# Patient Record
Sex: Female | Born: 1940
Health system: Southern US, Community
[De-identification: ages and names within clinical notes are randomized; demographics above are authoritative.]

## PROBLEM LIST (undated history)

## (undated) DIAGNOSIS — I10 Essential (primary) hypertension: Secondary | ICD-10-CM

## (undated) DIAGNOSIS — R112 Nausea with vomiting, unspecified: Secondary | ICD-10-CM

## (undated) DIAGNOSIS — E119 Type 2 diabetes mellitus without complications: Secondary | ICD-10-CM

## (undated) DIAGNOSIS — N189 Chronic kidney disease, unspecified: Secondary | ICD-10-CM

## (undated) DIAGNOSIS — R42 Dizziness and giddiness: Secondary | ICD-10-CM

## (undated) DIAGNOSIS — Z9889 Other specified postprocedural states: Secondary | ICD-10-CM

## (undated) DIAGNOSIS — T4145XA Adverse effect of unspecified anesthetic, initial encounter: Secondary | ICD-10-CM

## (undated) DIAGNOSIS — T8859XA Other complications of anesthesia, initial encounter: Secondary | ICD-10-CM

## (undated) DIAGNOSIS — M25369 Other instability, unspecified knee: Secondary | ICD-10-CM

## (undated) DIAGNOSIS — E785 Hyperlipidemia, unspecified: Secondary | ICD-10-CM

## (undated) HISTORY — DX: Essential (primary) hypertension: I10

## (undated) HISTORY — DX: Type 2 diabetes mellitus without complications: E11.9

## (undated) HISTORY — DX: Hyperlipidemia, unspecified: E78.5

## (undated) HISTORY — PX: HERNIA REPAIR: SHX51

## (undated) HISTORY — PX: CHOLECYSTECTOMY: SHX55

## (undated) HISTORY — PX: ABDOMINAL HYSTERECTOMY: SHX81

---

## 2004-06-11 ENCOUNTER — Ambulatory Visit: Payer: Self-pay

## 2005-06-19 ENCOUNTER — Ambulatory Visit: Payer: Self-pay

## 2006-08-06 ENCOUNTER — Ambulatory Visit: Payer: Self-pay | Admitting: Internal Medicine

## 2006-08-20 ENCOUNTER — Ambulatory Visit: Payer: Self-pay | Admitting: Family Medicine

## 2007-09-20 ENCOUNTER — Ambulatory Visit: Payer: Self-pay | Admitting: Family Medicine

## 2007-09-22 ENCOUNTER — Ambulatory Visit: Payer: Self-pay | Admitting: Family Medicine

## 2007-09-29 ENCOUNTER — Ambulatory Visit: Payer: Self-pay | Admitting: Nephrology

## 2007-10-27 ENCOUNTER — Ambulatory Visit: Payer: Self-pay | Admitting: Family Medicine

## 2007-11-19 ENCOUNTER — Other Ambulatory Visit: Payer: Self-pay

## 2007-11-19 ENCOUNTER — Ambulatory Visit: Payer: Self-pay | Admitting: General Surgery

## 2007-11-23 ENCOUNTER — Ambulatory Visit: Payer: Self-pay | Admitting: General Surgery

## 2008-04-20 DIAGNOSIS — N183 Chronic kidney disease, stage 3 (moderate): Secondary | ICD-10-CM

## 2008-04-20 DIAGNOSIS — N1831 Chronic kidney disease, stage 3a: Secondary | ICD-10-CM | POA: Insufficient documentation

## 2009-01-05 ENCOUNTER — Ambulatory Visit: Payer: Self-pay | Admitting: Family Medicine

## 2009-05-09 DIAGNOSIS — M8580 Other specified disorders of bone density and structure, unspecified site: Secondary | ICD-10-CM

## 2009-06-25 ENCOUNTER — Ambulatory Visit: Payer: Self-pay | Admitting: Gastroenterology

## 2009-06-25 LAB — HM COLONOSCOPY: HM COLON: NORMAL

## 2009-09-04 ENCOUNTER — Ambulatory Visit: Payer: Self-pay | Admitting: Family Medicine

## 2009-12-02 ENCOUNTER — Ambulatory Visit: Payer: Self-pay | Admitting: General Practice

## 2009-12-13 ENCOUNTER — Ambulatory Visit: Payer: Self-pay | Admitting: General Practice

## 2009-12-21 ENCOUNTER — Ambulatory Visit: Payer: Self-pay | Admitting: General Practice

## 2010-04-19 ENCOUNTER — Ambulatory Visit: Payer: Self-pay | Admitting: Family Medicine

## 2010-12-10 ENCOUNTER — Ambulatory Visit: Payer: Self-pay | Admitting: Internal Medicine

## 2010-12-13 ENCOUNTER — Ambulatory Visit: Payer: Self-pay | Admitting: Internal Medicine

## 2011-01-12 ENCOUNTER — Ambulatory Visit: Payer: Self-pay | Admitting: Internal Medicine

## 2011-02-21 ENCOUNTER — Ambulatory Visit: Payer: Self-pay

## 2011-06-12 ENCOUNTER — Ambulatory Visit: Payer: Self-pay | Admitting: Family Medicine

## 2011-08-07 DIAGNOSIS — I10 Essential (primary) hypertension: Secondary | ICD-10-CM | POA: Diagnosis not present

## 2011-08-07 DIAGNOSIS — E785 Hyperlipidemia, unspecified: Secondary | ICD-10-CM | POA: Diagnosis not present

## 2011-08-07 DIAGNOSIS — R7309 Other abnormal glucose: Secondary | ICD-10-CM | POA: Diagnosis not present

## 2011-08-07 DIAGNOSIS — N189 Chronic kidney disease, unspecified: Secondary | ICD-10-CM | POA: Diagnosis not present

## 2011-12-12 DIAGNOSIS — N189 Chronic kidney disease, unspecified: Secondary | ICD-10-CM | POA: Diagnosis not present

## 2011-12-12 DIAGNOSIS — E785 Hyperlipidemia, unspecified: Secondary | ICD-10-CM | POA: Diagnosis not present

## 2011-12-12 DIAGNOSIS — I10 Essential (primary) hypertension: Secondary | ICD-10-CM | POA: Diagnosis not present

## 2011-12-12 DIAGNOSIS — E8881 Metabolic syndrome: Secondary | ICD-10-CM | POA: Diagnosis not present

## 2012-06-16 DIAGNOSIS — I10 Essential (primary) hypertension: Secondary | ICD-10-CM | POA: Diagnosis not present

## 2012-06-16 DIAGNOSIS — D638 Anemia in other chronic diseases classified elsewhere: Secondary | ICD-10-CM | POA: Diagnosis not present

## 2012-06-16 DIAGNOSIS — R7309 Other abnormal glucose: Secondary | ICD-10-CM | POA: Diagnosis not present

## 2012-06-16 DIAGNOSIS — Z23 Encounter for immunization: Secondary | ICD-10-CM | POA: Diagnosis not present

## 2012-06-16 DIAGNOSIS — D649 Anemia, unspecified: Secondary | ICD-10-CM | POA: Diagnosis not present

## 2012-06-16 DIAGNOSIS — M1710 Unilateral primary osteoarthritis, unspecified knee: Secondary | ICD-10-CM | POA: Diagnosis not present

## 2012-06-16 DIAGNOSIS — E785 Hyperlipidemia, unspecified: Secondary | ICD-10-CM | POA: Diagnosis not present

## 2012-10-15 DIAGNOSIS — E785 Hyperlipidemia, unspecified: Secondary | ICD-10-CM | POA: Diagnosis not present

## 2012-10-15 DIAGNOSIS — I10 Essential (primary) hypertension: Secondary | ICD-10-CM | POA: Diagnosis not present

## 2012-10-15 DIAGNOSIS — E8881 Metabolic syndrome: Secondary | ICD-10-CM | POA: Diagnosis not present

## 2012-10-15 DIAGNOSIS — N189 Chronic kidney disease, unspecified: Secondary | ICD-10-CM | POA: Diagnosis not present

## 2012-10-15 DIAGNOSIS — D638 Anemia in other chronic diseases classified elsewhere: Secondary | ICD-10-CM | POA: Diagnosis not present

## 2013-02-21 DIAGNOSIS — E8881 Metabolic syndrome: Secondary | ICD-10-CM | POA: Diagnosis not present

## 2013-02-21 DIAGNOSIS — E785 Hyperlipidemia, unspecified: Secondary | ICD-10-CM | POA: Diagnosis not present

## 2013-02-21 DIAGNOSIS — I10 Essential (primary) hypertension: Secondary | ICD-10-CM | POA: Diagnosis not present

## 2013-02-21 DIAGNOSIS — N189 Chronic kidney disease, unspecified: Secondary | ICD-10-CM | POA: Diagnosis not present

## 2013-04-29 DIAGNOSIS — Z23 Encounter for immunization: Secondary | ICD-10-CM | POA: Diagnosis not present

## 2013-06-24 DIAGNOSIS — E785 Hyperlipidemia, unspecified: Secondary | ICD-10-CM | POA: Diagnosis not present

## 2013-06-24 DIAGNOSIS — R7309 Other abnormal glucose: Secondary | ICD-10-CM | POA: Diagnosis not present

## 2013-06-24 DIAGNOSIS — N189 Chronic kidney disease, unspecified: Secondary | ICD-10-CM | POA: Diagnosis not present

## 2013-06-24 DIAGNOSIS — I10 Essential (primary) hypertension: Secondary | ICD-10-CM | POA: Diagnosis not present

## 2013-06-24 DIAGNOSIS — Z23 Encounter for immunization: Secondary | ICD-10-CM | POA: Diagnosis not present

## 2013-10-06 ENCOUNTER — Ambulatory Visit: Payer: Self-pay | Admitting: Family Medicine

## 2013-10-06 DIAGNOSIS — Z1231 Encounter for screening mammogram for malignant neoplasm of breast: Secondary | ICD-10-CM | POA: Diagnosis not present

## 2013-10-06 LAB — HM MAMMOGRAPHY: HM Mammogram: NORMAL

## 2013-10-25 DIAGNOSIS — I10 Essential (primary) hypertension: Secondary | ICD-10-CM | POA: Diagnosis not present

## 2013-10-25 DIAGNOSIS — E785 Hyperlipidemia, unspecified: Secondary | ICD-10-CM | POA: Diagnosis not present

## 2013-10-25 DIAGNOSIS — E8881 Metabolic syndrome: Secondary | ICD-10-CM | POA: Diagnosis not present

## 2013-10-25 DIAGNOSIS — N189 Chronic kidney disease, unspecified: Secondary | ICD-10-CM | POA: Diagnosis not present

## 2013-10-25 LAB — LIPID PANEL
Cholesterol: 201 mg/dL — AB (ref 0–200)
HDL: 119 mg/dL — AB (ref 35–70)
LDL Cholesterol: 74 mg/dL
TRIGLYCERIDES: 38 mg/dL — AB (ref 40–160)

## 2013-12-14 DIAGNOSIS — E119 Type 2 diabetes mellitus without complications: Secondary | ICD-10-CM | POA: Diagnosis not present

## 2013-12-14 DIAGNOSIS — N183 Chronic kidney disease, stage 3 unspecified: Secondary | ICD-10-CM | POA: Diagnosis not present

## 2013-12-14 DIAGNOSIS — I129 Hypertensive chronic kidney disease with stage 1 through stage 4 chronic kidney disease, or unspecified chronic kidney disease: Secondary | ICD-10-CM | POA: Diagnosis not present

## 2013-12-16 DIAGNOSIS — H35379 Puckering of macula, unspecified eye: Secondary | ICD-10-CM | POA: Diagnosis not present

## 2014-02-24 DIAGNOSIS — Z23 Encounter for immunization: Secondary | ICD-10-CM | POA: Diagnosis not present

## 2014-02-24 DIAGNOSIS — I10 Essential (primary) hypertension: Secondary | ICD-10-CM | POA: Diagnosis not present

## 2014-02-24 DIAGNOSIS — E1129 Type 2 diabetes mellitus with other diabetic kidney complication: Secondary | ICD-10-CM | POA: Diagnosis not present

## 2014-02-24 DIAGNOSIS — E785 Hyperlipidemia, unspecified: Secondary | ICD-10-CM | POA: Diagnosis not present

## 2014-03-17 DIAGNOSIS — H43819 Vitreous degeneration, unspecified eye: Secondary | ICD-10-CM | POA: Diagnosis not present

## 2014-03-17 DIAGNOSIS — H35379 Puckering of macula, unspecified eye: Secondary | ICD-10-CM | POA: Diagnosis not present

## 2014-04-20 ENCOUNTER — Ambulatory Visit: Payer: Self-pay | Admitting: Ophthalmology

## 2014-04-20 DIAGNOSIS — Z01812 Encounter for preprocedural laboratory examination: Secondary | ICD-10-CM | POA: Diagnosis not present

## 2014-04-20 DIAGNOSIS — Z01810 Encounter for preprocedural cardiovascular examination: Secondary | ICD-10-CM | POA: Diagnosis not present

## 2014-04-20 DIAGNOSIS — I10 Essential (primary) hypertension: Secondary | ICD-10-CM | POA: Diagnosis not present

## 2014-04-20 LAB — POTASSIUM: Potassium: 4.1 mmol/L (ref 3.5–5.1)

## 2014-04-26 ENCOUNTER — Ambulatory Visit: Payer: Self-pay | Admitting: Ophthalmology

## 2014-04-26 DIAGNOSIS — Z7982 Long term (current) use of aspirin: Secondary | ICD-10-CM | POA: Diagnosis not present

## 2014-04-26 DIAGNOSIS — I10 Essential (primary) hypertension: Secondary | ICD-10-CM | POA: Diagnosis not present

## 2014-04-26 DIAGNOSIS — Z79899 Other long term (current) drug therapy: Secondary | ICD-10-CM | POA: Diagnosis not present

## 2014-04-26 DIAGNOSIS — M1990 Unspecified osteoarthritis, unspecified site: Secondary | ICD-10-CM | POA: Diagnosis not present

## 2014-04-26 DIAGNOSIS — E119 Type 2 diabetes mellitus without complications: Secondary | ICD-10-CM | POA: Diagnosis not present

## 2014-04-26 DIAGNOSIS — N289 Disorder of kidney and ureter, unspecified: Secondary | ICD-10-CM | POA: Diagnosis not present

## 2014-04-26 DIAGNOSIS — H35372 Puckering of macula, left eye: Secondary | ICD-10-CM | POA: Diagnosis not present

## 2014-04-26 DIAGNOSIS — H35342 Macular cyst, hole, or pseudohole, left eye: Secondary | ICD-10-CM | POA: Diagnosis not present

## 2014-04-26 DIAGNOSIS — M810 Age-related osteoporosis without current pathological fracture: Secondary | ICD-10-CM | POA: Diagnosis not present

## 2014-04-26 DIAGNOSIS — E780 Pure hypercholesterolemia: Secondary | ICD-10-CM | POA: Diagnosis not present

## 2014-05-03 DIAGNOSIS — H35372 Puckering of macula, left eye: Secondary | ICD-10-CM | POA: Diagnosis not present

## 2014-08-11 DIAGNOSIS — F410 Panic disorder [episodic paroxysmal anxiety] without agoraphobia: Secondary | ICD-10-CM | POA: Diagnosis not present

## 2014-08-11 DIAGNOSIS — N183 Chronic kidney disease, stage 3 (moderate): Secondary | ICD-10-CM | POA: Diagnosis not present

## 2014-08-11 DIAGNOSIS — E1121 Type 2 diabetes mellitus with diabetic nephropathy: Secondary | ICD-10-CM | POA: Diagnosis not present

## 2014-08-11 DIAGNOSIS — I10 Essential (primary) hypertension: Secondary | ICD-10-CM | POA: Diagnosis not present

## 2014-08-11 DIAGNOSIS — D638 Anemia in other chronic diseases classified elsewhere: Secondary | ICD-10-CM | POA: Diagnosis not present

## 2014-08-11 DIAGNOSIS — E785 Hyperlipidemia, unspecified: Secondary | ICD-10-CM | POA: Diagnosis not present

## 2014-08-14 DIAGNOSIS — N183 Chronic kidney disease, stage 3 (moderate): Secondary | ICD-10-CM | POA: Diagnosis not present

## 2014-08-14 DIAGNOSIS — H35372 Puckering of macula, left eye: Secondary | ICD-10-CM | POA: Diagnosis not present

## 2014-08-14 DIAGNOSIS — I10 Essential (primary) hypertension: Secondary | ICD-10-CM | POA: Diagnosis not present

## 2014-08-14 DIAGNOSIS — E1121 Type 2 diabetes mellitus with diabetic nephropathy: Secondary | ICD-10-CM | POA: Diagnosis not present

## 2014-08-14 DIAGNOSIS — E785 Hyperlipidemia, unspecified: Secondary | ICD-10-CM | POA: Diagnosis not present

## 2014-08-14 LAB — HEMOGLOBIN A1C: Hgb A1c MFr Bld: 6.1 % — AB (ref 4.0–6.0)

## 2014-08-21 DIAGNOSIS — R51 Headache: Secondary | ICD-10-CM | POA: Diagnosis not present

## 2014-08-21 DIAGNOSIS — R42 Dizziness and giddiness: Secondary | ICD-10-CM | POA: Diagnosis not present

## 2014-09-01 DIAGNOSIS — H905 Unspecified sensorineural hearing loss: Secondary | ICD-10-CM | POA: Diagnosis not present

## 2014-09-01 DIAGNOSIS — J301 Allergic rhinitis due to pollen: Secondary | ICD-10-CM | POA: Diagnosis not present

## 2014-09-01 DIAGNOSIS — R42 Dizziness and giddiness: Secondary | ICD-10-CM | POA: Diagnosis not present

## 2014-09-01 DIAGNOSIS — H903 Sensorineural hearing loss, bilateral: Secondary | ICD-10-CM | POA: Diagnosis not present

## 2014-09-01 DIAGNOSIS — H9319 Tinnitus, unspecified ear: Secondary | ICD-10-CM | POA: Diagnosis not present

## 2014-11-04 NOTE — Op Note (Signed)
PATIENT NAME:  Melanie Gray, Melanie Gray DATE OF BIRTH:  [birth year]  DATE OF PROCEDURE:  04/26/2014  PREOPERATIVE DIAGNOSIS: Epiretinal membrane, left eye.   POSTOPERATIVE DIAGNOSIS: Epiretinal membrane, left eye.   PROCEDURE: A 25-gauge pars plana vitrectomy, ICG, ERM, ILM peel, and partial air-fluid exchange, left eye.   ANESTHESIA: General.   ESTIMATED BLOOD LOSS: Minimal.   COMPLICATIONS: None.  DESCRIPTION OF PROCEDURE: The patient was evaluated in clinic for an epiretinal membrane in the left eye. The risks, benefits, alternatives, and complications were discussed with the patient and decision was made to proceed with epiretinal membrane removal surgery. The patient was greeted in the preoperative holding area. Any questions were answered. The site was marked. The patient was brought to the Operating Room in supine position and placed under general anesthesia. The left eye was then prepped and draped in the usual sterile fashion. A 25-gauge trocar system was placed in the usual position and the infusion cannula was checked prior to starting the infusion. That was in the vitreous cavity. A core vitrectomy was performed with elevation of the posterior hyaloid and the vitreous was shortened 360 degrees. ICG was then applied to the retinal surface followed by aspiration of the ICG. Attention was then drawn to the epiretinal membrane, which was peeled along with the internal limiting membrane for an area of about 1 disk diameter 360 degrees around the fovea. The periphery was then inspected using a wide angle viewing system. There were no retinal tears 360 degrees. A partial air-fluid exchange was then performed and the eye was left with a partial air bubble. The trocars were then removed and ensured to be watertight. A peribulbar block was given along with subconjunctival cefuroxime and dexamethasone. The eye was then patched and shielded with Neo-Poly-Dex ointment and the patient was brought out  of general anesthesia and taken to recovery area in stable condition.     ____________________________ Princella PellegriniJessica D. Duke Salviaandolph, MD jdr:at D: 04/26/2014 10:20:31 ET T: 04/26/2014 14:14:40 ET JOB#: [id number]  cc: Shanda BumpsJessica D. Duke Salviaandolph, MD, <Dictator> Dartha LodgeJESSICA D [place] MD ELECTRONICALLY SIGNED 05/10/2014 7:41

## 2014-11-06 ENCOUNTER — Encounter: Payer: Self-pay | Admitting: Family Medicine

## 2014-11-06 DIAGNOSIS — L219 Seborrheic dermatitis, unspecified: Secondary | ICD-10-CM | POA: Insufficient documentation

## 2014-11-06 DIAGNOSIS — K7689 Other specified diseases of liver: Secondary | ICD-10-CM | POA: Insufficient documentation

## 2014-11-06 DIAGNOSIS — R51 Headache: Secondary | ICD-10-CM

## 2014-11-06 DIAGNOSIS — I10 Essential (primary) hypertension: Secondary | ICD-10-CM | POA: Insufficient documentation

## 2014-11-06 DIAGNOSIS — E785 Hyperlipidemia, unspecified: Secondary | ICD-10-CM | POA: Insufficient documentation

## 2014-11-06 DIAGNOSIS — E1129 Type 2 diabetes mellitus with other diabetic kidney complication: Secondary | ICD-10-CM | POA: Insufficient documentation

## 2014-11-06 DIAGNOSIS — IMO0001 Reserved for inherently not codable concepts without codable children: Secondary | ICD-10-CM | POA: Insufficient documentation

## 2014-11-06 DIAGNOSIS — D649 Anemia, unspecified: Secondary | ICD-10-CM | POA: Insufficient documentation

## 2014-11-06 DIAGNOSIS — H905 Unspecified sensorineural hearing loss: Secondary | ICD-10-CM | POA: Insufficient documentation

## 2014-11-06 DIAGNOSIS — R519 Headache, unspecified: Secondary | ICD-10-CM | POA: Insufficient documentation

## 2014-11-06 DIAGNOSIS — R42 Dizziness and giddiness: Secondary | ICD-10-CM | POA: Insufficient documentation

## 2014-11-06 DIAGNOSIS — F410 Panic disorder [episodic paroxysmal anxiety] without agoraphobia: Secondary | ICD-10-CM | POA: Insufficient documentation

## 2014-11-06 DIAGNOSIS — J309 Allergic rhinitis, unspecified: Secondary | ICD-10-CM | POA: Insufficient documentation

## 2014-11-06 DIAGNOSIS — M4800 Spinal stenosis, site unspecified: Secondary | ICD-10-CM | POA: Insufficient documentation

## 2014-11-06 DIAGNOSIS — M1710 Unilateral primary osteoarthritis, unspecified knee: Secondary | ICD-10-CM | POA: Insufficient documentation

## 2014-11-06 DIAGNOSIS — M179 Osteoarthritis of knee, unspecified: Secondary | ICD-10-CM | POA: Insufficient documentation

## 2014-11-06 DIAGNOSIS — H9313 Tinnitus, bilateral: Secondary | ICD-10-CM | POA: Insufficient documentation

## 2014-11-06 DIAGNOSIS — G2581 Restless legs syndrome: Secondary | ICD-10-CM | POA: Insufficient documentation

## 2014-11-22 DIAGNOSIS — J302 Other seasonal allergic rhinitis: Secondary | ICD-10-CM | POA: Diagnosis not present

## 2014-11-22 DIAGNOSIS — E1121 Type 2 diabetes mellitus with diabetic nephropathy: Secondary | ICD-10-CM | POA: Diagnosis not present

## 2014-11-22 DIAGNOSIS — N183 Chronic kidney disease, stage 3 (moderate): Secondary | ICD-10-CM | POA: Diagnosis not present

## 2014-11-22 DIAGNOSIS — E785 Hyperlipidemia, unspecified: Secondary | ICD-10-CM | POA: Diagnosis not present

## 2014-11-22 DIAGNOSIS — I10 Essential (primary) hypertension: Secondary | ICD-10-CM | POA: Diagnosis not present

## 2014-11-22 DIAGNOSIS — Z23 Encounter for immunization: Secondary | ICD-10-CM | POA: Diagnosis not present

## 2015-03-20 ENCOUNTER — Other Ambulatory Visit: Payer: Self-pay | Admitting: Family Medicine

## 2015-03-26 ENCOUNTER — Encounter: Payer: Self-pay | Admitting: Family Medicine

## 2015-03-26 ENCOUNTER — Ambulatory Visit (INDEPENDENT_AMBULATORY_CARE_PROVIDER_SITE_OTHER): Payer: Medicare Other | Admitting: Family Medicine

## 2015-03-26 VITALS — BP 128/68 | HR 83 | Temp 97.6°F | Resp 16 | Ht 65.0 in | Wt 151.7 lb

## 2015-03-26 DIAGNOSIS — Z23 Encounter for immunization: Secondary | ICD-10-CM

## 2015-03-26 DIAGNOSIS — N189 Chronic kidney disease, unspecified: Secondary | ICD-10-CM | POA: Diagnosis not present

## 2015-03-26 DIAGNOSIS — E785 Hyperlipidemia, unspecified: Secondary | ICD-10-CM

## 2015-03-26 DIAGNOSIS — N183 Chronic kidney disease, stage 3 unspecified: Secondary | ICD-10-CM

## 2015-03-26 DIAGNOSIS — E1122 Type 2 diabetes mellitus with diabetic chronic kidney disease: Secondary | ICD-10-CM

## 2015-03-26 DIAGNOSIS — M170 Bilateral primary osteoarthritis of knee: Secondary | ICD-10-CM | POA: Diagnosis not present

## 2015-03-26 DIAGNOSIS — I10 Essential (primary) hypertension: Secondary | ICD-10-CM

## 2015-03-26 LAB — POCT UA - MICROALBUMIN

## 2015-03-26 LAB — POCT GLYCOSYLATED HEMOGLOBIN (HGB A1C): Hemoglobin A1C: 6.1

## 2015-03-26 MED ORDER — PRAVASTATIN SODIUM 40 MG PO TABS: 40.0000 mg | ORAL_TABLET | Freq: Every day | ORAL | Status: DC

## 2015-03-26 MED ORDER — VALSARTAN-HYDROCHLOROTHIAZIDE 160-12.5 MG PO TABS
1.0000 | ORAL_TABLET | Freq: Every day | ORAL | Status: DC
Start: 2015-03-26 — End: 2015-10-12

## 2015-03-26 NOTE — Progress Notes (Signed)
Name: Melanie Gray   MRN: [id number]    DOB: Jan 11, 1941   Date:03/26/2015       Progress Note  Subjective  Chief Complaint  Chief Complaint  Patient presents with  . Medication Refill    follow-up  . Hypertension  . Hyperlipidemia    HPI  DMII with CKI III: she has been doing well, no polyphagia, polydipsia or polyuria, glucose at home has been at goal, around 90-105  at home. On diet only. Also takes aspirin daily.   CKIIII she sees nephrologist yearly now. We will recheck urine micro and CBC, SMA-7 today, also vitamin D    Hyperlipidemia: taking Pravastatin and denies side effects  HTN: taking medication, denies side effects of medication. No dizziness, except when she looks up for a prolonged period of time.   OA: worse on right knee, uses a cane to assist with ambulation.   Patient Active Problem List   Diagnosis Date Noted  . Benign hypertension 11/06/2014  . Chronic anemia 11/06/2014  . Seborrheic dermatitis 11/06/2014  . Diabetes mellitus with renal manifestation 11/06/2014  . Dyslipidemia 11/06/2014  . Cephalalgia 11/06/2014  . Deafness, sensorineural 11/06/2014  . Liver regeneration 11/06/2014  . Arthritis of knee, degenerative 11/06/2014  . Panic attack 11/06/2014  . Restless legs syndrome 11/06/2014  . Allergic rhinitis 11/06/2014  . Spinal stenosis 11/06/2014  . Osteopenia 05/09/2009  . Chronic kidney disease (CKD), stage III (moderate) 04/20/2008    No past surgical history on file.  No family history on file.  Social History   Social History  . Marital Status: Widowed    Spouse Name: N/A  . Number of Children: N/A  . Years of Education: N/A   Occupational History  . unemployed    Social History Main Topics  . Smoking status: Never Smoker   . Smokeless tobacco: Never Used  . Alcohol Use: No  . Drug Use: No  . Sexual Activity: No   Other Topics Concern  . Not on file   Social History Narrative     Current outpatient prescriptions:  .   aspirin EC 81 MG tablet, Take 81 mg by mouth daily., Disp: , Rfl:  .  Cholecalciferol (VITAMIN D) 2000 UNITS tablet, Take 1 tablet by mouth daily., Disp: , Rfl:  .  Glucosamine-Chondroitin 250-200 MG TABS, Take 1 tablet by mouth daily., Disp: , Rfl:  .  Multiple Vitamin (MULTI-VITAMINS) TABS, Take 1 tablet by mouth daily., Disp: , Rfl:  .  pravastatin (PRAVACHOL) 40 MG tablet, Take 1 tablet (40 mg total) by mouth daily., Disp: 90 tablet, Rfl: 1 .  valsartan-hydrochlorothiazide (DIOVAN-HCT) 160-12.5 MG per tablet, Take 1 tablet by mouth daily., Disp: 90 tablet, Rfl: 1  Allergies  Allergen Reactions  . Sulfa Antibiotics Rash     ROS  Constitutional: Negative for fever or significant weight change.  Respiratory: Negative for cough and shortness of breath.   Cardiovascular: Negative for chest pain or palpitations.  Gastrointestinal: Negative for abdominal pain, no bowel changes.  Musculoskeletal: Positive  for gait problem or joint swelling.  Skin: Negative for rash.  Neurological: Negative for dizziness or headache.  No other specific complaints in a complete review of systems (except as listed in HPI above).  Objective  Filed Vitals:   03/26/15 1007  BP: 128/68  Pulse: 83  Temp: 97.6 F (36.4 C)  TempSrc: Oral  Resp: 16  Height: 5\' 5"  (1.651 m)  Weight: 151 lb 11.2 oz (68.811 kg)  SpO2: 98%  Body mass index is 25.24 kg/(m^2).  Physical Exam  Constitutional: Patient appears well-developed and well-nourished. Obese  No distress.  HEENT: head atraumatic, normocephalic, pupils equal and reactive to light, neck supple, throat within normal limits Cardiovascular: Normal rate, regular rhythm and normal heart sounds.  No murmur heard. No BLE edema. Pulmonary/Chest: Effort normal and breath sounds normal. No respiratory distress. Abdominal: Soft.  There is no tenderness. Psychiatric: Patient has a normal mood and affect. behavior is normal. Judgment and thought content  normal. Muscular Skeletal: crepitus with extension of both knees, no effusion, uses cane to assist with ambulation  Diabetic Foot Exam: Diabetic Foot Exam - Simple   Simple Foot Form  Visual Inspection  See comments:  Yes  Sensation Testing  Intact to touch and monofilament testing bilaterally:  Yes  Pulse Check  Posterior Tibialis and Dorsalis pulse intact bilaterally:  Yes  Comments  Corn formation, thick toenails.        PHQ2/9: Depression screen PHQ 2/9 03/26/2015  Decreased Interest 0  Down, Depressed, Hopeless 0  PHQ - 2 Score 0    Fall Risk: Fall Risk  03/26/2015  Falls in the past year? Yes  Number falls in past yr: 1  Injury with Fall? No     Functional Status Survey: Is the patient deaf or have difficulty hearing?: No Does the patient have difficulty seeing, even when wearing glasses/contacts?: Yes (glassses) Does the patient have difficulty concentrating, remembering, or making decisions?: No Does the patient have difficulty walking or climbing stairs?: No Does the patient have difficulty dressing or bathing?: No Does the patient have difficulty doing errands alone such as visiting a doctor's office or shopping?: No   Assessment & Plan  1. Type 2 diabetes mellitus with diabetic chronic kidney disease Continue diet, hgbA1C is at goal at 6.1 - POCT HgB A1C  2. Needs flu shot  - Flu vaccine HIGH DOSE PF (Fluzone High dose)  3. Primary osteoarthritis of both knees Not significant pain, just has instability and uses cane  4. Dyslipidemia Recheck yearly  - pravastatin (PRAVACHOL) 40 MG tablet; Take 1 tablet (40 mg total) by mouth daily.  Dispense: 90 tablet; Refill: 1  5. Chronic kidney disease (CKD), stage III (moderate) -comp panel - CBC with Differential/Platelet - Vit D  25 hydroxy (rtn osteoporosis monitoring)  6. Need for pneumococcal vaccination  - Pneumococcal conjugate vaccine 13-valent IM  7. Hypertension, benign At goal, denies  orthostatic changes - valsartan-hydrochlorothiazide (DIOVAN-HCT) 160-12.5 MG per tablet; Take 1 tablet by mouth daily.  Dispense: 90 tablet; Refill: 1 - Comprehensive metabolic panel

## 2015-03-26 NOTE — Addendum Note (Signed)
Addended by: Marcos Eke C on: 03/26/2015 11:11 AM   Modules accepted: Orders

## 2015-03-27 LAB — COMPREHENSIVE METABOLIC PANEL
A/G RATIO: 1.6 (ref 1.1–2.5)
ALBUMIN: 4.5 g/dL (ref 3.5–4.8)
ALT: 15 IU/L (ref 0–32)
AST: 22 IU/L (ref 0–40)
Alkaline Phosphatase: 85 IU/L (ref 39–117)
BUN / CREAT RATIO: 21 (ref 11–26)
BUN: 29 mg/dL — ABNORMAL HIGH (ref 8–27)
Bilirubin Total: 0.4 mg/dL (ref 0.0–1.2)
CALCIUM: 10 mg/dL (ref 8.7–10.3)
CO2: 23 mmol/L (ref 18–29)
Chloride: 105 mmol/L (ref 97–108)
Creatinine, Ser: 1.35 mg/dL — ABNORMAL HIGH (ref 0.57–1.00)
GFR, EST AFRICAN AMERICAN: 44 mL/min/{1.73_m2} — AB (ref >59)
GFR, EST NON AFRICAN AMERICAN: 38 mL/min/{1.73_m2} — AB (ref >59)
GLOBULIN, TOTAL: 2.9 g/dL (ref 1.5–4.5)
Glucose: 109 mg/dL — ABNORMAL HIGH (ref 65–99)
POTASSIUM: 4.9 mmol/L (ref 3.5–5.2)
SODIUM: 145 mmol/L — AB (ref 134–144)
TOTAL PROTEIN: 7.4 g/dL (ref 6.0–8.5)

## 2015-03-27 LAB — CBC WITH DIFFERENTIAL/PLATELET
Basophils Absolute: 0 x10E3/uL (ref 0.0–0.2)
Basos: 0 %
EOS (ABSOLUTE): 0 x10E3/uL (ref 0.0–0.4)
Eos: 1 %
Hematocrit: 35.2 % (ref 34.0–46.6)
Hemoglobin: 11.6 g/dL (ref 11.1–15.9)
Immature Grans (Abs): 0 x10E3/uL (ref 0.0–0.1)
Immature Granulocytes: 0 %
Lymphocytes Absolute: 1.6 x10E3/uL (ref 0.7–3.1)
Lymphs: 28 %
MCH: 28.2 pg (ref 26.6–33.0)
MCHC: 33 g/dL (ref 31.5–35.7)
MCV: 86 fL (ref 79–97)
Monocytes Absolute: 0.4 x10E3/uL (ref 0.1–0.9)
Monocytes: 8 %
Neutrophils Absolute: 3.5 x10E3/uL (ref 1.4–7.0)
Neutrophils: 63 %
Platelets: 241 x10E3/uL (ref 150–379)
RBC: 4.11 x10E6/uL (ref 3.77–5.28)
RDW: 14.9 % (ref 12.3–15.4)
WBC: 5.5 x10E3/uL (ref 3.4–10.8)

## 2015-03-27 LAB — VITAMIN D 25 HYDROXY (VIT D DEFICIENCY, FRACTURES): Vit D, 25-Hydroxy: 38.9 ng/mL (ref 30.0–100.0)

## 2015-03-30 NOTE — Progress Notes (Signed)
Patient notified

## 2015-07-27 ENCOUNTER — Encounter: Payer: Self-pay | Admitting: Family Medicine

## 2015-07-27 ENCOUNTER — Ambulatory Visit (INDEPENDENT_AMBULATORY_CARE_PROVIDER_SITE_OTHER): Payer: Medicare Other | Admitting: Family Medicine

## 2015-07-27 VITALS — BP 118/72 | HR 95 | Temp 97.6°F | Resp 16 | Wt 149.6 lb

## 2015-07-27 DIAGNOSIS — I10 Essential (primary) hypertension: Secondary | ICD-10-CM

## 2015-07-27 DIAGNOSIS — Z0000 Encounter for general adult medical examination without abnormal findings: Secondary | ICD-10-CM

## 2015-07-27 DIAGNOSIS — N183 Chronic kidney disease, stage 3 (moderate): Secondary | ICD-10-CM | POA: Diagnosis not present

## 2015-07-27 DIAGNOSIS — Z1239 Encounter for other screening for malignant neoplasm of breast: Secondary | ICD-10-CM | POA: Diagnosis not present

## 2015-07-27 DIAGNOSIS — E1122 Type 2 diabetes mellitus with diabetic chronic kidney disease: Secondary | ICD-10-CM

## 2015-07-27 DIAGNOSIS — E2839 Other primary ovarian failure: Secondary | ICD-10-CM | POA: Diagnosis not present

## 2015-07-27 LAB — POCT GLYCOSYLATED HEMOGLOBIN (HGB A1C): Hemoglobin A1C: 5.5

## 2015-07-27 NOTE — Progress Notes (Signed)
Name: Melanie Gray   MRN: [id number]    DOB: [birth year]   Date:07/27/2015       Progress Note  Subjective  Chief Complaint  Chief Complaint  Patient presents with  . Medicare Wellness    HPI  Functional ability/safety issues: No Issues Hearing issues: Addressed  Activities of daily living: Discussed Home safety issues: No Issues  End Of Life Planning: Offered verbal information regarding advanced directives, healthcare power of attorney.  Preventative care, Health maintenance, Preventative health measures discussed.  Preventative screenings discussed today: lab work, colonoscopy, PAP, mammogram, DEXA.  Low Dose CT Chest recommended if Age 75-80 years, 30 pack-year currently smoking OR have quit w/in 15years.   Lifestyle risk factor issued reviewed: Diet, exercise, weight management, advised patient smoking is not healthy, nutrition/diet.  Preventative health measures discussed (5-10 year plan).  Reviewed and recommended vaccinations: - Pneumovax  - Prevnar  - Annual Influenza - Zostavax - Tdap   Depression screening: Done Fall risk screening: Done Discuss ADLs/IADLs: Done  Current medical providers: See HPI  Other health risk factors identified this visit: No other issues Cognitive impairment issues: None identified  All above discussed with patient. Appropriate education, counseling and referral will be made based upon the above.   HTN: she asked to change from ACE to ARB and Valsartan/HCTZ but is too expensive, she will check with pharmacist what would be cheaper. She denies side effects of medication, no chest pain or palpitation.   Diabetes with renal manifestation: GFR stable, on ARB, she denies polyphagia, polydipsia or polyuria. She is on diet only. We will get copy of eye exam from [facility].   Hyperlipidemia: taking Pravastatin, denies myalgia , compliant with medication. Reviewed last labs with patient today    Patient Active Problem List   Diagnosis Date Noted  . Benign hypertension 11/06/2014  . Chronic anemia 11/06/2014  . Seborrheic dermatitis 11/06/2014  . Diabetes mellitus with renal manifestation (HCC) 11/06/2014  . Dyslipidemia 11/06/2014  . Cephalalgia 11/06/2014  . Deafness, sensorineural 11/06/2014  . Liver regeneration 11/06/2014  . Arthritis of knee, degenerative 11/06/2014  . Panic attack 11/06/2014  . Restless legs syndrome 11/06/2014  . Allergic rhinitis 11/06/2014  . Spinal stenosis 11/06/2014  . Osteopenia 05/09/2009  . Chronic kidney disease (CKD), stage III (moderate) 04/20/2008    No past surgical history on file.  No family history on file.  Social History   Social History  . Marital Status: Widowed    Spouse Name: N/A  . Number of Children: N/A  . Years of Education: N/A   Occupational History  . unemployed    Social History Main Topics  . Smoking status: Never Smoker   . Smokeless tobacco: Never Used  . Alcohol Use: No  . Drug Use: No  . Sexual Activity: No   Other Topics Concern  . Not on file   Social History Narrative     Current outpatient prescriptions:  .  aspirin EC 81 MG tablet, Take 81 mg by mouth daily., Disp: , Rfl:  .  Cholecalciferol (VITAMIN D) 2000 UNITS tablet, Take 1 tablet by mouth daily., Disp: , Rfl:  .  Glucosamine-Chondroitin 250-200 MG TABS, Take 1 tablet by mouth daily., Disp: , Rfl:  .  Multiple Vitamin (MULTI-VITAMINS) TABS, Take 1 tablet by mouth daily., Disp: , Rfl:  .  pravastatin (PRAVACHOL) 40 MG tablet, Take 1 tablet (40 mg total) by mouth daily., Disp: 90 tablet, Rfl: 1 .  valsartan-hydrochlorothiazide (DIOVAN-HCT) 160-12.5 MG per  tablet, Take 1 tablet by mouth daily., Disp: 90 tablet, Rfl: 1  Allergies  Allergen Reactions  . Sulfa Antibiotics Rash     ROS  Constitutional: Negative for fever or significant weight change.  Respiratory: Negative for cough and shortness of breath.   Cardiovascular: Negative for chest pain or  palpitations.  Gastrointestinal: Negative for abdominal pain, no bowel changes.  Musculoskeletal: Positive  for gait problem no  joint swelling.  Skin: Negative for rash.  Neurological: Negative for dizziness or headache.  No other specific complaints in a complete review of systems (except as listed in HPI above).  Objective  Filed Vitals:   07/27/15 1059  BP: 118/72  Pulse: 95  Temp: 97.6 F (36.4 C)  TempSrc: Oral  Resp: 16  Weight: 149 lb 9.6 oz (67.858 kg)  SpO2: 97%    Body mass index is 24.89 kg/(m^2).  Physical Exam  Constitutional: Patient appears well-developed and well-nourished. No distress.  HENT: Head: Normocephalic and atraumatic. Ears: B TMs ok, no erythema or effusion; Nose: Nose normal. Mouth/Throat: Oropharynx is clear and moist. No oropharyngeal exudate.  Eyes: Conjunctivae and EOM are normal. Pupils are equal, round, and reactive to light. No scleral icterus.  Neck: Normal range of motion. Neck supple. No JVD present. No thyromegaly present.  Cardiovascular: Normal rate, regular rhythm and normal heart sounds.  No murmur heard. No BLE edema. Pulmonary/Chest: Effort normal and breath sounds normal. No respiratory distress. Abdominal: Soft. Bowel sounds are normal, no distension. There is no tenderness. no masses Breast: no lumps or masses, no nipple discharge or rashes FEMALE GENITALIA:  Not done RECTAL: not done Musculoskeletal: Normal range of motion, no joint effusions. No gross deformities Neurological: he is alert and oriented to person, place, and time. No cranial nerve deficit. Coordination, balance compromised because of her decrease in strenth, she uses a cane to assist with her gait Skin: Skin is warm and dry. No rash noted. No erythema.  Psychiatric: Patient has a normal mood and affect. behavior is normal. Judgment and thought content normal.  PHQ2/9: Depression screen [facility] 2/9 07/27/2015 03/26/2015  Decreased Interest 0 0  Down, Depressed,  Hopeless 0 0  PHQ - 2 Score 0 0     Fall Risk: Fall Risk  07/27/2015 03/26/2015  Falls in the past year? Yes Yes  Number falls in past yr: 1 1  Injury with Fall? No No  Risk for fall due to : Impaired balance/gait -     Functional Status Survey: Is the patient deaf or have difficulty hearing?: No Does the patient have difficulty seeing, even when wearing glasses/contacts?: Yes (patient has to have cataract surgery (seen at [facility])) Does the patient have difficulty concentrating, remembering, or making decisions?: No Does the patient have difficulty walking or climbing stairs?: Yes (patient has issues with right knee) Does the patient have difficulty dressing or bathing?: No Does the patient have difficulty doing errands alone such as visiting a doctor's office or shopping?: No    Assessment & Plan  1. Medicare annual wellness visit, subsequent  Discussed importance of 150 minutes of physical activity weekly, eat two servings of fish weekly, eat one serving of tree nuts ( cashews, pistachios, pecans, almonds.Marland Kitchen) every other day, eat 6 servings of fruit/vegetables daily and drink plenty of water and avoid sweet beverages.   2. Type 2 diabetes mellitus with stage 3 chronic kidney disease, without long-term current use of insulin (HCC)  - POCT HgB A1C  3. Hypertension, benign  At goal, she will contact me about the medication she was to switch to  4. Breast cancer screening  - MM Digital Screening; Future  5. Ovarian failure  - DG Bone Density; Future

## 2015-09-21 ENCOUNTER — Encounter: Payer: Self-pay | Admitting: *Deleted

## 2015-09-26 ENCOUNTER — Ambulatory Visit: Payer: Medicare Other | Admitting: Family Medicine

## 2015-10-12 ENCOUNTER — Other Ambulatory Visit: Payer: Self-pay | Admitting: Family Medicine

## 2015-11-14 ENCOUNTER — Ambulatory Visit
Admission: RE | Admit: 2015-11-14 | Discharge: 2015-11-14 | Disposition: A | Discharge status: Home / Self Care | Payer: Medicare Other | Source: Ambulatory Visit | Attending: Family Medicine | Admitting: Family Medicine

## 2015-11-14 ENCOUNTER — Other Ambulatory Visit: Payer: Self-pay | Admitting: Family Medicine

## 2015-11-14 DIAGNOSIS — Z1231 Encounter for screening mammogram for malignant neoplasm of breast: Secondary | ICD-10-CM | POA: Insufficient documentation

## 2015-11-14 DIAGNOSIS — M8580 Other specified disorders of bone density and structure, unspecified site: Secondary | ICD-10-CM | POA: Insufficient documentation

## 2015-11-14 DIAGNOSIS — M85852 Other specified disorders of bone density and structure, left thigh: Secondary | ICD-10-CM | POA: Diagnosis not present

## 2015-11-14 DIAGNOSIS — Z13820 Encounter for screening for osteoporosis: Secondary | ICD-10-CM | POA: Insufficient documentation

## 2015-11-14 DIAGNOSIS — Z1239 Encounter for other screening for malignant neoplasm of breast: Secondary | ICD-10-CM

## 2015-11-14 DIAGNOSIS — E2839 Other primary ovarian failure: Secondary | ICD-10-CM

## 2015-11-15 ENCOUNTER — Telehealth: Payer: Self-pay | Admitting: Family Medicine

## 2015-11-15 NOTE — Telephone Encounter (Signed)
PT SAID THAT SHE THINKS THAT YOU ALL CALLED HER ABOUT HER BONE DENSITY TEST. PLEASE CALL HER BACK

## 2015-11-26 ENCOUNTER — Encounter: Payer: Self-pay | Admitting: Family Medicine

## 2015-11-26 ENCOUNTER — Ambulatory Visit (INDEPENDENT_AMBULATORY_CARE_PROVIDER_SITE_OTHER): Payer: Medicare Other | Admitting: Family Medicine

## 2015-11-26 VITALS — BP 116/68 | HR 85 | Temp 97.5°F | Resp 16 | Ht 65.0 in | Wt 144.4 lb

## 2015-11-26 DIAGNOSIS — E785 Hyperlipidemia, unspecified: Secondary | ICD-10-CM | POA: Diagnosis not present

## 2015-11-26 DIAGNOSIS — N183 Chronic kidney disease, stage 3 unspecified: Secondary | ICD-10-CM

## 2015-11-26 DIAGNOSIS — R634 Abnormal weight loss: Secondary | ICD-10-CM | POA: Diagnosis not present

## 2015-11-26 DIAGNOSIS — I10 Essential (primary) hypertension: Secondary | ICD-10-CM | POA: Diagnosis not present

## 2015-11-26 DIAGNOSIS — E1122 Type 2 diabetes mellitus with diabetic chronic kidney disease: Secondary | ICD-10-CM

## 2015-11-26 LAB — POCT UA - MICROALBUMIN: Microalbumin Ur, POC: 20 mg/L

## 2015-11-26 LAB — POCT GLYCOSYLATED HEMOGLOBIN (HGB A1C): HEMOGLOBIN A1C: 6

## 2015-11-26 MED ORDER — VALSARTAN 80 MG PO TABS: 80.0000 mg | ORAL_TABLET | Freq: Every day | ORAL | Status: DC

## 2015-11-26 NOTE — Patient Instructions (Signed)
Glucose fasting can go up to 140  BP should be between 120's-130's Change Diovan/HCTZ to half pill until you get new prescription and can take one pill daily after that.

## 2015-11-26 NOTE — Progress Notes (Signed)
Name: Melanie Gray   MRN: [id number]    DOB: [birth year]   Date:11/26/2015       Progress Note  Subjective  Chief Complaint  Chief Complaint  Patient presents with  . Medication Refill    4 month F/U  . Diabetes    Checks BS 1x weekly, Highest-104 Lowest-95, Patient would like to discuss food groups to eat and what to watch out for.  . Hypertension  . Hyperlipidemia    HPI  HTN: she is taking Diovan/HCTZ as prescribed, but bp has been towards low end of normal. Discussed that because of her age I prefer her BP to be in the 130's.   Diabetes with renal manifestation: GFR stable, on ARB, she denies polyphagia, polydipsia or polyuria. She is on diet only. She has been very concerned about her DM, avoiding sweets, carbohydrates and reads labels on everything. She has lost weight. She states not for lack of appetite, just worried about DM getting out of control, like her husband's Explained glucose can go up to 140 fasting. She needs to eat  Hyperlipidemia: taking Pravastatin, denies myalgia , compliant with medication.    Patient Active Problem List   Diagnosis Date Noted  . Benign hypertension 11/06/2014  . Chronic anemia 11/06/2014  . Seborrheic dermatitis 11/06/2014  . Diabetes mellitus with renal manifestation (HCC) 11/06/2014  . Dyslipidemia 11/06/2014  . Cephalalgia 11/06/2014  . Deafness, sensorineural 11/06/2014  . Liver regeneration 11/06/2014  . Arthritis of knee, degenerative 11/06/2014  . Panic attack 11/06/2014  . Restless legs syndrome 11/06/2014  . Allergic rhinitis 11/06/2014  . Spinal stenosis 11/06/2014  . Osteopenia 05/09/2009  . Chronic kidney disease (CKD), stage III (moderate) 04/20/2008    Past Surgical History  Procedure Laterality Date  . Cholecystectomy    . Abdominal hysterectomy    . Hernia repair      Family History  Problem Relation Age of Onset  . Breast cancer Neg Hx   . Cancer Mother   . Cancer Sister     Social History   Social  History  . Marital Status: Widowed    Spouse Name: N/A  . Number of Children: N/A  . Years of Education: N/A   Occupational History  . unemployed    Social History Main Topics  . Smoking status: Never Smoker   . Smokeless tobacco: Never Used  . Alcohol Use: No  . Drug Use: No  . Sexual Activity: No   Other Topics Concern  . Not on file   Social History Narrative     Current outpatient prescriptions:  .  aspirin EC 81 MG tablet, Take 81 mg by mouth daily., Disp: , Rfl:  .  Cholecalciferol (VITAMIN D) 2000 UNITS tablet, Take 1 tablet by mouth daily., Disp: , Rfl:  .  Glucosamine-Chondroitin 250-200 MG TABS, Take 1 tablet by mouth daily., Disp: , Rfl:  .  Multiple Vitamin (MULTI-VITAMINS) TABS, Take 1 tablet by mouth daily., Disp: , Rfl:  .  pravastatin (PRAVACHOL) 40 MG tablet, TAKE 1 TABLET (40 MG TOTAL) BY MOUTH DAILY., Disp: 90 tablet, Rfl: 1 .  valsartan (DIOVAN) 80 MG tablet, Take 1 tablet (80 mg total) by mouth daily., Disp: 90 tablet, Rfl: 0  Allergies  Allergen Reactions  . Sulfa Antibiotics Rash     ROS  Constitutional: Negative for fever , positive for weight change.  Respiratory: Negative for cough and shortness of breath.   Cardiovascular: Negative for chest pain or palpitations.  Gastrointestinal: Negative for  abdominal pain, no bowel changes.  Musculoskeletal: Positive  for gait problem ( secondary to knee problems ) or joint swelling.  Skin: Negative for rash.  Neurological: Negative for dizziness or headache.  No other specific complaints in a complete review of systems (except as listed in HPI above).   Objective  Filed Vitals:   11/26/15 1058  BP: 116/68  Pulse: 85  Temp: 97.5 F (36.4 C)  TempSrc: Oral  Resp: 16  Height:  (1.651 m)  Weight: 144 lb 6.4 oz (65.499 kg)  SpO2: 96%    Body mass index is 24.03 kg/(m^2).  Physical Exam  Constitutional: Patient appears well-developed and well-nourished. Obese No distress.  HEENT: head  atraumatic, normocephalic, pupils equal and reactive to light,  neck supple, throat within normal limits Cardiovascular: Normal rate, regular rhythm and normal heart sounds.  No murmur heard. No BLE edema. Pulmonary/Chest: Effort normal and breath sounds normal. No respiratory distress. Abdominal: Soft.  There is no tenderness. Psychiatric: Patient has a normal mood and affect. behavior is normal. Judgment and thought content normal.  Recent Results (from the past 2160 hour(s))  POCT glycosylated hemoglobin (Hb A1C)     Status: Abnormal   Collection Time: 11/26/15 11:14 AM  Result Value Ref Range   Hemoglobin A1C 6.0   POCT UA - Microalbumin     Status: Abnormal   Collection Time: 11/26/15 11:14 AM  Result Value Ref Range   Microalbumin Ur, POC 20 mg/L   Creatinine, POC  mg/dL   Albumin/Creatinine Ratio, Urine, POC      PHQ2/9: Depression screen [facility] 2/9 11/26/2015 07/27/2015 03/26/2015  Decreased Interest 0 0 0  Down, Depressed, Hopeless 0 0 0  PHQ - 2 Score 0 0 0     Fall Risk: Fall Risk  11/26/2015 07/27/2015 03/26/2015  Falls in the past year? No Yes Yes  Number falls in past yr: - 1 1  Injury with Fall? - No No  Risk for fall due to : - Impaired balance/gait -    Functional Status Survey: Is the patient deaf or have difficulty hearing?: No Does the patient have difficulty seeing, even when wearing glasses/contacts?: No Does the patient have difficulty concentrating, remembering, or making decisions?: No Does the patient have difficulty walking or climbing stairs?: No Does the patient have difficulty dressing or bathing?: No Does the patient have difficulty doing errands alone such as visiting a doctor's office or shopping?: No    Assessment & Plan  1. Type 2 diabetes mellitus with stage 3 chronic kidney disease, without long-term current use of insulin (HCC)  - POCT glycosylated hemoglobin (Hb A1C) - POCT UA - Microalbumin  2. Hypertension, benign  - valsartan  (DIOVAN) 80 MG tablet; Take 1 tablet (80 mg total) by mouth daily.  Dispense: 90 tablet; Refill: 0 - Comprehensive metabolic panel  3. Dyslipidemia  - Lipid panel  4. Chronic kidney disease (CKD), stage III (moderate)  - Comprehensive metabolic panel - CBC with Differential/Platelet - VITAMIN D 25 Hydroxy (Vit-D Deficiency, Fractures); Future  5. Weight loss  - TSH

## 2015-11-27 LAB — COMPREHENSIVE METABOLIC PANEL
A/G RATIO: 1.7 (ref 1.2–2.2)
ALBUMIN: 4.7 g/dL (ref 3.5–4.8)
ALK PHOS: 77 IU/L (ref 39–117)
ALT: 17 IU/L (ref 0–32)
AST: 25 IU/L (ref 0–40)
BILIRUBIN TOTAL: 0.4 mg/dL (ref 0.0–1.2)
BUN / CREAT RATIO: 28 (ref 12–28)
BUN: 36 mg/dL — AB (ref 8–27)
CHLORIDE: 102 mmol/L (ref 96–106)
CO2: 23 mmol/L (ref 18–29)
Calcium: 10 mg/dL (ref 8.7–10.3)
Creatinine, Ser: 1.3 mg/dL — ABNORMAL HIGH (ref 0.57–1.00)
GFR calc non Af Amer: 40 mL/min/{1.73_m2} — ABNORMAL LOW (ref >59)
GFR, EST AFRICAN AMERICAN: 46 mL/min/{1.73_m2} — AB (ref >59)
GLOBULIN, TOTAL: 2.7 g/dL (ref 1.5–4.5)
Glucose: 107 mg/dL — ABNORMAL HIGH (ref 65–99)
POTASSIUM: 4.5 mmol/L (ref 3.5–5.2)
SODIUM: 143 mmol/L (ref 134–144)
TOTAL PROTEIN: 7.4 g/dL (ref 6.0–8.5)

## 2015-11-27 LAB — LIPID PANEL
Chol/HDL Ratio: 1.7 ratio units (ref 0.0–4.4)
Cholesterol, Total: 213 mg/dL — ABNORMAL HIGH (ref 100–199)
HDL: 126 mg/dL (ref >39)
LDL Calculated: 78 mg/dL (ref 0–99)
TRIGLYCERIDES: 43 mg/dL (ref 0–149)
VLDL CHOLESTEROL CAL: 9 mg/dL (ref 5–40)

## 2015-11-27 LAB — CBC WITH DIFFERENTIAL/PLATELET
BASOS: 0 %
Basophils Absolute: 0 10*3/uL (ref 0.0–0.2)
EOS (ABSOLUTE): 0 10*3/uL (ref 0.0–0.4)
EOS: 0 %
HEMATOCRIT: 35 % (ref 34.0–46.6)
Hemoglobin: 11.3 g/dL (ref 11.1–15.9)
IMMATURE GRANS (ABS): 0 10*3/uL (ref 0.0–0.1)
IMMATURE GRANULOCYTES: 0 %
LYMPHS: 31 %
Lymphocytes Absolute: 1.4 10*3/uL (ref 0.7–3.1)
MCH: 27.9 pg (ref 26.6–33.0)
MCHC: 32.3 g/dL (ref 31.5–35.7)
MCV: 86 fL (ref 79–97)
Monocytes Absolute: 0.5 10*3/uL (ref 0.1–0.9)
Monocytes: 11 %
NEUTROS PCT: 58 %
Neutrophils Absolute: 2.6 10*3/uL (ref 1.4–7.0)
PLATELETS: 217 10*3/uL (ref 150–379)
RBC: 4.05 x10E6/uL (ref 3.77–5.28)
RDW: 16.2 % — ABNORMAL HIGH (ref 12.3–15.4)
WBC: 4.6 10*3/uL (ref 3.4–10.8)

## 2015-11-27 LAB — VITAMIN D 25 HYDROXY (VIT D DEFICIENCY, FRACTURES): VIT D 25 HYDROXY: 46.2 ng/mL (ref 30.0–100.0)

## 2015-11-27 LAB — TSH: TSH: 1.4 u[IU]/mL (ref 0.450–4.500)

## 2016-03-02 ENCOUNTER — Emergency Department
Admission: EM | Admit: 2016-03-02 | Discharge: 2016-03-02 | Disposition: A | Discharge status: Home / Self Care | Payer: Medicare Other | Attending: Emergency Medicine | Admitting: Emergency Medicine

## 2016-03-02 ENCOUNTER — Encounter: Payer: Self-pay | Admitting: Emergency Medicine

## 2016-03-02 DIAGNOSIS — R1030 Lower abdominal pain, unspecified: Secondary | ICD-10-CM | POA: Diagnosis present

## 2016-03-02 DIAGNOSIS — Z7982 Long term (current) use of aspirin: Secondary | ICD-10-CM | POA: Diagnosis not present

## 2016-03-02 DIAGNOSIS — I10 Essential (primary) hypertension: Secondary | ICD-10-CM | POA: Diagnosis not present

## 2016-03-02 DIAGNOSIS — Z79899 Other long term (current) drug therapy: Secondary | ICD-10-CM | POA: Diagnosis not present

## 2016-03-02 DIAGNOSIS — I129 Hypertensive chronic kidney disease with stage 1 through stage 4 chronic kidney disease, or unspecified chronic kidney disease: Secondary | ICD-10-CM | POA: Insufficient documentation

## 2016-03-02 DIAGNOSIS — E1122 Type 2 diabetes mellitus with diabetic chronic kidney disease: Secondary | ICD-10-CM | POA: Insufficient documentation

## 2016-03-02 DIAGNOSIS — N183 Chronic kidney disease, stage 3 (moderate): Secondary | ICD-10-CM | POA: Insufficient documentation

## 2016-03-02 LAB — COMPREHENSIVE METABOLIC PANEL
ALBUMIN: 4.5 g/dL (ref 3.5–5.0)
ALK PHOS: 74 U/L (ref 38–126)
ALT: 17 U/L (ref 14–54)
ANION GAP: 10 (ref 5–15)
AST: 30 U/L (ref 15–41)
BILIRUBIN TOTAL: 0.6 mg/dL (ref 0.3–1.2)
BUN: 31 mg/dL — AB (ref 6–20)
CALCIUM: 9.8 mg/dL (ref 8.9–10.3)
CO2: 24 mmol/L (ref 22–32)
Chloride: 105 mmol/L (ref 101–111)
Creatinine, Ser: 1.31 mg/dL — ABNORMAL HIGH (ref 0.44–1.00)
GFR calc Af Amer: 45 mL/min — ABNORMAL LOW (ref >60)
GFR, EST NON AFRICAN AMERICAN: 38 mL/min — AB (ref >60)
Glucose, Bld: 143 mg/dL — ABNORMAL HIGH (ref 65–99)
Potassium: 3.8 mmol/L (ref 3.5–5.1)
Sodium: 139 mmol/L (ref 135–145)
TOTAL PROTEIN: 7.9 g/dL (ref 6.5–8.1)

## 2016-03-02 LAB — CBC
HCT: 38.3 % (ref 35.0–47.0)
Hemoglobin: 12.7 g/dL (ref 12.0–16.0)
MCH: 28.1 pg (ref 26.0–34.0)
MCHC: 33 g/dL (ref 32.0–36.0)
MCV: 85 fL (ref 80.0–100.0)
PLATELETS: 216 10*3/uL (ref 150–440)
RBC: 4.51 MIL/uL (ref 3.80–5.20)
RDW: 14.6 % — AB (ref 11.5–14.5)
WBC: 5.6 10*3/uL (ref 3.6–11.0)

## 2016-03-02 LAB — URINALYSIS COMPLETE WITH MICROSCOPIC (ARMC ONLY)
BILIRUBIN URINE: NEGATIVE
Bacteria, UA: NONE SEEN
GLUCOSE, UA: NEGATIVE mg/dL
KETONES UR: NEGATIVE mg/dL
Nitrite: NEGATIVE
PROTEIN: NEGATIVE mg/dL
SPECIFIC GRAVITY, URINE: 1.006 (ref 1.005–1.030)
pH: 5 (ref 5.0–8.0)

## 2016-03-02 LAB — LIPASE, BLOOD: Lipase: 23 U/L (ref 11–51)

## 2016-03-02 MED ORDER — HYDROCHLOROTHIAZIDE 12.5 MG PO CAPS: 12.5000 mg | ORAL_CAPSULE | Freq: Every day | ORAL | 0 refills | Status: DC

## 2016-03-02 MED ORDER — HYDROCHLOROTHIAZIDE 12.5 MG PO CAPS
12.5000 mg | ORAL_CAPSULE | Freq: Every day | ORAL | Status: DC
  Administered 2016-03-02: 12.5 mg via ORAL
  Filled 2016-03-02: qty 1

## 2016-03-02 NOTE — ED Provider Notes (Signed)
[facility] Emergency Department Provider Note   ____________________________________________    I have reviewed the triage vital signs and the nursing notes.   HISTORY  Chief Complaint Abdominal Pain and Hypertension     HPI Melanie CourserKay Gray is a 75 y.o. female who presents with complaints of elevated blood pressure. Patient reports she checks her blood pressure regularly and several months ago her blood pressure had been too low so her PCP changed her medications. She checked her blood pressure yesterday it was in the 150s systolic and again today after she checked it multiple times. She reports she feels well and has no complaints. No chest pain fevers chills dizziness headache. After checking her blood pressure she did feel like she might have some discomfort in her lower abdomen without resolved prior to arrival. She reports compliance with her medications   Past Medical History:  Diagnosis Date  . Diabetes mellitus without complication (HCC)   . Hyperlipidemia   . Hypertension     Patient Active Problem List   Diagnosis Date Noted  . Benign hypertension 11/06/2014  . Chronic anemia 11/06/2014  . Seborrheic dermatitis 11/06/2014  . Diabetes mellitus with renal manifestation (HCC) 11/06/2014  . Dyslipidemia 11/06/2014  . Cephalalgia 11/06/2014  . Deafness, sensorineural 11/06/2014  . Liver regeneration 11/06/2014  . Arthritis of knee, degenerative 11/06/2014  . Panic attack 11/06/2014  . Restless legs syndrome 11/06/2014  . Allergic rhinitis 11/06/2014  . Spinal stenosis 11/06/2014  . Osteopenia 05/09/2009  . Chronic kidney disease (CKD), stage III (moderate) 04/20/2008    Past Surgical History:  Procedure Laterality Date  . ABDOMINAL HYSTERECTOMY    . CHOLECYSTECTOMY    . HERNIA REPAIR      Prior to Admission medications   Medication Sig Start Date End Date Taking? Authorizing Provider  aspirin EC 81 MG tablet Take 81 mg by mouth  daily. 01/06/11  Yes Historical Provider, MD  Cholecalciferol (VITAMIN D) 2000 UNITS tablet Take 1 tablet by mouth daily.   Yes Historical Provider, MD  Glucosamine-Chondroitin 250-200 MG TABS Take 1 tablet by mouth daily.   Yes Historical Provider, MD  Multiple Vitamin (MULTI-VITAMINS) TABS Take 1 tablet by mouth daily.   Yes Historical Provider, MD  pravastatin (PRAVACHOL) 40 MG tablet TAKE 1 TABLET (40 MG TOTAL) BY MOUTH DAILY. 10/12/15  Yes Alba CoryKrichna Sowles, MD  valsartan (DIOVAN) 80 MG tablet Take 1 tablet (80 mg total) by mouth daily. 11/26/15  Yes Alba CoryKrichna Sowles, MD  hydrochlorothiazide (MICROZIDE) 12.5 MG capsule Take 1 capsule (12.5 mg total) by mouth daily. 03/02/16   Jene Everyobert Ellaina Schuler, MD     Allergies Sulfa antibiotics  Family History  Problem Relation Age of Onset  . Cancer Mother   . Cancer Sister   . Breast cancer Neg Hx     Social History Social History  Substance Use Topics  . Smoking status: Never Smoker  . Smokeless tobacco: Never Used  . Alcohol use No    Review of Systems  Constitutional: No Dizziness Eyes: No visual changes.  ENT: No neck pain Cardiovascular: Denies chest pain. Respiratory: Denies shortness of breath. Gastrointestinal: No abdominal pain.  No nausea, no vomiting.   Genitourinary: Negative for dysuria. Musculoskeletal: Negative for back pain. Skin: Negative for rash. Neurological: Negative for headaches or weakness  10-point ROS otherwise negative.  ____________________________________________   PHYSICAL EXAM:  VITAL SIGNS: ED Triage Vitals  Enc Vitals Group     BP 03/02/16 2011 (!) 159/96  Pulse Rate 03/02/16 2011 (!) 103     Resp 03/02/16 2011 18     Temp 03/02/16 2011 98.3 F (36.8 C)     Temp Source 03/02/16 2011 Oral     SpO2 03/02/16 2011 100 %     Weight 03/02/16 2015 150 lb (68 kg)     Height 03/02/16 2015 5\' 6"  (1.676 m)     Head Circumference --      Peak Flow --      Pain Score 03/02/16 2015 3     Pain Loc --       Pain Edu? --      Excl. in GC? --     Constitutional: Alert and oriented. No acute distress. Pleasant and interactive Eyes: Conjunctivae are normal.  Head: Atraumatic. Nose: No congestion/rhinnorhea. Mouth/Throat: Mucous membranes are moist.   Neck:  Painless ROM Cardiovascular: Normal rate, regular rhythm. Grossly normal heart sounds.  Good peripheral circulation. Respiratory: Normal respiratory effort.  No retractions. Lungs CTAB. Gastrointestinal: Soft and nontender. No distention.  No CVA tenderness. Genitourinary: deferred Musculoskeletal: No lower extremity tenderness nor edema.  Warm and well perfused Neurologic:  Normal speech and language. No gross focal neurologic deficits are appreciated.  Skin:  Skin is warm, dry and intact. No rash noted. Psychiatric: Mood and affect are normal. Speech and behavior are normal.  ____________________________________________   LABS (all labs ordered are listed, but only abnormal results are displayed)  Labs Reviewed  COMPREHENSIVE METABOLIC PANEL - Abnormal; Notable for the following:       Result Value   Glucose, Bld 143 (*)    BUN 31 (*)    Creatinine, Ser 1.31 (*)    GFR calc non Af Amer 38 (*)    GFR calc Af Amer 45 (*)    All other components within normal limits  CBC - Abnormal; Notable for the following:    RDW 14.6 (*)    All other components within normal limits  URINALYSIS COMPLETEWITH MICROSCOPIC ([facility] ONLY) - Abnormal; Notable for the following:    Color, Urine STRAW (*)    APPearance CLEAR (*)    Hgb urine dipstick 1+ (*)    Leukocytes, UA TRACE (*)    Squamous Epithelial / LPF 0-5 (*)    All other components within normal limits  LIPASE, BLOOD   ____________________________________________  EKG  None ____________________________________________  RADIOLOGY  None ____________________________________________   PROCEDURES  Procedure(s) performed: No    Critical Care performed:  No ____________________________________________   INITIAL IMPRESSION / ASSESSMENT AND PLAN / ED COURSE  Pertinent labs & imaging results that were available during my care of the patient were reviewed by me and considered in my medical decision making (see chart for details).  Reassurance provided to the patient. Lab work is unremarkable. Exam is benign. Patient is well-appearing and in no distress. We will add 12.5 of HCTZ and have the patient follow closely with PCP.  Clinical Course   ____________________________________________   FINAL CLINICAL IMPRESSION(S) / ED DIAGNOSES  Final diagnoses:  Essential hypertension      NEW MEDICATIONS STARTED DURING THIS VISIT:  Discharge Medication List as of 03/02/2016  9:12 PM    START taking these medications   Details  hydrochlorothiazide (MICROZIDE) 12.5 MG capsule Take 1 capsule (12.5 mg total) by mouth daily., Starting Sun 03/02/2016, Print         Note:  This document was prepared using Dragon voice recognition software and may include unintentional dictation errors.  Jene Everyobert Maanav Kassabian, MD 03/02/16 2250

## 2016-03-02 NOTE — ED Triage Notes (Signed)
Pt ambulatory to triage with slow steady gait using a cane. Pt reports she came to the ED tonight because yesterday and today when she checked her blood pressure it was elevated at 156/104 and 157/98 and 149/81 and 171/99. Pt reports she is normally less than 120/80. Pt also reports having low mid line abd pain. Pt denies n/v/d and also changes in her urinary habits.

## 2016-03-06 ENCOUNTER — Telehealth: Payer: Self-pay

## 2016-03-06 NOTE — Telephone Encounter (Signed)
Not secondary to HCTZ, she can try otc miralax but if not better needs follow up. Make sure she is passing gas

## 2016-03-06 NOTE — Telephone Encounter (Signed)
Patient went to the ER due to having elevated BP and they started her on HCTZ. Since then patient has not used the bathroom and wanted to know could you call something in or what to do take. Do you think the HCTZ causing her constipation? Patient checked her BP this morning and it was 128/83.

## 2016-03-25 ENCOUNTER — Telehealth: Payer: Self-pay | Admitting: Family Medicine

## 2016-03-25 NOTE — Telephone Encounter (Signed)
She needs to have hemoccult cards done asap. [facility]

## 2016-03-25 NOTE — Telephone Encounter (Signed)
Patient states today she had a very dark stool and very concerned about it. Please advise. Patient denies any change in diet.

## 2016-03-25 NOTE — Telephone Encounter (Signed)
Pt notified will pick up occult card tomorrow.

## 2016-03-28 ENCOUNTER — Ambulatory Visit (INDEPENDENT_AMBULATORY_CARE_PROVIDER_SITE_OTHER): Payer: Medicare Other | Admitting: Family Medicine

## 2016-03-28 ENCOUNTER — Encounter: Payer: Self-pay | Admitting: Family Medicine

## 2016-03-28 VITALS — BP 118/80 | HR 110 | Temp 97.6°F | Resp 16 | Ht 66.0 in | Wt 142.7 lb

## 2016-03-28 DIAGNOSIS — Z23 Encounter for immunization: Secondary | ICD-10-CM

## 2016-03-28 DIAGNOSIS — E1122 Type 2 diabetes mellitus with diabetic chronic kidney disease: Secondary | ICD-10-CM | POA: Diagnosis not present

## 2016-03-28 DIAGNOSIS — R195 Other fecal abnormalities: Secondary | ICD-10-CM

## 2016-03-28 DIAGNOSIS — N183 Chronic kidney disease, stage 3 unspecified: Secondary | ICD-10-CM

## 2016-03-28 DIAGNOSIS — F329 Major depressive disorder, single episode, unspecified: Secondary | ICD-10-CM

## 2016-03-28 DIAGNOSIS — R109 Unspecified abdominal pain: Secondary | ICD-10-CM

## 2016-03-28 DIAGNOSIS — I10 Essential (primary) hypertension: Secondary | ICD-10-CM

## 2016-03-28 DIAGNOSIS — E785 Hyperlipidemia, unspecified: Secondary | ICD-10-CM

## 2016-03-28 DIAGNOSIS — M170 Bilateral primary osteoarthritis of knee: Secondary | ICD-10-CM | POA: Diagnosis not present

## 2016-03-28 LAB — POC HEMOCCULT BLD/STL (HOME/3-CARD/SCREEN)
Card #2 Fecal Occult Blod, POC: NEGATIVE
FECAL OCCULT BLD: NEGATIVE
Fecal Occult Blood, POC: NEGATIVE

## 2016-03-28 LAB — POCT GLYCOSYLATED HEMOGLOBIN (HGB A1C): Hemoglobin A1C: 6.1

## 2016-03-28 LAB — POC HEMOCCULT BLD/STL (OFFICE/1-CARD/DIAGNOSTIC): FECAL OCCULT BLD: NEGATIVE

## 2016-03-28 NOTE — Progress Notes (Signed)
Name: Melanie Gray   MRN: [id number]    DOB: 1940/08/19   Date:03/28/2016       Progress Note  Subjective  Chief Complaint  Chief Complaint  Patient presents with  . Medication Refill    4 month F/U  . Diabetes    Patient checks blood sugar once a week on Saturday pre-prandially, Average in the 100's  . Hypertension    Patient states she check her BP at home and it is less 120/72  . Hyperlipidemia  . Stool Color Change    Onset-2 week, Patient states she has been having dark stools and seeing red in the toilet and having cramping in her abdominal.     HPI  HTN: she is taking Diovan/HCTZ as prescribed, but bp has been towards low end of normal. Discussed that because of her age I prefer her BP to be in the 130's.  She was taking Diovan 80 mg, she checked her bp at home and bp was up to 140's , felt dizzy and went to [facility] ([facility] they resumed HCTZ but now bp is low again and she has tachycardia. Advised to stop HCTZ again  Diabetes with renal manifestation: GFR stable, on ARB, she denies polyphagia, polydipsia or polyuria. She is on diet only. She has been very concerned about her DM, avoiding sweets, carbohydrates and reads labels on everything. She has lost weight. She states not for lack of appetite, just worried about DM getting out of control, like her husband's She states she was doing well, stopped reading all the labels and weight had gone up to 150 lbs, however she has been sick for the past couple of weeks and weight is down again.   Hyperlipidemia: taking Pravastatin, denies myalgia , compliant with medication.   Abdominal cramp/dark stools: she states that for the past 2 weeks her stomach has been off, she sates that she took a laxative 3 weeks ago and had good bowel movements, and she states about 2 weeks ago her stools started to be dark in color, over the past few days she states stools were even darker, maybe even black, no bright blood, denies abdominal pain, but she noticed some  cramping since yesterday intermittently, no nausea or vomiting, no fever, no change in appetite  Major Depression: She denies being depressed, she states she is an introvert, she isolates herself on purpose. She seems to be always concerned about her health lately, advised to volunteer and engage with other people. She is not socializing with her family or friends. No longer going to church. Explained importance of having regular visits with loved ones. She has been worried about her kids, she feels like she is a burden, sister diagnosed with cancer and is in [place]  Patient Active Problem List   Diagnosis Date Noted  . Benign hypertension 11/06/2014  . Chronic anemia 11/06/2014  . Seborrheic dermatitis 11/06/2014  . Diabetes mellitus with renal manifestation ([place]) 11/06/2014  . Dyslipidemia 11/06/2014  . Cephalalgia 11/06/2014  . Deafness, sensorineural 11/06/2014  . Liver regeneration 11/06/2014  . Arthritis of knee, degenerative 11/06/2014  . Panic attack 11/06/2014  . Restless legs syndrome 11/06/2014  . Allergic rhinitis 11/06/2014  . Spinal stenosis 11/06/2014  . Osteopenia 05/09/2009  . Chronic kidney disease (CKD), stage III (moderate) 04/20/2008    Past Surgical History:  Procedure Laterality Date  . ABDOMINAL HYSTERECTOMY    . CHOLECYSTECTOMY    . HERNIA REPAIR      Family History  Problem Relation Age  of Onset  . Cancer Mother   . Cancer Sister   . Breast cancer Neg Hx     Social History   Social History  . Marital status: Widowed    Spouse name: N/A  . Number of children: N/A  . Years of education: N/A   Occupational History  . unemployed    Social History Main Topics  . Smoking status: Never Smoker  . Smokeless tobacco: Never Used  . Alcohol use No  . Drug use: No  . Sexual activity: No   Other Topics Concern  . Not on file   Social History Narrative  . No narrative on file     Current Outpatient Prescriptions:  .  aspirin EC 81 MG tablet, Take  81 mg by mouth daily., Disp: , Rfl:  .  Cholecalciferol (VITAMIN D) 2000 UNITS tablet, Take 1 tablet by mouth daily., Disp: , Rfl:  .  Glucosamine-Chondroitin 250-200 MG TABS, Take 1 tablet by mouth daily., Disp: , Rfl:  .  GLUCOSE BLOOD VI, ONETOUCH ULTRA BLUE (In Vitro Strip)  use as directed for 0 days  Quantity: 100.00;  Refills: 0   Ordered :25-Jul-2010  Steele Sizer MD;  Buddy Duty 21-December-2010 Active Comments: DX: 790.29, Disp: , Rfl:  .  Multiple Vitamin (MULTI-VITAMINS) TABS, Take 1 tablet by mouth daily., Disp: , Rfl:  .  pravastatin (PRAVACHOL) 40 MG tablet, TAKE 1 TABLET (40 MG TOTAL) BY MOUTH DAILY., Disp: 90 tablet, Rfl: 1 .  valsartan (DIOVAN) 80 MG tablet, Take 1 tablet (80 mg total) by mouth daily., Disp: 90 tablet, Rfl: 0  Allergies  Allergen Reactions  . Sulfa Antibiotics Rash     ROS  Constitutional: Negative for fever or significant weight change.  Respiratory: Negative for cough and shortness of breath.   Cardiovascular: Negative for chest pain or palpitations.  Gastrointestinal: Negative for abdominal pain, no bowel changes.  Musculoskeletal: Positive  for gait problem and  joint swelling.  Skin: Negative for rash.  Neurological: Negative for dizziness or headache.  No other specific complaints in a complete review of systems (except as listed in HPI above).  Objective  Vitals:   03/28/16 1006  BP: 118/80  Pulse: (!) 110  Resp: 16  Temp: 97.6 F (36.4 C)  TempSrc: Oral  SpO2: 94%  Weight: 142 lb 11.2 oz (64.7 kg)  Height: 5' 6"  (1.676 m)    Body mass index is 23.03 kg/m.  Physical Exam  Constitutional: Patient appears well-developed and well-nourished.  No distress.  HEENT: head atraumatic, normocephalic, pupils equal and reactive to light,neck supple, throat within normal limits Cardiovascular: Normal rate, regular rhythm and normal heart sounds.  No murmur heard. No BLE edema. Pulmonary/Chest: Effort normal and breath sounds normal. No  respiratory distress. Abdominal: Soft.  There is no tenderness, increase in bowel sounds, solid stools in vault, brown in color, negative hemoccult Psychiatric: Patient has a normal mood and affect. behavior is normal. Judgment and thought content normal.  Recent Results (from the past 2160 hour(s))  Lipase, blood     Status: None   Collection Time: 03/02/16  8:15 PM  Result Value Ref Range   Lipase 23 11 - 51 U/L  Comprehensive metabolic panel     Status: Abnormal   Collection Time: 03/02/16  8:15 PM  Result Value Ref Range   Sodium 139 135 - 145 mmol/L   Potassium 3.8 3.5 - 5.1 mmol/L   Chloride 105 101 - 111 mmol/L   CO2 24 22 -  32 mmol/L   Glucose, Bld 143 (H) 65 - 99 mg/dL   BUN 31 (H) 6 - 20 mg/dL   Creatinine, Ser 1.31 (H) 0.44 - 1.00 mg/dL   Calcium 9.8 8.9 - 10.3 mg/dL   Total Protein 7.9 6.5 - 8.1 g/dL   Albumin 4.5 3.5 - 5.0 g/dL   AST 30 15 - 41 U/L   ALT 17 14 - 54 U/L   Alkaline Phosphatase 74 38 - 126 U/L   Total Bilirubin 0.6 0.3 - 1.2 mg/dL   GFR calc non Af Amer 38 (L) >60 mL/min   GFR calc Af Amer 45 (L) >60 mL/min    Comment: (NOTE) The eGFR has been calculated using the CKD EPI equation. This calculation has not been validated in all clinical situations. eGFR's persistently <60 mL/min signify possible Chronic Kidney Disease.    Anion gap 10 5 - 15  CBC     Status: Abnormal   Collection Time: 03/02/16  8:15 PM  Result Value Ref Range   WBC 5.6 3.6 - 11.0 K/uL   RBC 4.51 3.80 - 5.20 MIL/uL   Hemoglobin 12.7 12.0 - 16.0 g/dL   HCT 38.3 35.0 - 47.0 %   MCV 85.0 80.0 - 100.0 fL   MCH 28.1 26.0 - 34.0 pg   MCHC 33.0 32.0 - 36.0 g/dL   RDW 14.6 (H) 11.5 - 14.5 %   Platelets 216 150 - 440 K/uL  Urinalysis complete, with microscopic     Status: Abnormal   Collection Time: 03/02/16  8:15 PM  Result Value Ref Range   Color, Urine STRAW (A) YELLOW   APPearance CLEAR (A) CLEAR   Glucose, UA NEGATIVE NEGATIVE mg/dL   Bilirubin Urine NEGATIVE NEGATIVE    Ketones, ur NEGATIVE NEGATIVE mg/dL   Specific Gravity, Urine 1.006 1.005 - 1.030   Hgb urine dipstick 1+ (A) NEGATIVE   pH 5.0 5.0 - 8.0   Protein, ur NEGATIVE NEGATIVE mg/dL   Nitrite NEGATIVE NEGATIVE   Leukocytes, UA TRACE (A) NEGATIVE   RBC / HPF 0-5 0 - 5 RBC/hpf   WBC, UA 0-5 0 - 5 WBC/hpf   Bacteria, UA NONE SEEN NONE SEEN   Squamous Epithelial / LPF 0-5 (A) NONE SEEN  POCT HgB A1C     Status: None   Collection Time: 03/28/16 10:15 AM  Result Value Ref Range   Hemoglobin A1C 6.1   POC Hemoccult Bld/Stl (3-Cd Home Screen)     Status: Normal   Collection Time: 03/28/16 10:19 AM  Result Value Ref Range   Card #1 Date 03/27/2016    Fecal Occult Blood, POC Negative Negative   Card #2 Date 03/27/2016    Card #2 Fecal Occult Blod, POC Negative    Card #3 Date 03/28/2016    Card #3 Fecal Occult Blood, POC Negative     Diabetic Foot Exam: Diabetic Foot Exam - Simple   Simple Foot Form Diabetic Foot exam was performed with the following findings:  Yes 03/28/2016 10:47 AM  Visual Inspection See comments:  Yes Sensation Testing Intact to touch and monofilament testing bilaterally:  Yes Pulse Check Posterior Tibialis and Dorsalis pulse intact bilaterally:  Yes Comments She has hammer toes, thick toenails      PHQ2/9: Depression screen [facility] 2/9 03/28/2016 11/26/2015 07/27/2015 03/26/2015  Decreased Interest 0 0 0 0  Down, Depressed, Hopeless 0 0 0 0  PHQ - 2 Score 0 0 0 0     Fall Risk: Fall Risk  03/28/2016 11/26/2015 07/27/2015 03/26/2015  Falls in the past year? No No Yes Yes  Number falls in past yr: - - 1 1  Injury with Fall? - - No No  Risk for fall due to : - - Impaired balance/gait -     Functional Status Survey: Is the patient deaf or have difficulty hearing?: No Does the patient have difficulty seeing, even when wearing glasses/contacts?: No Does the patient have difficulty concentrating, remembering, or making decisions?: No Does the patient have difficulty  walking or climbing stairs?: No Does the patient have difficulty dressing or bathing?: No Does the patient have difficulty doing errands alone such as visiting a doctor's office or shopping?: No    Assessment & Plan  1. Type 2 diabetes mellitus with stage 3 chronic kidney disease, without long-term current use of insulin (HCC)  - POCT HgB A1C  2. Dark stools  Negative hemoccult, explained that may have been something she ate, reassurance for now - POC Hemoccult Bld/Stl (3-Cd Home Screen); Future - POC Hemoccult Bld/Stl (3-Cd Home Screen) - POC Hemoccult Bld/Stl (1-Cd Office Dx)  3. Hypertension, benign  Stop HCTZ, continue Diovan  4. Abdominal cramping  - POC Hemoccult Bld/Stl (1-Cd Office Dx)  5. Dyslipidemia  Continue medication   6. Chronic kidney disease (CKD), stage III (moderate)  Recheck next visit   7. Needs flu shot  - Flu vaccine HIGH DOSE PF  8. Primary osteoarthritis of both knees  Stable, using a cane  9. Major depression, chronic ([place])  She refuses taking medication for depression

## 2016-04-11 ENCOUNTER — Other Ambulatory Visit: Payer: Self-pay | Admitting: Family Medicine

## 2016-04-11 DIAGNOSIS — I10 Essential (primary) hypertension: Secondary | ICD-10-CM

## 2016-04-11 NOTE — Telephone Encounter (Signed)
Patient requesting refill of Diovan to [id number].

## 2016-07-06 ENCOUNTER — Emergency Department: Payer: Medicare Other

## 2016-07-06 ENCOUNTER — Emergency Department
Admission: EM | Admit: 2016-07-06 | Discharge: 2016-07-06 | Disposition: A | Discharge status: Home / Self Care | Payer: Medicare Other | Attending: Emergency Medicine | Admitting: Emergency Medicine

## 2016-07-06 ENCOUNTER — Other Ambulatory Visit: Payer: Self-pay | Admitting: Family Medicine

## 2016-07-06 DIAGNOSIS — S2242XA Multiple fractures of ribs, left side, initial encounter for closed fracture: Secondary | ICD-10-CM | POA: Diagnosis not present

## 2016-07-06 DIAGNOSIS — W228XXA Striking against or struck by other objects, initial encounter: Secondary | ICD-10-CM | POA: Insufficient documentation

## 2016-07-06 DIAGNOSIS — Z79899 Other long term (current) drug therapy: Secondary | ICD-10-CM | POA: Insufficient documentation

## 2016-07-06 DIAGNOSIS — I129 Hypertensive chronic kidney disease with stage 1 through stage 4 chronic kidney disease, or unspecified chronic kidney disease: Secondary | ICD-10-CM | POA: Insufficient documentation

## 2016-07-06 DIAGNOSIS — E1122 Type 2 diabetes mellitus with diabetic chronic kidney disease: Secondary | ICD-10-CM | POA: Insufficient documentation

## 2016-07-06 DIAGNOSIS — Y9389 Activity, other specified: Secondary | ICD-10-CM | POA: Diagnosis not present

## 2016-07-06 DIAGNOSIS — Z7982 Long term (current) use of aspirin: Secondary | ICD-10-CM | POA: Diagnosis not present

## 2016-07-06 DIAGNOSIS — Y92000 Kitchen of unspecified non-institutional (private) residence as  the place of occurrence of the external cause: Secondary | ICD-10-CM | POA: Diagnosis not present

## 2016-07-06 DIAGNOSIS — N183 Chronic kidney disease, stage 3 (moderate): Secondary | ICD-10-CM | POA: Insufficient documentation

## 2016-07-06 DIAGNOSIS — Y999 Unspecified external cause status: Secondary | ICD-10-CM | POA: Diagnosis not present

## 2016-07-06 DIAGNOSIS — S299XXA Unspecified injury of thorax, initial encounter: Secondary | ICD-10-CM | POA: Diagnosis present

## 2016-07-06 DIAGNOSIS — I10 Essential (primary) hypertension: Secondary | ICD-10-CM

## 2016-07-06 MED ORDER — ACETAMINOPHEN 325 MG PO TABS
650.0000 mg | ORAL_TABLET | Freq: Once | ORAL | Status: AC
  Administered 2016-07-06: 650 mg via ORAL
  Filled 2016-07-06: qty 2

## 2016-07-06 NOTE — ED Triage Notes (Deleted)
Pt reports she was baking some pies and smelled something burning when she reached back out of her oven she turned too quickly and hit her left side, upper back on the kitchen counter and knocked her down to her knees. Pt has unsteady gait normally as she has right knee problems and uses a cane to ambulate. Denies any dizziness or loss of consciousness. Pt c/o soreness to the left side.

## 2016-07-06 NOTE — ED Triage Notes (Signed)
Pt reports she was baking some pies and smelled something burning when she reached back out of her oven she turned too quickly and hit her left side, upper back on the kitchen counter and knocked her down to her knees. Pt has unsteady gait normally as she has right knee problems and uses a cane to ambulate. Denies any dizziness or loss of consciousness. Pt c/o soreness to left side.

## 2016-07-06 NOTE — ED Provider Notes (Signed)
[facility] Emergency Department Provider Note  ____________________________________________  Time seen: Approximately 3:11 PM  I have reviewed the triage vital signs and the nursing notes.   HISTORY  Chief Complaint Fall    HPI Melanie Gray is a 75 y.o. female presents to the emergency department after hitting her left side on the kitchen counter while turning quickly to check on pies in the oven. Patient has pain over left rib cage with movement and pain with deep breath breathing. Patient states that the impact made her go to her knees. Patient denies hitting head or losing consciousness. No confusion. Family was in the next room during incident. Patient recently had a bone density scan and was told that she has osteopenia.   Past Medical History:  Diagnosis Date  . Diabetes mellitus without complication (HCC)   . Hyperlipidemia   . Hypertension     Patient Active Problem List   Diagnosis Date Noted  . Benign hypertension 11/06/2014  . Chronic anemia 11/06/2014  . Seborrheic dermatitis 11/06/2014  . Diabetes mellitus with renal manifestation (HCC) 11/06/2014  . Dyslipidemia 11/06/2014  . Cephalalgia 11/06/2014  . Deafness, sensorineural 11/06/2014  . Liver regeneration 11/06/2014  . Arthritis of knee, degenerative 11/06/2014  . Panic attack 11/06/2014  . Restless legs syndrome 11/06/2014  . Allergic rhinitis 11/06/2014  . Spinal stenosis 11/06/2014  . Osteopenia 05/09/2009  . Chronic kidney disease (CKD), stage III (moderate) 04/20/2008    Past Surgical History:  Procedure Laterality Date  . ABDOMINAL HYSTERECTOMY    . CHOLECYSTECTOMY    . HERNIA REPAIR      Prior to Admission medications   Medication Sig Start Date End Date Taking? Authorizing Provider  aspirin EC 81 MG tablet Take 81 mg by mouth daily. 01/06/11   Historical Provider, MD  Cholecalciferol (VITAMIN D) 2000 UNITS tablet Take 1 tablet by mouth daily.    Historical Provider, MD   Glucosamine-Chondroitin 250-200 MG TABS Take 1 tablet by mouth daily.    Historical Provider, MD  GLUCOSE BLOOD VI ONETOUCH ULTRA BLUE (In Vitro Strip)  use as directed for 0 days  Quantity: 100.00;  Refills: 0   Ordered :25-Jul-2010  Alba Cory MD;  Started 21-December-2008 Active Comments: DX: 790.29 12/21/08   Historical Provider, MD  Multiple Vitamin (MULTI-VITAMINS) TABS Take 1 tablet by mouth daily.    Historical Provider, MD  pravastatin (PRAVACHOL) 40 MG tablet TAKE 1 TABLET (40 MG TOTAL) BY MOUTH DAILY. 04/11/16   Alba Cory, MD  valsartan (DIOVAN) 80 MG tablet TAKE 1 TABLET BY MOUTH EVERY DAY 04/11/16   Alba Cory, MD    Allergies Sulfa antibiotics  Family History  Problem Relation Age of Onset  . Cancer Mother   . Cancer Sister   . Breast cancer Neg Hx     Social History Social History  Substance Use Topics  . Smoking status: Never Smoker  . Smokeless tobacco: Never Used  . Alcohol use No     Review of Systems  Cardiovascular: No chest pain. Respiratory: No cough. No SOB. Gastrointestinal: No abdominal pain.  No nausea, no vomiting.  Skin: Negative for rash, abrasions, lacerations, ecchymosis. Neurological: Negative for headaches, numbness or tingling   ____________________________________________   PHYSICAL EXAM:  VITAL SIGNS: ED Triage Vitals [07/06/16 1221]  Enc Vitals Group     BP (!) 162/85     Pulse Rate (!) 54     Resp 18     Temp 97.7 F (36.5 C)  Temp Source Oral     SpO2 100 %     Weight 145 lb (65.8 kg)     Height 5\' 6"  (1.676 m)     Head Circumference      Peak Flow      Pain Score 8     Pain Loc      Pain Edu?      Excl. in GC?      Constitutional: Alert and oriented. Well appearing and in no acute distress. Eyes: Conjunctivae are normal. PERRL. EOMI. Head: Atraumatic. ENT:      Ears:      Nose: No congestion/rhinnorhea.      Mouth/Throat: Mucous membranes are moist.  Neck: No stridor.  Cardiovascular: Normal  rate, regular rhythm. Normal S1 and S2.  Good peripheral circulation. Respiratory: Normal respiratory effort without tachypnea or retractions. Lungs CTAB. Good air entry to the bases with no decreased or absent breath sounds. Gastrointestinal: Bowel sounds 4 quadrants. Soft and nontender to palpation. No guarding or rigidity. No palpable masses. No distention. No CVA tenderness. Musculoskeletal: Full range of motion to all extremities. No gross deformities appreciated. Tenderness to palpation over lower left rib cage. Neurologic:  Normal speech and language. No gross focal neurologic deficits are appreciated.  Skin:  Skin is warm, dry and intact. No rash noted. Psychiatric: Mood and affect are normal. Speech and behavior are normal. Patient exhibits appropriate insight and judgement.   ____________________________________________   LABS (all labs ordered are listed, but only abnormal results are displayed)  Labs Reviewed - No data to display ____________________________________________  EKG   ____________________________________________  RADIOLOGY Lexine BatonI, Cecilia Nishikawa, personally viewed and evaluated these images (plain radiographs) as part of my medical decision making, as well as reviewing the written report by the radiologist.  Dg Ribs Unilateral W/chest Left  Result Date: 07/06/2016 CLINICAL DATA:  Fall. EXAM: LEFT RIBS AND CHEST - 3+ VIEW COMPARISON:  None. FINDINGS: Lungs are clear. Negative for pneumothorax. Heart and mediastinum are within normal limits. Marker was placed in the left lower chest. There is a displaced fracture involving the left ninth rib. There are nondisplaced fractures involving the left seventh and eighth ribs. IMPRESSION: Fractures of the left seventh, eighth and ninth ribs. Negative for pneumothorax. Electronically Signed   By: Richarda OverlieAdam  Henn M.D.   On: 07/06/2016 13:52    ____________________________________________    PROCEDURES  Procedure(s) performed:     Procedures    Medications  acetaminophen (TYLENOL) tablet 650 mg (650 mg Oral Given 07/06/16 1314)     ____________________________________________   INITIAL IMPRESSION / ASSESSMENT AND PLAN / ED COURSE  Pertinent labs & imaging results that were available during my care of the patient were reviewed by me and considered in my medical decision making (see chart for details).  Review of the [place] CSRS was performed in accordance of the [facility] prior to dispensing any controlled drugs.  Clinical Course     Patient's diagnosis is consistent with multiple rib fractures. She did not want anything for pain in the emergency department. Patient will alternate Tylenol and ibuprofen for pain. Patient is to follow up with PCP as directed. Patient is given ED precautions to return to the ED for any worsening or new symptoms.  ____________________________________________  FINAL CLINICAL IMPRESSION(S) / ED DIAGNOSES  Final diagnoses:  Closed fracture of multiple ribs of left side, initial encounter      NEW MEDICATIONS STARTED DURING THIS VISIT:  Discharge Medication List as of 07/06/2016  2:07  PM          This chart was dictated using voice recognition software/Dragon. Despite best efforts to proofread, errors can occur which can change the meaning. Any change was purely unintentional.    Enid Derryshley Kileigh Ortmann, PA-C 07/06/16 1515    Myrna Blazeravid Matthew Schaevitz, MD 07/06/16 1728

## 2016-07-06 NOTE — ED Notes (Signed)
See triage note..the patient c/o L rib cage pain r/t fall.  Pt sts that fall was mechanical in nature, denies head injury or LOC.  Pt able to move all limbs on commend. A/Ox4.  Lung sounds auscultated: clear all fields.

## 2016-07-16 ENCOUNTER — Ambulatory Visit (INDEPENDENT_AMBULATORY_CARE_PROVIDER_SITE_OTHER): Payer: Medicare Other | Admitting: Family Medicine

## 2016-07-16 ENCOUNTER — Encounter: Payer: Self-pay | Admitting: Family Medicine

## 2016-07-16 VITALS — BP 124/68 | HR 96 | Temp 97.9°F | Resp 16 | Ht 66.0 in | Wt 149.2 lb

## 2016-07-16 DIAGNOSIS — S2242XD Multiple fractures of ribs, left side, subsequent encounter for fracture with routine healing: Secondary | ICD-10-CM | POA: Diagnosis not present

## 2016-07-16 DIAGNOSIS — R600 Localized edema: Secondary | ICD-10-CM

## 2016-07-16 DIAGNOSIS — Z9181 History of falling: Secondary | ICD-10-CM | POA: Diagnosis not present

## 2016-07-16 NOTE — Progress Notes (Signed)
Name: Melanie Gray   MRN: [id number]    DOB: [birth year]   Date:07/16/2016       Progress Note  Subjective  Chief Complaint  Chief Complaint  Patient presents with  . Hospitalization Follow-up    due to fall on 07/06/16  injured ribs on left side  . Leg Swelling    in both legs. Pt has been sleeping in a recliner since her fall    HPI  Recent fall: she was baking and fell on her kitchen, hitting left side of her chest on counter top. She denies any loss of consciousness, daughter and son were in the house. She was more concerned with her pies than the fall. [facility], 8th and 9 th left ribs. She states pain is much better control , pain right now is zero, but 5/10 with certain movements, but pain is worse when trying to get in bed, so she has been sleeping on her recliner ( but legs are much lower than her chest), so she has noticed swelling on both feet. No calf pain.   Patient Active Problem List   Diagnosis Date Noted  . Benign hypertension 11/06/2014  . Chronic anemia 11/06/2014  . Seborrheic dermatitis 11/06/2014  . Diabetes mellitus with renal manifestation (HCC) 11/06/2014  . Dyslipidemia 11/06/2014  . Cephalalgia 11/06/2014  . Deafness, sensorineural 11/06/2014  . Liver regeneration 11/06/2014  . Arthritis of knee, degenerative 11/06/2014  . Panic attack 11/06/2014  . Restless legs syndrome 11/06/2014  . Allergic rhinitis 11/06/2014  . Spinal stenosis 11/06/2014  . Osteopenia 05/09/2009  . Chronic kidney disease (CKD), stage III (moderate) 04/20/2008    Past Surgical History:  Procedure Laterality Date  . ABDOMINAL HYSTERECTOMY    . CHOLECYSTECTOMY    . HERNIA REPAIR      Family History  Problem Relation Age of Onset  . Cancer Mother   . Cancer Sister   . Breast cancer Neg Hx     Social History   Social History  . Marital status: Widowed    Spouse name: N/A  . Number of children: N/A  . Years of education: N/A    Occupational History  . unemployed    Social History Main Topics  . Smoking status: Never Smoker  . Smokeless tobacco: Never Used  . Alcohol use No  . Drug use: No  . Sexual activity: No   Other Topics Concern  . Not on file   Social History Narrative  . No narrative on file     Current Outpatient Prescriptions:  .  aspirin EC 81 MG tablet, Take 81 mg by mouth daily., Disp: , Rfl:  .  Cholecalciferol (VITAMIN D) 2000 UNITS tablet, Take 1 tablet by mouth daily., Disp: , Rfl:  .  Glucosamine-Chondroitin 250-200 MG TABS, Take 1 tablet by mouth daily., Disp: , Rfl:  .  GLUCOSE BLOOD VI, ONETOUCH ULTRA BLUE (In Vitro Strip)  use as directed for 0 days  Quantity: 100.00;  Refills: 0   Ordered :25-Jul-2010  Alba CorySOWLES, Ishmel Acevedo MD;  Mora ApplStarted 21-December-2008 Active Comments: DX: 790.29, Disp: , Rfl:  .  Multiple Vitamin (MULTI-VITAMINS) TABS, Take 1 tablet by mouth daily., Disp: , Rfl:  .  pravastatin (PRAVACHOL) 40 MG tablet, TAKE 1 TABLET (40 MG TOTAL) BY MOUTH DAILY., Disp: 90 tablet, Rfl: 1 .  valsartan (DIOVAN) 80 MG tablet, TAKE 1 TABLET BY MOUTH EVERY DAY, Disp: 90 tablet, Rfl: 0  Allergies  Allergen Reactions  .  Sulfa Antibiotics Rash     ROS  Ten systems reviewed and is negative except as mentioned in HPI   Objective  Vitals:   07/16/16 1428  BP: 124/68  Pulse: 96  Resp: 16  Temp: 97.9 F (36.6 C)  SpO2: 92%  Weight: 149 lb 3 oz (67.7 kg)  Height: 5\' 6"  (1.676 m)    Body mass index is 24.08 kg/m.  Physical Exam  Constitutional: Patient appears well-developed and well-nourished. Obese  No distress.  HEENT: head atraumatic, normocephalic, pupils equal and reactive to light,  neck supple, throat within normal limits Cardiovascular: Normal rate, regular rhythm and normal heart sounds.  No murmur heard. 1 plus pitting  BLE edema of both ankle, varicose veins. Pulmonary/Chest: Effort normal and breath sounds normal. No respiratory distress. Abdominal: Soft.  There is  no tenderness. Psychiatric: Patient has a normal mood and affect. behavior is normal. Judgment and thought content normal. Muscular Skeletal: pain during palpation of left rib cage  PHQ2/9: Depression screen [facility] 2/9 03/28/2016 11/26/2015 07/27/2015 03/26/2015  Decreased Interest 0 0 0 0  Down, Depressed, Hopeless 0 0 0 0  PHQ - 2 Score 0 0 0 0     Fall Risk: Fall Risk  03/28/2016 11/26/2015 07/27/2015 03/26/2015  Falls in the past year? No No Yes Yes  Number falls in past yr: - - 1 1  Injury with Fall? - - No No  Risk for fall due to : - - Impaired balance/gait -     Assessment & Plan  1. Closed fracture of multiple ribs of left side with routine healing, subsequent encounter  Continue Tylenol, doing well  2. Bilateral lower extremity edema  Secondary to not sleeping in her bed since she broke her ribs and she has been sleeping on her recliner. Advised compression stocking hoses.  3. History of recent fall  She was multitasking ( baking pies on Christmas day ), discussed PT but she wants to hold off.

## 2016-07-28 ENCOUNTER — Ambulatory Visit (INDEPENDENT_AMBULATORY_CARE_PROVIDER_SITE_OTHER): Payer: Medicare Other | Admitting: Family Medicine

## 2016-07-28 ENCOUNTER — Encounter: Payer: Self-pay | Admitting: Family Medicine

## 2016-07-28 VITALS — BP 122/74 | HR 74 | Temp 97.8°F | Resp 16 | Ht 66.0 in | Wt 149.0 lb

## 2016-07-28 DIAGNOSIS — J069 Acute upper respiratory infection, unspecified: Secondary | ICD-10-CM

## 2016-07-28 DIAGNOSIS — S2242XD Multiple fractures of ribs, left side, subsequent encounter for fracture with routine healing: Secondary | ICD-10-CM | POA: Diagnosis not present

## 2016-07-28 DIAGNOSIS — F3340 Major depressive disorder, recurrent, in remission, unspecified: Secondary | ICD-10-CM

## 2016-07-28 DIAGNOSIS — B9789 Other viral agents as the cause of diseases classified elsewhere: Secondary | ICD-10-CM

## 2016-07-28 DIAGNOSIS — N183 Chronic kidney disease, stage 3 (moderate): Secondary | ICD-10-CM | POA: Diagnosis not present

## 2016-07-28 DIAGNOSIS — E785 Hyperlipidemia, unspecified: Secondary | ICD-10-CM

## 2016-07-28 DIAGNOSIS — I10 Essential (primary) hypertension: Secondary | ICD-10-CM | POA: Diagnosis not present

## 2016-07-28 DIAGNOSIS — E1122 Type 2 diabetes mellitus with diabetic chronic kidney disease: Secondary | ICD-10-CM

## 2016-07-28 LAB — COMPLETE METABOLIC PANEL WITH GFR
ALT: 13 U/L (ref 6–29)
AST: 21 U/L (ref 10–35)
Albumin: 4.2 g/dL (ref 3.6–5.1)
Alkaline Phosphatase: 80 U/L (ref 33–130)
BILIRUBIN TOTAL: 0.5 mg/dL (ref 0.2–1.2)
BUN: 21 mg/dL (ref 7–25)
CHLORIDE: 109 mmol/L (ref 98–110)
CO2: 25 mmol/L (ref 20–31)
Calcium: 9.5 mg/dL (ref 8.6–10.4)
Creat: 1.1 mg/dL — ABNORMAL HIGH (ref 0.60–0.93)
GFR, EST AFRICAN AMERICAN: 56 mL/min — AB (ref >=60)
GFR, EST NON AFRICAN AMERICAN: 49 mL/min — AB (ref >=60)
Glucose, Bld: 102 mg/dL — ABNORMAL HIGH (ref 65–99)
POTASSIUM: 4.6 mmol/L (ref 3.5–5.3)
SODIUM: 143 mmol/L (ref 135–146)
Total Protein: 7 g/dL (ref 6.1–8.1)

## 2016-07-28 LAB — POCT GLYCOSYLATED HEMOGLOBIN (HGB A1C): HEMOGLOBIN A1C: 5.8

## 2016-07-28 MED ORDER — VALSARTAN 80 MG PO TABS: 80.0000 mg | ORAL_TABLET | Freq: Every day | ORAL | 1 refills | Status: DC

## 2016-07-28 MED ORDER — PRAVASTATIN SODIUM 40 MG PO TABS: ORAL_TABLET | ORAL | 1 refills | Status: DC

## 2016-07-28 NOTE — Progress Notes (Signed)
Name: Melanie Gray   MRN: [id number]    DOB: 08/17/1940   Date:07/28/2016       Progress Note  Subjective  Chief Complaint  Chief Complaint  Patient presents with  . Medication Refill    4 month F/U  . Diabetes    Checks once a week on Saturdays, Low-99 Average-102  . Hyperlipidemia  . Hypertension    Denies any symptoms  . Nasal Congestion    Onset- 1 week, Dry Coughing causing her to have a sore, scratchy throat, trying to clear her throat, runny nose.     HPI  HTN: she is now only Diovan, off HCTZ and bp is at goal. BP at home has been around 130's. No chest pain, palpitation or SOB  URI: she states symptoms started in the middle of the night, clear rhinorrhea, post-nasal drip that makes her clear her throat, no fever or chills. She has not tried any medications yet.   Diabetes with renal manifestation: GFR stable, on ARB, she denies polyphagia, polydipsia or polyuria. She has not been as strict with her diet and weight is stable now, no longer losing it. We will recheck labs  Hyperlipidemia: taking Pravastatin, denies myalgia , compliant with medication.   Major Depression: She denies being depressed, she states she is an introvert, she isolates herself on purpose. She seems to be always concerned about her health lately, advised to volunteer and engage with other people. She is not socializing with her family or friends. No longer going to church. Explained importance of having regular visits with loved ones. She has been worried about her kids, she feels like she is a burden, sister diagnosed with cancer and is in [place]  Fracture of ribs: after fall, doing well, still has mild pain, able to breath without discomfort and has been able to sleep on her own bed, instead of her recliner.    Patient Active Problem List   Diagnosis Date Noted  . Benign hypertension 11/06/2014  . Chronic anemia 11/06/2014  . Seborrheic dermatitis 11/06/2014  . Diabetes mellitus with renal  manifestation (HCC) 11/06/2014  . Dyslipidemia 11/06/2014  . Cephalalgia 11/06/2014  . Deafness, sensorineural 11/06/2014  . Liver regeneration 11/06/2014  . Arthritis of knee, degenerative 11/06/2014  . Panic attack 11/06/2014  . Restless legs syndrome 11/06/2014  . Allergic rhinitis 11/06/2014  . Spinal stenosis 11/06/2014  . Osteopenia 05/09/2009  . Chronic kidney disease (CKD), stage III (moderate) 04/20/2008    Past Surgical History:  Procedure Laterality Date  . ABDOMINAL HYSTERECTOMY    . CHOLECYSTECTOMY    . HERNIA REPAIR      Family History  Problem Relation Age of Onset  . Cancer Mother   . Cancer Sister   . Breast cancer Neg Hx     Social History   Social History  . Marital status: Widowed    Spouse name: N/A  . Number of children: N/A  . Years of education: N/A   Occupational History  . unemployed    Social History Main Topics  . Smoking status: Never Smoker  . Smokeless tobacco: Never Used  . Alcohol use No  . Drug use: No  . Sexual activity: No   Other Topics Concern  . Not on file   Social History Narrative  . No narrative on file     Current Outpatient Prescriptions:  .  aspirin EC 81 MG tablet, Take 81 mg by mouth daily., Disp: , Rfl:  .  Cholecalciferol (VITAMIN D)  2000 UNITS tablet, Take 1 tablet by mouth daily., Disp: , Rfl:  .  Glucosamine-Chondroitin 250-200 MG TABS, Take 1 tablet by mouth daily., Disp: , Rfl:  .  GLUCOSE BLOOD VI, ONETOUCH ULTRA BLUE (In Vitro Strip)  use as directed for 0 days  Quantity: 100.00;  Refills: 0   Ordered :25-Jul-2010  Alba CorySOWLES, Dody Smartt MD;  Mora ApplStarted 21-December-2008 Active Comments: DX: 790.29, Disp: , Rfl:  .  Multiple Vitamin (MULTI-VITAMINS) TABS, Take 1 tablet by mouth daily., Disp: , Rfl:  .  pravastatin (PRAVACHOL) 40 MG tablet, TAKE 1 TABLET (40 MG TOTAL) BY MOUTH DAILY., Disp: 90 tablet, Rfl: 1 .  valsartan (DIOVAN) 80 MG tablet, Take 1 tablet (80 mg total) by mouth daily., Disp: 90 tablet, Rfl:  1  Allergies  Allergen Reactions  . Sulfa Antibiotics Rash     ROS  Constitutional: Negative for fever or weight change.  Respiratory: Negative for cough and shortness of breath.   Cardiovascular: Negative for chest pain or palpitations.  Gastrointestinal: Negative for abdominal pain, no bowel changes.  Musculoskeletal: Positive for gait problem, but no  joint swelling.  Skin: Negative for rash.  Neurological: Negative for dizziness or headache.  No other specific complaints in a complete review of systems (except as listed in HPI above).  Objective  Vitals:   07/28/16 1049  BP: 122/74  Pulse: 74  Resp: 16  Temp: 97.8 F (36.6 C)  TempSrc: Oral  SpO2: 94%  Weight: 149 lb (67.6 kg)  Height: 5\' 6"  (1.676 m)    Body mass index is 24.05 kg/m.  Physical Exam  Constitutional: Patient appears well-developed and well-nourished. Obese  No distress.  HEENT: head atraumatic, normocephalic, pupils equal and reactive to light, ears normal TM, neck supple, throat within normal limits Cardiovascular: Normal rate, regular rhythm and normal heart sounds.  No murmur heard. No BLE edema. Pulmonary/Chest: Effort normal and breath sounds normal. No respiratory distress. Abdominal: Soft.  There is no tenderness. Psychiatric: Patient has a normal mood and affect. behavior is normal. Judgment and thought content normal. Muscular Skeletal: pain during palpation of left lateral rib cage, lower aspect, uses cane to assist with ambulation , hammer toes  Recent Results (from the past 2160 hour(s))  POCT HgB A1C     Status: Normal   Collection Time: 07/28/16 10:58 AM  Result Value Ref Range   Hemoglobin A1C 5.8       PHQ2/9: [facility] 2/9 07/28/2016 03/28/2016 11/26/2015 07/27/2015 03/26/2015  Decreased Interest 0 0 0 0 0  Down, Depressed, Hopeless 0 0 0 0 0  PHQ - 2 Score 0 0 0 0 0     Fall Risk: Fall Risk  07/28/2016 03/28/2016 11/26/2015 07/27/2015 03/26/2015  Falls in the  past year? Yes No No Yes Yes  Number falls in past yr: 1 - - 1 1  Injury with Fall? Yes - - No No  Risk for fall due to : - - - Impaired balance/gait -     Functional Status Survey: Is the patient deaf or have difficulty hearing?: No Does the patient have difficulty seeing, even when wearing glasses/contacts?: No Does the patient have difficulty concentrating, remembering, or making decisions?: No Does the patient have difficulty walking or climbing stairs?: No Does the patient have difficulty dressing or bathing?: No Does the patient have difficulty doing errands alone such as visiting a doctor's office or shopping?: No    Assessment & Plan  1. Type 2 diabetes mellitus with stage  3 chronic kidney disease, without long-term current use of insulin (HCC)  - POCT HgB A1C - COMPLETE METABOLIC PANEL WITH GFR  2. Closed fracture of multiple ribs of left side with routine healing, subsequent encounter  Pain is under better control, able to sleep in her bed again   3. Dyslipidemia  - pravastatin (PRAVACHOL) 40 MG tablet; TAKE 1 TABLET (40 MG TOTAL) BY MOUTH DAILY.  Dispense: 90 tablet; Refill: 1  4. Hypertension, benign  - valsartan (DIOVAN) 80 MG tablet; Take 1 tablet (80 mg total) by mouth daily.  Dispense: 90 tablet; Refill: 1 - COMPLETE METABOLIC PANEL WITH GFR  5. Depression, major, recurrent, in remission [facility])  [place] from [place], she is feeling well  6. Viral upper respiratory tract infection  Just started last night, advised Coricidin HBP

## 2016-11-25 ENCOUNTER — Ambulatory Visit: Payer: Self-pay | Admitting: Family Medicine

## 2016-11-27 ENCOUNTER — Encounter: Payer: Self-pay | Admitting: Family Medicine

## 2016-11-27 ENCOUNTER — Other Ambulatory Visit: Payer: Self-pay | Admitting: Family Medicine

## 2016-11-27 ENCOUNTER — Ambulatory Visit (INDEPENDENT_AMBULATORY_CARE_PROVIDER_SITE_OTHER): Payer: Medicare Other | Admitting: Family Medicine

## 2016-11-27 VITALS — BP 126/80 | HR 85 | Temp 97.8°F | Resp 16 | Ht 66.0 in | Wt 145.6 lb

## 2016-11-27 DIAGNOSIS — R350 Frequency of micturition: Secondary | ICD-10-CM | POA: Diagnosis not present

## 2016-11-27 DIAGNOSIS — N183 Chronic kidney disease, stage 3 unspecified: Secondary | ICD-10-CM

## 2016-11-27 DIAGNOSIS — I83811 Varicose veins of right lower extremities with pain: Secondary | ICD-10-CM

## 2016-11-27 DIAGNOSIS — E785 Hyperlipidemia, unspecified: Secondary | ICD-10-CM | POA: Diagnosis not present

## 2016-11-27 DIAGNOSIS — I10 Essential (primary) hypertension: Secondary | ICD-10-CM | POA: Diagnosis not present

## 2016-11-27 DIAGNOSIS — E1122 Type 2 diabetes mellitus with diabetic chronic kidney disease: Secondary | ICD-10-CM

## 2016-11-27 DIAGNOSIS — N3941 Urge incontinence: Secondary | ICD-10-CM

## 2016-11-27 DIAGNOSIS — F3340 Major depressive disorder, recurrent, in remission, unspecified: Secondary | ICD-10-CM | POA: Diagnosis not present

## 2016-11-27 DIAGNOSIS — D649 Anemia, unspecified: Secondary | ICD-10-CM | POA: Diagnosis not present

## 2016-11-27 LAB — COMPLETE METABOLIC PANEL WITH GFR
ALT: 13 U/L (ref 6–29)
AST: 22 U/L (ref 10–35)
Albumin: 4.4 g/dL (ref 3.6–5.1)
Alkaline Phosphatase: 75 U/L (ref 33–130)
BILIRUBIN TOTAL: 0.6 mg/dL (ref 0.2–1.2)
BUN: 24 mg/dL (ref 7–25)
CO2: 24 mmol/L (ref 20–31)
CREATININE: 1.31 mg/dL — AB (ref 0.60–0.93)
Calcium: 9.7 mg/dL (ref 8.6–10.4)
Chloride: 108 mmol/L (ref 98–110)
GFR, Est African American: 45 mL/min — ABNORMAL LOW (ref >=60)
GFR, Est Non African American: 39 mL/min — ABNORMAL LOW (ref >=60)
GLUCOSE: 98 mg/dL (ref 65–99)
Potassium: 4.5 mmol/L (ref 3.5–5.3)
SODIUM: 144 mmol/L (ref 135–146)
TOTAL PROTEIN: 7.1 g/dL (ref 6.1–8.1)

## 2016-11-27 LAB — CBC WITH DIFFERENTIAL/PLATELET
BASOS ABS: 43 {cells}/uL (ref 0–200)
Basophils Relative: 1 %
EOS ABS: 43 {cells}/uL (ref 15–500)
Eosinophils Relative: 1 %
HCT: 36.2 % (ref 35.0–45.0)
Hemoglobin: 11.4 g/dL — ABNORMAL LOW (ref 11.7–15.5)
LYMPHS PCT: 31 %
Lymphs Abs: 1333 cells/uL (ref 850–3900)
MCH: 27.5 pg (ref 27.0–33.0)
MCHC: 31.5 g/dL — ABNORMAL LOW (ref 32.0–36.0)
MCV: 87.2 fL (ref 80.0–100.0)
MONOS PCT: 10 %
MPV: 10.1 fL (ref 7.5–12.5)
Monocytes Absolute: 430 cells/uL (ref 200–950)
NEUTROS ABS: 2451 {cells}/uL (ref 1500–7800)
Neutrophils Relative %: 57 %
PLATELETS: 227 10*3/uL (ref 140–400)
RBC: 4.15 MIL/uL (ref 3.80–5.10)
RDW: 15.8 % — ABNORMAL HIGH (ref 11.0–15.0)
WBC: 4.3 10*3/uL (ref 3.8–10.8)

## 2016-11-27 LAB — LIPID PANEL
CHOL/HDL RATIO: 1.6 ratio (ref <5.0)
Cholesterol: 203 mg/dL — ABNORMAL HIGH (ref <200)
HDL: 129 mg/dL (ref >50)
LDL CALC: 66 mg/dL (ref <100)
Triglycerides: 39 mg/dL (ref <150)
VLDL: 8 mg/dL (ref <30)

## 2016-11-27 LAB — POCT GLYCOSYLATED HEMOGLOBIN (HGB A1C): Hemoglobin A1C: 5.5

## 2016-11-27 LAB — POCT UA - MICROALBUMIN: Microalbumin Ur, POC: 20 mg/L

## 2016-11-27 MED ORDER — VALSARTAN 80 MG PO TABS: 80.0000 mg | ORAL_TABLET | Freq: Every day | ORAL | 1 refills | Status: DC

## 2016-11-27 MED ORDER — PRAVASTATIN SODIUM 40 MG PO TABS: ORAL_TABLET | ORAL | 1 refills | Status: DC

## 2016-11-27 NOTE — Progress Notes (Signed)
Name: Margarito CourserKay Guterrez   MRN: [id number]    DOB: 02-08-1941   Date:11/27/2016       Progress Note  Subjective  Chief Complaint  Chief Complaint  Patient presents with  . Medication Refill    4 month F/U  . Diabetes    Checks every Saturday, Runs around 97-102  . Hypertension    Denies any symptoms  . Hyperlipidemia  . Varicose Veins    Right Leg has multiple varicose veins for years but they are not hurting her. She is wearing compression hoses to help relieve the pain.    HPI  HTN: she is now only Diovan, off HCTZ and bp is at goal. BP at home has been around 120's-130's No chest pain, palpitation or SOB  Diabetes with renal manifestation: GFR stable, on ARB, urine micro is back to normal,  she denies polyphagia, polydipsia or polyuria. She follows a diabetic diet  Hyperlipidemia: taking Pravastatin, denies myalgia , compliant with medication.   Major Depression: She denies being depressed, she states she is an introvert, she isolates herself on purpose. She seems to be always concerned about her health lately, advised to volunteer and engage with other people. She is not socializing with her family or friends. No longer going to church. Explained importance of having regular visits with loved ones. She states she feels fine, she does not feel depressed  Varicose veins: she has noticed swelling, painful and bulging veins on right lower leg.   Urge incontinence: she states not going to church because has to void during the service, we will check urine culture and if negative try Vesicare   Patient Active Problem List   Diagnosis Date Noted  . Benign hypertension 11/06/2014  . Chronic anemia 11/06/2014  . Seborrheic dermatitis 11/06/2014  . Diabetes mellitus with renal manifestation (HCC) 11/06/2014  . Dyslipidemia 11/06/2014  . Cephalalgia 11/06/2014  . Deafness, sensorineural 11/06/2014  . Liver regeneration 11/06/2014  . Arthritis of knee, degenerative 11/06/2014  . Panic  attack 11/06/2014  . Restless legs syndrome 11/06/2014  . Allergic rhinitis 11/06/2014  . Spinal stenosis 11/06/2014  . Osteopenia 05/09/2009  . Chronic kidney disease (CKD), stage III (moderate) 04/20/2008    Past Surgical History:  Procedure Laterality Date  . ABDOMINAL HYSTERECTOMY    . CHOLECYSTECTOMY    . HERNIA REPAIR      Family History  Problem Relation Age of Onset  . Cancer Mother   . Cancer Sister   . Breast cancer Neg Hx     Social History   Social History  . Marital status: Widowed    Spouse name: N/A  . Number of children: N/A  . Years of education: N/A   Occupational History  . unemployed    Social History Main Topics  . Smoking status: Never Smoker  . Smokeless tobacco: Never Used  . Alcohol use No  . Drug use: No  . Sexual activity: No   Other Topics Concern  . Not on file   Social History Narrative  . No narrative on file     Current Outpatient Prescriptions:  .  aspirin EC 81 MG tablet, Take 81 mg by mouth daily., Disp: , Rfl:  .  Cholecalciferol (VITAMIN D) 2000 UNITS tablet, Take 1 tablet by mouth daily., Disp: , Rfl:  .  Glucosamine-Chondroitin 250-200 MG TABS, Take 1 tablet by mouth daily., Disp: , Rfl:  .  GLUCOSE BLOOD VI, ONETOUCH ULTRA BLUE (In Vitro Strip)  use as directed for  0 days  Quantity: 100.00;  Refills: 0   Ordered :25-Jul-2010  Alba Cory MD;  Mora Appl 21-December-2008 Active Comments: DX: 790.29, Disp: , Rfl:  .  Multiple Vitamin (MULTI-VITAMINS) TABS, Take 1 tablet by mouth daily., Disp: , Rfl:  .  pravastatin (PRAVACHOL) 40 MG tablet, TAKE 1 TABLET (40 MG TOTAL) BY MOUTH DAILY., Disp: 90 tablet, Rfl: 1 .  valsartan (DIOVAN) 80 MG tablet, Take 1 tablet (80 mg total) by mouth daily., Disp: 90 tablet, Rfl: 1  Allergies  Allergen Reactions  . Sulfa Antibiotics Rash     ROS  Constitutional: Negative for fever or significant weight change.  Respiratory: Negative for cough and shortness of breath.   Cardiovascular:  Negative for chest pain or palpitations.  Gastrointestinal: Negative for abdominal pain, no bowel changes.  Musculoskeletal: Positive for gait problem no  joint swelling.  Skin: Negative for rash.  Neurological: Negative for dizziness or headache.  No other specific complaints in a complete review of systems (except as listed in HPI above).  Objective  Vitals:   11/27/16 1056  BP: 126/80  Pulse: 85  Resp: 16  Temp: 97.8 F (36.6 C)  TempSrc: Oral  SpO2: 92%  Weight: 145 lb 9.6 oz (66 kg)  Height: 5\' 6"  (1.676 m)    Body mass index is 23.5 kg/m.  Physical Exam  Constitutional: Patient appears well-developed and well-nourished. Obese  No distress.  HEENT: head atraumatic, normocephalic, pupils equal and reactive to light, ears normal TM, neck supple, throat within normal limits Cardiovascular: Normal rate, regular rhythm and normal heart sounds.  No murmur heard. Varicose veins on right leg, tenderNo BLE edema. Pulmonary/Chest: Effort normal and breath sounds normal. No respiratory distress. Abdominal: Soft.  There is no tenderness. Psychiatric: Patient has a normal mood and affect. behavior is normal. Judgment and thought content normal. Muscular Skeletal: using cane to help with balance.   Recent Results (from the past 2160 hour(s))  POCT HgB A1C     Status: None   Collection Time: 11/27/16 10:53 AM  Result Value Ref Range   Hemoglobin A1C 5.5   POCT UA - Microalbumin     Status: Normal   Collection Time: 11/27/16 10:53 AM  Result Value Ref Range   Microalbumin Ur, POC 20 mg/L   Creatinine, POC  mg/dL   Albumin/Creatinine Ratio, Urine, POC        PHQ2/9: Depression screen [facility] 2/9 11/27/2016 07/28/2016 03/28/2016 11/26/2015 07/27/2015  Decreased Interest 0 0 0 0 0  Down, Depressed, Hopeless 0 0 0 0 0  PHQ - 2 Score 0 0 0 0 0     Fall Risk: Fall Risk  11/27/2016 07/28/2016 03/28/2016 11/26/2015 07/27/2015  Falls in the past year? Yes Yes No No Yes  Number falls in  past yr: 1 1 - - 1  Injury with Fall? Yes Yes - - No  Risk for fall due to : - - - - Impaired balance/gait     Functional Status Survey: Is the patient deaf or have difficulty hearing?: No Does the patient have difficulty seeing, even when wearing glasses/contacts?: No Does the patient have difficulty concentrating, remembering, or making decisions?: No Does the patient have difficulty walking or climbing stairs?: Yes (Walks with a cane) Does the patient have difficulty dressing or bathing?: No Does the patient have difficulty doing errands alone such as visiting a doctor's office or shopping?: No   Assessment & Plan  1. Type 2 diabetes mellitus with stage 3 chronic kidney  disease, without long-term current use of insulin (HCC)  - POCT HgB A1C - POCT UA - Microalbumin  2. Varicose veins of leg with pain, right  - Ambulatory referral to Vascular Surgery  3. Dyslipidemia  - pravastatin (PRAVACHOL) 40 MG tablet; TAKE 1 TABLET (40 MG TOTAL) BY MOUTH DAILY.  Dispense: 90 tablet; Refill: 1 - Lipid panel  4. Hypertension, benign  - valsartan (DIOVAN) 80 MG tablet; Take 1 tablet (80 mg total) by mouth daily.  Dispense: 90 tablet; Refill: 1 - COMPLETE METABOLIC PANEL WITH GFR - CBC with Differential/Platelet  5. Depression, major, recurrent, in remission (HCC)  At goal   6. Chronic kidney disease (CKD), stage III (moderate)  Recheck labs  7. Urge incontinence  We will check urine culture and if negative we will try Vesica  8. Urinary frequency  - Urine culture

## 2016-11-30 ENCOUNTER — Other Ambulatory Visit: Payer: Self-pay | Admitting: Family Medicine

## 2016-11-30 DIAGNOSIS — D649 Anemia, unspecified: Secondary | ICD-10-CM

## 2016-11-30 NOTE — Progress Notes (Unsigned)
Please add labs 

## 2016-12-01 ENCOUNTER — Ambulatory Visit (INDEPENDENT_AMBULATORY_CARE_PROVIDER_SITE_OTHER): Payer: Medicare Other | Admitting: Vascular Surgery

## 2016-12-01 ENCOUNTER — Telehealth: Payer: Self-pay

## 2016-12-01 ENCOUNTER — Encounter (INDEPENDENT_AMBULATORY_CARE_PROVIDER_SITE_OTHER): Payer: Self-pay | Admitting: Vascular Surgery

## 2016-12-01 VITALS — BP 152/86 | HR 64 | Resp 16 | Ht 66.0 in | Wt 146.8 lb

## 2016-12-01 DIAGNOSIS — E785 Hyperlipidemia, unspecified: Secondary | ICD-10-CM

## 2016-12-01 DIAGNOSIS — I872 Venous insufficiency (chronic) (peripheral): Secondary | ICD-10-CM | POA: Insufficient documentation

## 2016-12-01 DIAGNOSIS — N183 Chronic kidney disease, stage 3 unspecified: Secondary | ICD-10-CM

## 2016-12-01 DIAGNOSIS — I8311 Varicose veins of right lower extremity with inflammation: Secondary | ICD-10-CM | POA: Diagnosis not present

## 2016-12-01 DIAGNOSIS — M79604 Pain in right leg: Secondary | ICD-10-CM | POA: Diagnosis not present

## 2016-12-01 DIAGNOSIS — M79605 Pain in left leg: Secondary | ICD-10-CM

## 2016-12-01 DIAGNOSIS — I8312 Varicose veins of left lower extremity with inflammation: Secondary | ICD-10-CM

## 2016-12-01 DIAGNOSIS — M79609 Pain in unspecified limb: Secondary | ICD-10-CM | POA: Insufficient documentation

## 2016-12-01 LAB — IRON,TIBC AND FERRITIN PANEL
%SAT: 21 % (ref 11–50)
Ferritin: 225 ng/mL (ref 20–288)
IRON: 63 ug/dL (ref 45–160)
TIBC: 305 ug/dL (ref 250–450)

## 2016-12-01 NOTE — Telephone Encounter (Signed)
-----   Message from Alba CoryKrichna Sowles, MD sent at 11/30/2016  4:37 PM EDT ----- Hi Mrs. Melanie Gray,   You have mild anemia, and your kidney function has also dropped a little ( CKI stage III)  Normal sugar and liver enzymes Cholesterol panel is unremarkable - continue medications Avoid taking anti-inflammatory medications such as : Aleve, Motrin, Advil. You can take Tylenol for pain.  We will add iron studies ( iron, ferritin and TIB ) to find out if you have iron deficiency anemia or anemia of chronic disease.  Take care,   Dr. Carlynn PurlSowles

## 2016-12-01 NOTE — Progress Notes (Signed)
MRN : [id number]  Melanie Gray is a 76 y.o. (1940/10/13) female who presents with chief complaint of No chief complaint on file.   History of Present Illness: The patient is seen for evaluation of symptomatic varicose veins. The patient relates burning and stinging which worsened steadily throughout the course of the day, particularly with standing. The patient also notes an aching and throbbing pain over the varicosities, particularly with prolonged dependent positions. The symptoms are significantly improved with elevation.  The patient also notes that during hot weather the symptoms are greatly intensified. The patient states the pain from the varicose veins interferes with work, daily exercise, shopping and household maintenance. At this point, the symptoms are persistent and severe enough that they're having a negative impact on lifestyle and are interfering with daily activities.  There is no history of DVT, PE or superficial thrombophlebitis. There is no history of ulceration or hemorrhage. The patient denies a significant family history of varicose veins. OB history: P2  The patient has not worn graduated compression in the past. At the present time the patient has not been using over-the-counter analgesics. There is no history of prior surgical intervention or sclerotherapy.    No outpatient prescriptions have been marked as taking for the 12/01/16 encounter (Appointment) with Gilda Crease, Latina Craver, MD.    Past Medical History:  Diagnosis Date  . Diabetes mellitus without complication (HCC)   . Hyperlipidemia   . Hypertension     Past Surgical History:  Procedure Laterality Date  . ABDOMINAL HYSTERECTOMY    . CHOLECYSTECTOMY    . HERNIA REPAIR      Social History Social History  Substance Use Topics  . Smoking status: Never Smoker  . Smokeless tobacco: Never Used  . Alcohol use No    Family History Family History  Problem Relation Age of Onset  . Cancer Mother   .  Cancer Sister   . Breast cancer Neg Hx   No family history of bleeding/clotting disorders, porphyria or autoimmune disease   Allergies  Allergen Reactions  . Sulfa Antibiotics Rash     REVIEW OF SYSTEMS (Negative unless checked)  Constitutional: [] Weight loss  [] Fever  [] Chills Cardiac: [] Chest pain   [] Chest pressure   [] Palpitations   [] Shortness of breath when laying flat   [] Shortness of breath with exertion. Vascular:  [] Pain in legs with walking   [x] Pain in legs with standing  [] History of DVT   [] Phlebitis   [x] Swelling in legs   [x] Varicose veins   [] Non-healing ulcers Pulmonary:   [] Uses home oxygen   [] Productive cough   [] Hemoptysis   [] Wheeze  [] COPD   [] Asthma Neurologic:  [] Dizziness   [] Seizures   [] History of stroke   [] History of TIA  [] Aphasia   [] Vissual changes   [] Weakness or numbness in arm   [] Weakness or numbness in leg Musculoskeletal:   [x] Joint swelling   [x] Joint pain   [] Low back pain Hematologic:  [] Easy bruising  [] Easy bleeding   [] Hypercoagulable state   [] Anemic Gastrointestinal:  [] Diarrhea   [] Vomiting  [] Gastroesophageal reflux/heartburn   [] Difficulty swallowing. Genitourinary:  [] Chronic kidney disease   [] Difficult urination  [] Frequent urination   [] Blood in urine Skin:  [] Rashes   [] Ulcers  Psychological:  [] History of anxiety   []  History of major depression.  Physical Examination  There were no vitals filed for this visit. There is no height or weight on file to calculate BMI. Gen: WD/WN, NAD Head: [place]/AT, No temporalis wasting.  Ear/Nose/Throat: Hearing grossly intact, nares w/o erythema or drainage, poor dentition Eyes: PER, EOMI, sclera nonicteric.  Neck: Supple, no masses.  No bruit or JVD.  Pulmonary:  Good air movement, clear to auscultation bilaterally, no use of accessory muscles.  Cardiac: RRR, normal S1, S2, no Murmurs. Vascular: Large varicosities present extensively greater than 10 mm bilaterally.  Modeerate venous stasis  changes to the legs bilaterally.  2+ soft pitting edema Vessel Right Left  Radial Palpable Palpable  Ulnar Palpable Palpable  Brachial Palpable Palpable  Carotid Palpable Palpable  Femoral Palpable Palpable  Popliteal Palpable Palpable  PT 1+ Palpable 1+ Palpable  DP 2+ Palpable 2+ Palpable  Gastrointestinal: soft, non-distended. No guarding/no peritoneal signs.  Musculoskeletal: M/S 5/5 throughout.  Severe degenerative changes both knees no atrophy.  Neurologic: CN 2-12 intact. Pain and light touch intact in extremities.  Symmetrical.  Speech is fluent. Motor exam as listed above. Psychiatric: Judgment intact, Mood & affect appropriate for pt's clinical situation. Dermatologic: No rashes or ulcers noted.  No changes consistent with cellulitis. Lymph : No Cervical lymphadenopathy, no lichenification or skin changes of chronic lymphedema.  CBC Lab Results  Component Value Date   WBC 4.3 11/27/2016   HGB 11.4 (L) 11/27/2016   HCT 36.2 11/27/2016   MCV 87.2 11/27/2016   PLT 227 11/27/2016    BMET    Component Value Date/Time   NA 144 11/27/2016 1146   NA 143 11/26/2015 1218   K 4.5 11/27/2016 1146   K 4.1 04/20/2014 1319   CL 108 11/27/2016 1146   CO2 24 11/27/2016 1146   GLUCOSE 98 11/27/2016 1146   BUN 24 11/27/2016 1146   BUN 36 (H) 11/26/2015 1218   CREATININE 1.31 (H) 11/27/2016 1146   CALCIUM 9.7 11/27/2016 1146   GFRNONAA 39 (L) 11/27/2016 1146   GFRAA 45 (L) 11/27/2016 1146   Estimated Creatinine Clearance: 33.7 mL/min (A) (by C-G formula based on SCr of 1.31 mg/dL (H)).  COAG No results found for: INR, PROTIME  Radiology No results found.  Assessment/Plan 1. Varicose veins of both lower extremities with inflammation  Recommend:  The patient has large symptomatic varicose veins that are painful and associated with swelling.  I have had a long discussion with the patient regarding  varicose veins and why they cause symptoms.  Patient will begin wearing  graduated compression stockings class 1 on a daily basis, beginning first thing in the morning and removing them in the evening. The patient is instructed specifically not to sleep in the stockings.    The patient  will also begin using over-the-counter analgesics such as Motrin 600 mg po TID to help control the symptoms.    In addition, behavioral modification including elevation during the day will be initiated.    Pending the results of these changes the  patient will be reevaluated in three months.   An  ultrasound of the venous system will be obtained.   Further plans will be based on the ultrasound results and whether conservative therapies are successful at eliminating the pain and swelling.   2. Chronic venous insufficiency No surgery or intervention at this point in time.    I have had a long discussion with the patient regarding venous insufficiency and why it  causes symptoms. I have discussed with the patient the chronic skin changes that accompany venous insufficiency and the long term sequela such as infection and ulceration.  Patient will begin wearing graduated compression stockings class 1 (20-30 mmHg) or  compression wraps on a daily basis a prescription was given. The patient will put the stockings on first thing in the morning and removing them in the evening. The patient is instructed specifically not to sleep in the stockings.    In addition, behavioral modification including several periods of elevation of the lower extremities during the day will be continued. I have demonstrated that proper elevation is a position with the ankles at heart level.  The patient is instructed to begin routine exercise, especially walking on a daily basis  Patient should undergo duplex ultrasound of the venous system to ensure that DVT or reflux is not present.  Following the review of the ultrasound the patient will follow up in 2-3 months to reassess the degree of swelling and the control  that graduated compression stockings or compression wraps  is offering.   The patient can be assessed for a Lymph Pump at that time  3. Pain in both lower extremities See #1&2  4. Dyslipidemia Continue statin as ordered and reviewed, no changes at this time   5. Chronic kidney disease (CKD), stage III (moderate) Continue medications as already ordered, these medications have been reviewed and there are no changes at this time.  Avoid dehydration and nephrotoxic medications     Levora Dredge, MD  12/01/2016 8:31 AM

## 2017-02-27 ENCOUNTER — Telehealth: Payer: Self-pay | Admitting: Family Medicine

## 2017-02-27 NOTE — Telephone Encounter (Signed)
Pt is taking valsartin. She has received a letter in the mail pertaining to the recall. Pt is requesting that you please prescribe something different. [facility]

## 2017-03-02 MED ORDER — LOSARTAN POTASSIUM 50 MG PO TABS: 50.0000 mg | ORAL_TABLET | Freq: Every day | ORAL | 0 refills | Status: DC

## 2017-03-25 ENCOUNTER — Ambulatory Visit (INDEPENDENT_AMBULATORY_CARE_PROVIDER_SITE_OTHER): Payer: Medicare Other

## 2017-03-25 ENCOUNTER — Encounter (INDEPENDENT_AMBULATORY_CARE_PROVIDER_SITE_OTHER): Payer: Self-pay | Admitting: Vascular Surgery

## 2017-03-25 ENCOUNTER — Ambulatory Visit (INDEPENDENT_AMBULATORY_CARE_PROVIDER_SITE_OTHER): Payer: Medicare Other | Admitting: Vascular Surgery

## 2017-03-25 VITALS — BP 158/89 | HR 60 | Resp 16 | Wt 146.0 lb

## 2017-03-25 DIAGNOSIS — M170 Bilateral primary osteoarthritis of knee: Secondary | ICD-10-CM

## 2017-03-25 DIAGNOSIS — I872 Venous insufficiency (chronic) (peripheral): Secondary | ICD-10-CM

## 2017-03-25 DIAGNOSIS — I8311 Varicose veins of right lower extremity with inflammation: Secondary | ICD-10-CM | POA: Diagnosis not present

## 2017-03-25 DIAGNOSIS — I8312 Varicose veins of left lower extremity with inflammation: Secondary | ICD-10-CM | POA: Diagnosis not present

## 2017-03-25 NOTE — Progress Notes (Signed)
MRN : [id number]   Melanie Gray is a 76 y.o. (September 11, 1940) female who presents with chief complaint of  Chief Complaint  Patient presents with  . Follow-up    4mo ble reflux  .  History of Present Illness:   The patient returns to the office for 3 month followup evaluation regarding leg pain, tender varicosities, and swelling. She is also here to discuss her venous reflux study and treatment options. The swelling has improved quite a bit and the pain associated with the swelling/tender varicosities have decreased substantially with the use of compression stockings and elevating the legs. There have not been any interval development of a ulcerations or wounds. She states that she has been wearing her compression stockings daily and noted these significant improvements but she thinks the stockings are no longer fitting well as they have been leaving a dark mark on her skin where the top band of the stocking sits. She states she plans to go to a medical supply store and be measured for stockings to make sure she is getting the right size.   The patient also states she elevates her legs during the day in her recliner periodically and at night in bed. Exercise is being done as well in the form of an exercise bike.  Venous reflux study today reveals venous incompetence in the right: common femoral vein, saphenous femoral junction, popliteal vein, and the great saphenous. She also has venous incompetence in the left: common femoral, saphenous femoral junction, popliteal vein, and the great saphenous. There is no evidence of DVT or superficial thrombus bilaterally.  Current Meds  Medication Sig  . aspirin EC 81 MG tablet Take 81 mg by mouth daily.  . Cholecalciferol (VITAMIN D) 2000 UNITS tablet Take 1 tablet by mouth daily.  . Glucosamine-Chondroitin 250-200 MG TABS Take 1 tablet by mouth daily.  Marland Kitchen. GLUCOSE BLOOD VI ONETOUCH ULTRA BLUE (In Vitro Strip)  use as directed for 0 days  Quantity: 100.00;   Refills: 0   Ordered :25-Jul-2010  Alba CorySOWLES, KRICHNA MD;  Mora ApplStarted 21-December-2008 Active Comments: DX: 790.29  . losartan (COZAAR) 50 MG tablet Take 1 tablet (50 mg total) by mouth daily. In place of Valsartan  . Multiple Vitamin (MULTI-VITAMINS) TABS Take 1 tablet by mouth daily.  . pravastatin (PRAVACHOL) 40 MG tablet TAKE 1 TABLET (40 MG TOTAL) BY MOUTH DAILY.    Past Medical History:  Diagnosis Date  . Diabetes mellitus without complication (HCC)   . Hyperlipidemia   . Hypertension     Past Surgical History:  Procedure Laterality Date  . ABDOMINAL HYSTERECTOMY    . CHOLECYSTECTOMY    . HERNIA REPAIR      Social History Social History  Substance Use Topics  . Smoking status: Never Smoker  . Smokeless tobacco: Never Used  . Alcohol use No    Family History Family History  Problem Relation Age of Onset  . Cancer Mother   . Cancer Sister   . Breast cancer Neg Hx     Allergies  Allergen Reactions  . Sulfa Antibiotics Rash   Denies any known family hx of venous insufficieny or varicose veins  REVIEW OF SYSTEMS (Negative unless checked)  Constitutional: [] Weight loss  [] Fever  [] Chills Cardiac: [] Chest pain   [] Chest pressure   [] Palpitations   [] Shortness of breath when laying flat   [] Shortness of breath with exertion. Vascular:  [] Pain in legs with walking   [] Pain in legs at rest  [] History of DVT   []   Phlebitis   Swelling in legs   Varicose veins   Non-healing ulcers Pulmonary:   Uses home oxygen   Productive cough   Hemoptysis   Wheeze  COPD   Asthma Neurologic:  Dizziness   Seizures   History of stroke   History of TIA  Aphasia   Facial droop  Visual changes   Weakness or numbness in arm   Weakness or numbness in leg Musculoskeletal:   Joint swelling   Joint pain   Low back pain Hematologic:  Easy bruising  Easy bleeding   Hypercoagulable state   Anemic Gastrointestinal:  Diarrhea   Vomiting   Abdominal  pain  Difficulty swallowing.   Blood in stool Genitourinary:  Chronic kidney disease   Difficult urination  Frequent urination   Blood in urine Skin:  Rashes   Ulcers  Psychological:  History of anxiety    History of major depression.  Physical Examination  Vitals:   03/25/17 1145  BP: (!) 158/89  Pulse: 60  Resp: 16  Weight: 146 lb (66.2 kg)   Body mass index is 23.57 kg/m. Gen: WD/WN, NAD Head: [place]/AT, No temporalis wasting.  Ear/Nose/Throat: Hearing grossly intact, nares w/o erythema or drainage Eyes: PER, EOMI, sclera nonicteric.  Pulmonary:  Good air movement, no use of accessory muscles.  Cardiac: RRR, no JVD Vascular:  Vessel Right Left  Radial +2 Palpable +2 Palpable  Ulnar    Brachial    Carotid    Femoral    Popliteal    PT +2 Palpable +2 Palpable  DP +2 Palpable +2 Palpable  +1 lower extremity swelling noted bilaterally  Neurologic: Alert and oriented x 3. Facial movements symmetrical.  Speech is fluent.  Psychiatric: Judgment intact, Mood & affect appropriate for pt's clinical situation. Dermatologic: No rashes or ulcers noted.  No changes consistent with cellulitis or stasis dermatitis. Lymph : No lichenification or skin changes of chronic lymphedema.  CBC Lab Results  Component Value Date   WBC 4.3 11/27/2016   HGB 11.4 (L) 11/27/2016   HCT 36.2 11/27/2016   MCV 87.2 11/27/2016   PLT 227 11/27/2016    BMET    Component Value Date/Time   NA 144 11/27/2016 1146   NA 143 11/26/2015 1218   K 4.5 11/27/2016 1146   K 4.1 04/20/2014 1319   CL 108 11/27/2016 1146   CO2 24 11/27/2016 1146   GLUCOSE 98 11/27/2016 1146   BUN 24 11/27/2016 1146   BUN 36 (H) 11/26/2015 1218   CREATININE 1.31 (H) 11/27/2016 1146   CALCIUM 9.7 11/27/2016 1146   GFRNONAA 39 (L) 11/27/2016 1146   GFRAA 45 (L) 11/27/2016 1146   CrCl cannot be calculated (Patient's most recent lab result is older than the maximum 21 days allowed.).  COAG No results  found for: INR, PROTIME  Radiology  Venous reflux study can be found under media tab for the date of 03/25/17. This study has been reviewed by both myself and Dr. Gilda Crease.    Assessment/Plan 1. Chronic venous insufficiency  No surgery or intervention at this point in time.  I have had an extensive discussion with the patient regarding venous insufficiency and why it causes symptoms. I have discussed with the patient the chronic skin changes that accompany venous insufficiency and the long term sequel,  such as ulceration, that may occur if she develops swelling and does not address it. Patient will contnue wearing graduated compression stockings on a daily basis, as this has provided excellent control  of her edema and pain. The patient will put the stockings on first thing in the morning and remove them in the evening. The patient is reminded not to sleep in the stockings.  In addition, behavioral modification including elevation during the day will be initiated.  Exercise is strongly encouraged.  Duplex ultrasound of bilateral lower extremity shows venous incompetence to bilateral deep venous systems and bilateral great saphenous veins. No DVT/superficial thrombus noted.   Given the patient's good symptom control and lack of any problems regarding the venous insufficiency and lymphedema at this time a lymph pump is not needed at this time. I have also discussed the possibility of laser ablation and subsequent sclerotherapy for her symptoms. I reviewed the process of each procedure and the risks and benefits of each. The patient verbalizes understanding. She states that at this time she would prefer to continue with conservative therapy and will f/u PRN if she develops any new/worsening symptoms.   2. Varicose veins of both lower extremities with inflammation   See above   3. Primary osteoarthritis of both knees, right leg worse  Patient states that in the past when she had swelling it was  localized more to the right leg and she attributed it to arthritis. The swelling has resolved at this time. She being followed by Dr. Ernest Pine for this and is taking Tylenol 650 mg. Continue to f/u with ortho for further management.   -Red flags and when to present for emergency care or RTC including fever >101.63F, chest pain, shortness of breath, new/worsening/un-resolving symptoms, S/S of cellulitis, or development of stasis dermatitis reviewed with patient at time of visit. Follow up and care instructions discussed and patient verbalized understanding.  I have discussed this patient with Dr. Gilda Crease who agrees with this plan of care.    Clarene Duke, NP  03/25/2017 1:13 PM

## 2017-03-25 NOTE — Patient Instructions (Signed)

## 2017-03-30 ENCOUNTER — Ambulatory Visit: Payer: Self-pay | Admitting: Family Medicine

## 2017-04-07 ENCOUNTER — Other Ambulatory Visit: Payer: Self-pay | Admitting: Family Medicine

## 2017-04-07 DIAGNOSIS — Z1231 Encounter for screening mammogram for malignant neoplasm of breast: Secondary | ICD-10-CM

## 2017-04-08 ENCOUNTER — Ambulatory Visit (INDEPENDENT_AMBULATORY_CARE_PROVIDER_SITE_OTHER): Payer: Medicare Other | Admitting: Family Medicine

## 2017-04-08 ENCOUNTER — Encounter: Payer: Self-pay | Admitting: Family Medicine

## 2017-04-08 VITALS — BP 134/76 | HR 94 | Temp 98.5°F | Resp 16 | Ht 66.0 in | Wt 145.6 lb

## 2017-04-08 DIAGNOSIS — I10 Essential (primary) hypertension: Secondary | ICD-10-CM | POA: Diagnosis not present

## 2017-04-08 DIAGNOSIS — E1122 Type 2 diabetes mellitus with diabetic chronic kidney disease: Secondary | ICD-10-CM | POA: Diagnosis not present

## 2017-04-08 DIAGNOSIS — Z23 Encounter for immunization: Secondary | ICD-10-CM | POA: Diagnosis not present

## 2017-04-08 DIAGNOSIS — I872 Venous insufficiency (chronic) (peripheral): Secondary | ICD-10-CM

## 2017-04-08 DIAGNOSIS — I8312 Varicose veins of left lower extremity with inflammation: Secondary | ICD-10-CM | POA: Diagnosis not present

## 2017-04-08 DIAGNOSIS — I8311 Varicose veins of right lower extremity with inflammation: Secondary | ICD-10-CM

## 2017-04-08 DIAGNOSIS — N3941 Urge incontinence: Secondary | ICD-10-CM | POA: Diagnosis not present

## 2017-04-08 DIAGNOSIS — F3340 Major depressive disorder, recurrent, in remission, unspecified: Secondary | ICD-10-CM | POA: Diagnosis not present

## 2017-04-08 DIAGNOSIS — E785 Hyperlipidemia, unspecified: Secondary | ICD-10-CM | POA: Diagnosis not present

## 2017-04-08 DIAGNOSIS — N183 Chronic kidney disease, stage 3 (moderate): Secondary | ICD-10-CM

## 2017-04-08 LAB — POCT GLYCOSYLATED HEMOGLOBIN (HGB A1C): Hemoglobin A1C: 6

## 2017-04-08 MED ORDER — SOLIFENACIN SUCCINATE 10 MG PO TABS: 10.0000 mg | ORAL_TABLET | Freq: Every day | ORAL | 0 refills | Status: DC

## 2017-04-08 NOTE — Progress Notes (Signed)
Name: Melanie Gray   MRN: [id number]    DOB: [birth year]   Date:04/08/2017       Progress Note  Subjective  Chief Complaint  Chief Complaint  Patient presents with  . Medication Refill    4 month F/U  . Diabetes    Checks every Saturday Average-106 Highest-126  . Hypertension    Denies any symptoms  . Hyperlipidemia  . Varicose Veins    Went to vascular doctors and was told to start wearing compression hoses and doing well.    HPI  HTN: she is now only Losartan and bp is at goal. BP at home has been around 120's-130's/70's-80's. No chest pain, palpitation or SOB.   Diabetes with renal manifestation: GFR stable, on ARB, urine micro is back to normal according to labs in May 2018,  she denies polyphagia, polydipsia.  She endorses polyuria  She follows a diabetic diet for the most part - avoids white foods; has dessert sometimes. Checks BG once a week - Lowest is 95, highest is 126.  She denies hypoglycemic episodes.  Has not been taking any medications for DM - A1C today is 6.0 which is up from 5.5% in May 2018. On Statin and ARB, taking Aspirin daily.   Hyperlipidemia: taking Pravastatin, denies myalgia, compliant with medication.   Major Depression: She denies being depressed, she states she is an introvert, she isolates herself on purpose. She notes that her husband of 55 years passing away 5 years ago had a big impact on her mood. No longer going to church. She has been socializing a bit more lately - visiting with family nd talking on the phone with her children sometimes.  Explained importance of having regular visits with loved ones. She states she feels fine, she does not feel depressed  Varicose veins: She saw vein and vascular on 03/25/2017. She was told to wear compression stockings and they have helped her pain significantly. If her pain returns at all, she was told to call back to schedule a follow up visit.  Urge incontinence: she states not going to church because has to  void during the service, we will check urine culture today to ensure no infection.  She gets up often in the night to urinate.  She reports that when she has the urge to urinate she cannot wait a single moment.  No abdominal pain or decreased appetite, no dysuria.  Patient Active Problem List   Diagnosis Date Noted  . Varicose veins of both lower extremities with inflammation 12/01/2016  . Chronic venous insufficiency 12/01/2016  . Pain in limb 12/01/2016  . Benign hypertension 11/06/2014  . Chronic anemia 11/06/2014  . Seborrheic dermatitis 11/06/2014  . Diabetes mellitus with renal manifestation (HCC) 11/06/2014  . Dyslipidemia 11/06/2014  . Cephalalgia 11/06/2014  . Deafness, sensorineural 11/06/2014  . Liver regeneration 11/06/2014  . Arthritis of knee, degenerative 11/06/2014  . Panic attack 11/06/2014  . Restless legs syndrome 11/06/2014  . Allergic rhinitis 11/06/2014  . Spinal stenosis 11/06/2014  . Osteopenia 05/09/2009  . Chronic Gray disease (CKD), stage III (moderate) 04/20/2008    Past Surgical History:  Procedure Laterality Date  . ABDOMINAL HYSTERECTOMY    . CHOLECYSTECTOMY    . HERNIA REPAIR      Family History  Problem Relation Age of Onset  . Cancer Mother   . Cancer Sister   . Breast cancer Neg Hx     Social History   Social History  . Marital status: Widowed  Spouse name: N/A  . Number of children: N/A  . Years of education: N/A   Occupational History  . unemployed    Social History Main Topics  . Smoking status: Never Smoker  . Smokeless tobacco: Never Used  . Alcohol use No  . Drug use: No  . Sexual activity: No   Other Topics Concern  . Not on file   Social History Narrative  . No narrative on file     Current Outpatient Prescriptions:  .  aspirin EC 81 MG tablet, Take 81 mg by mouth daily., Disp: , Rfl:  .  Cholecalciferol (VITAMIN D) 2000 UNITS tablet, Take 1 tablet by mouth daily., Disp: , Rfl:  .   Glucosamine-Chondroitin 250-200 MG TABS, Take 1 tablet by mouth daily., Disp: , Rfl:  .  GLUCOSE BLOOD VI, ONETOUCH ULTRA BLUE (In Vitro Strip)  use as directed for 0 days  Quantity: 100.00;  Refills: 0   Ordered :25-Jul-2010  Alba Cory MD;  Mora Appl 21-December-2008 Active Comments: DX: 790.29, Disp: , Rfl:  .  losartan (COZAAR) 50 MG tablet, Take 1 tablet (50 mg total) by mouth daily. In place of Valsartan, Disp: 90 tablet, Rfl: 0 .  Multiple Vitamin (MULTI-VITAMINS) TABS, Take 1 tablet by mouth daily., Disp: , Rfl:  .  pravastatin (PRAVACHOL) 40 MG tablet, TAKE 1 TABLET (40 MG TOTAL) BY MOUTH DAILY., Disp: 90 tablet, Rfl: 1  Allergies  Allergen Reactions  . Sulfa Antibiotics Rash     ROS  Constitutional: Negative for fever or weight change.  Respiratory: Negative for cough and shortness of breath.   Cardiovascular: Negative for chest pain or palpitations.  Gastrointestinal: Negative for abdominal pain, no bowel changes.  Musculoskeletal: Negative for gait problem or joint swelling.  Skin: Negative for rash.  Neurological: Negative for dizziness or headache.  No other specific complaints in a complete review of systems (except as listed in HPI above).  Objective  Vitals:   04/08/17 1009  BP: 134/76  Pulse: 94  Resp: 16  Temp: 98.5 F (36.9 C)  TempSrc: Oral  SpO2: 98%  Weight: 145 lb 9.6 oz (66 kg)  Height:  (1.676 m)    Body mass index is 23.5 kg/m.  Physical Exam Constitutional: Patient appears well-developed and well-nourished. No distress.  HENT: Head: Normocephalic and atraumatic.  Eyes: Conjunctivae and EOM are normal. Pupils are equal, round, and reactive to light. No scleral icterus.  Neck: Normal range of motion. Neck supple. No JVD present. No thyromegaly present.  Cardiovascular: Normal rate, regular rhythm and normal heart sounds.  No murmur heard. Trace BLE edema.  Varicose veins present  on BLE. Pulmonary/Chest: Effort normal and breath sounds  normal. No respiratory distress.s Skin: Skin is warm and dry. No rash noted. No erythema.  Psychiatric: Patient has a normal mood and affect. behavior is normal. Judgment and thought content normal.  No results found for this or any previous visit (from the past 2160 hour(s)).  PHQ2/9: Depression screen [facility] 2/9 04/08/2017 11/27/2016 07/28/2016 03/28/2016 11/26/2015  Decreased Interest 0 0 0 0 0  Down, Depressed, Hopeless 0 0 0 0 0  PHQ - 2 Score 0 0 0 0 0   Fall Risk: Fall Risk  04/08/2017 11/27/2016 07/28/2016 03/28/2016 11/26/2015  Falls in the past year? No Yes Yes No No  Number falls in past yr: - 1 1 - -  Comment - Christmas Eve - - -  Injury with Fall? - Yes Yes - -  Comment -  Broken some ribs Fall in [place] on Dec. 24 and fracture Ribs  - -  Risk for fall due to : - - - - -   Functional Status Survey: Is the patient deaf or have difficulty hearing?: No Does the patient have difficulty seeing, even when wearing glasses/contacts?: No Does the patient have difficulty concentrating, remembering, or making decisions?: No Does the patient have difficulty walking or climbing stairs?: Yes Does the patient have difficulty dressing or bathing?: No Does the patient have difficulty doing errands alone such as visiting a doctor's office or shopping?: No  Assessment & Plan  1. Type 2 diabetes mellitus with stage 3 chronic Gray disease, without long-term current use of insulin (HCC) - POCT HgB A1C - Discussed diabetic diet; stable A1C at this time. - Needs eye exam - has upcoming appointment.  2. Needs flu shot - Flu vaccine HIGH DOSE PF (Fluzone High dose)  3. Benign hypertension Continue Valsartan as prescribed  4. Chronic venous insufficiency Compression stockings as prescribed  5. Varicose veins of both lower extremities with inflammation Compression stockings as prescribed  6. Urge incontinence of urine - solifenacin (VESICARE) 10 MG tablet; Take 1 tablet (10 mg total) by mouth  daily.  Dispense: 90 tablet; Refill: 0 - Urine Culture  7. Dyslipidemia Continue Prevastatin  8. Depression, major, recurrent, in remission (HCC) In remission, stable.

## 2017-04-09 LAB — URINE CULTURE
MICRO NUMBER:: 81066824
Result:: NO GROWTH
SPECIMEN QUALITY:: ADEQUATE

## 2017-04-21 ENCOUNTER — Telehealth: Payer: Self-pay | Admitting: Family Medicine

## 2017-04-21 NOTE — Telephone Encounter (Signed)
She can stop medication and monitor symptoms of bladder incontinence

## 2017-04-21 NOTE — Telephone Encounter (Signed)
PT SAID THAT EMILY HAD CALLED IN VESICARE FOR HER TO [facility] AND SHE DOES NOT HAVE INSURANCE AND THE PHARMACY TOLD HER THAT FOR THAT RX IT WAS GOING TO BE 1000.00 AND SHE SAID THAT SHE WAS NOT ABLE TO GET IT AND IS NOT GOING TO BE TAKING IT FOR SHE HAS NO INSURACNE. PLEASE ADVISE PT ON WHAT SHE NEEDS TO DO.

## 2017-04-21 NOTE — Telephone Encounter (Signed)
Is there any otc medications she can take for her symptoms? She was unable to afford Vesicare and never started.

## 2017-04-22 ENCOUNTER — Other Ambulatory Visit: Payer: Self-pay | Admitting: Family Medicine

## 2017-04-22 MED ORDER — OXYBUTYNIN CHLORIDE ER 5 MG PO TB24: 5.0000 mg | ORAL_TABLET | Freq: Every day | ORAL | 0 refills | Status: DC

## 2017-04-22 NOTE — Telephone Encounter (Signed)
I will send rx of Oxybutin, generic medication, it can cause dry mouth, constipation and not indicated with patients with glaucoma. It will be in place of Vesicare

## 2017-04-27 ENCOUNTER — Ambulatory Visit
Admission: RE | Admit: 2017-04-27 | Discharge: 2017-04-27 | Disposition: A | Discharge status: Home / Self Care | Payer: Medicare Other | Source: Ambulatory Visit | Attending: Family Medicine | Admitting: Family Medicine

## 2017-04-27 DIAGNOSIS — Z1231 Encounter for screening mammogram for malignant neoplasm of breast: Secondary | ICD-10-CM

## 2017-05-18 DIAGNOSIS — H2513 Age-related nuclear cataract, bilateral: Secondary | ICD-10-CM | POA: Diagnosis not present

## 2017-05-25 ENCOUNTER — Other Ambulatory Visit: Payer: Self-pay | Admitting: Family Medicine

## 2017-05-25 NOTE — Telephone Encounter (Signed)
Refill request for Hypertension medication:  Losartan  Last office visit pertaining to hypertension: 09/216/2018  BP Readings from Last 3 Encounters:  04/08/17 134/76  03/25/17 (!) 158/89  12/01/16 (!) 152/86     Lab Results  Component Value Date   CREATININE 1.31 (H) 11/27/2016   BUN 24 11/27/2016   NA 144 11/27/2016   K 4.5 11/27/2016   CL 108 11/27/2016   CO2 24 11/27/2016   Follow up appt: 08/10/2017

## 2017-06-10 DIAGNOSIS — H2512 Age-related nuclear cataract, left eye: Secondary | ICD-10-CM | POA: Diagnosis not present

## 2017-07-13 ENCOUNTER — Encounter: Payer: Self-pay | Admitting: *Deleted

## 2017-07-16 ENCOUNTER — Ambulatory Visit: Payer: Medicare Other | Admitting: Anesthesiology

## 2017-07-16 ENCOUNTER — Encounter: Payer: Self-pay | Admitting: *Deleted

## 2017-07-16 ENCOUNTER — Ambulatory Visit
Admission: RE | Admit: 2017-07-16 | Discharge: 2017-07-16 | Disposition: A | Discharge status: Home / Self Care | Payer: Medicare Other | Source: Ambulatory Visit | Attending: Ophthalmology | Admitting: Ophthalmology

## 2017-07-16 ENCOUNTER — Encounter
Admission: RE | Disposition: A | Discharge status: Home / Self Care | Payer: Self-pay | Source: Ambulatory Visit | Attending: Ophthalmology

## 2017-07-16 ENCOUNTER — Other Ambulatory Visit: Payer: Self-pay

## 2017-07-16 DIAGNOSIS — Z79899 Other long term (current) drug therapy: Secondary | ICD-10-CM | POA: Diagnosis not present

## 2017-07-16 DIAGNOSIS — H2512 Age-related nuclear cataract, left eye: Secondary | ICD-10-CM | POA: Diagnosis not present

## 2017-07-16 DIAGNOSIS — E1122 Type 2 diabetes mellitus with diabetic chronic kidney disease: Secondary | ICD-10-CM | POA: Insufficient documentation

## 2017-07-16 DIAGNOSIS — Z882 Allergy status to sulfonamides status: Secondary | ICD-10-CM | POA: Diagnosis not present

## 2017-07-16 DIAGNOSIS — Z7982 Long term (current) use of aspirin: Secondary | ICD-10-CM | POA: Diagnosis not present

## 2017-07-16 DIAGNOSIS — M810 Age-related osteoporosis without current pathological fracture: Secondary | ICD-10-CM | POA: Insufficient documentation

## 2017-07-16 DIAGNOSIS — I10 Essential (primary) hypertension: Secondary | ICD-10-CM | POA: Diagnosis not present

## 2017-07-16 DIAGNOSIS — E119 Type 2 diabetes mellitus without complications: Secondary | ICD-10-CM | POA: Diagnosis not present

## 2017-07-16 DIAGNOSIS — F419 Anxiety disorder, unspecified: Secondary | ICD-10-CM | POA: Diagnosis not present

## 2017-07-16 DIAGNOSIS — M1990 Unspecified osteoarthritis, unspecified site: Secondary | ICD-10-CM | POA: Insufficient documentation

## 2017-07-16 DIAGNOSIS — I129 Hypertensive chronic kidney disease with stage 1 through stage 4 chronic kidney disease, or unspecified chronic kidney disease: Secondary | ICD-10-CM | POA: Insufficient documentation

## 2017-07-16 DIAGNOSIS — N189 Chronic kidney disease, unspecified: Secondary | ICD-10-CM | POA: Diagnosis not present

## 2017-07-16 DIAGNOSIS — E7800 Pure hypercholesterolemia, unspecified: Secondary | ICD-10-CM | POA: Insufficient documentation

## 2017-07-16 HISTORY — DX: Chronic kidney disease, unspecified: N18.9

## 2017-07-16 HISTORY — DX: Nausea with vomiting, unspecified: Z98.890

## 2017-07-16 HISTORY — DX: Adverse effect of unspecified anesthetic, initial encounter: T41.45XA

## 2017-07-16 HISTORY — PX: CATARACT EXTRACTION W/PHACO: SHX586

## 2017-07-16 HISTORY — DX: Dizziness and giddiness: R42

## 2017-07-16 HISTORY — DX: Other instability, unspecified knee: M25.369

## 2017-07-16 HISTORY — DX: Other complications of anesthesia, initial encounter: T88.59XA

## 2017-07-16 HISTORY — DX: Nausea with vomiting, unspecified: R11.2

## 2017-07-16 LAB — GLUCOSE, CAPILLARY: GLUCOSE-CAPILLARY: 89 mg/dL (ref 65–99)

## 2017-07-16 SURGERY — PHACOEMULSIFICATION, CATARACT, WITH IOL INSERTION
Anesthesia: Monitor Anesthesia Care | Site: Eye | Laterality: Left | Wound class: Clean

## 2017-07-16 MED ORDER — GLYCOPYRROLATE 0.2 MG/ML IJ SOLN
INTRAMUSCULAR | Status: AC
  Filled 2017-07-16: qty 1

## 2017-07-16 MED ORDER — LIDOCAINE HCL (PF) 4 % IJ SOLN
INTRAMUSCULAR | Status: AC
  Filled 2017-07-16: qty 5

## 2017-07-16 MED ORDER — GLYCOPYRROLATE 0.2 MG/ML IJ SOLN
INTRAMUSCULAR | Status: DC | PRN
  Administered 2017-07-16: 0.2 mg via INTRAVENOUS

## 2017-07-16 MED ORDER — MOXIFLOXACIN HCL 0.5 % OP SOLN
OPHTHALMIC | Status: DC | PRN
  Administered 2017-07-16: 0.2 mL via OPHTHALMIC

## 2017-07-16 MED ORDER — MIDAZOLAM HCL 2 MG/2ML IJ SOLN
INTRAMUSCULAR | Status: DC | PRN
  Administered 2017-07-16: 1 mg via INTRAVENOUS

## 2017-07-16 MED ORDER — MIDAZOLAM HCL 2 MG/2ML IJ SOLN
INTRAMUSCULAR | Status: AC
  Filled 2017-07-16: qty 2

## 2017-07-16 MED ORDER — EPINEPHRINE PF 1 MG/ML IJ SOLN
INTRAOCULAR | Status: DC | PRN
  Administered 2017-07-16: 08:00:00 via OPHTHALMIC

## 2017-07-16 MED ORDER — POVIDONE-IODINE 5 % OP SOLN
OPHTHALMIC | Status: AC
  Filled 2017-07-16: qty 30

## 2017-07-16 MED ORDER — SODIUM HYALURONATE 23 MG/ML IO SOLN
INTRAOCULAR | Status: AC
  Filled 2017-07-16: qty 0.6

## 2017-07-16 MED ORDER — SODIUM HYALURONATE 10 MG/ML IO SOLN
INTRAOCULAR | Status: DC | PRN
  Administered 2017-07-16: 0.55 mL via INTRAOCULAR

## 2017-07-16 MED ORDER — ONDANSETRON HCL 4 MG/2ML IJ SOLN
INTRAMUSCULAR | Status: AC
  Filled 2017-07-16: qty 2

## 2017-07-16 MED ORDER — ARMC OPHTHALMIC DILATING DROPS
OPHTHALMIC | Status: AC
  Filled 2017-07-16: qty 0.4

## 2017-07-16 MED ORDER — CARBACHOL 0.01 % IO SOLN
INTRAOCULAR | Status: DC | PRN
  Administered 2017-07-16: 0.5 mL via INTRAOCULAR

## 2017-07-16 MED ORDER — ONDANSETRON HCL 4 MG/2ML IJ SOLN
INTRAMUSCULAR | Status: DC | PRN
  Administered 2017-07-16: 4 mg via INTRAVENOUS

## 2017-07-16 MED ORDER — FENTANYL CITRATE (PF) 100 MCG/2ML IJ SOLN
INTRAMUSCULAR | Status: DC | PRN
  Administered 2017-07-16: 50 ug via INTRAVENOUS

## 2017-07-16 MED ORDER — FENTANYL CITRATE (PF) 100 MCG/2ML IJ SOLN
INTRAMUSCULAR | Status: AC
  Filled 2017-07-16: qty 2

## 2017-07-16 MED ORDER — MOXIFLOXACIN HCL 0.5 % OP SOLN: 1.0000 [drp] | OPHTHALMIC | Status: DC | PRN

## 2017-07-16 MED ORDER — EPINEPHRINE PF 1 MG/ML IJ SOLN
INTRAMUSCULAR | Status: AC
  Filled 2017-07-16: qty 2

## 2017-07-16 MED ORDER — ARMC OPHTHALMIC DILATING DROPS
1.0000 | OPHTHALMIC | Status: AC
Start: 2017-07-16 — End: 2017-07-16
  Administered 2017-07-16: 1 via OPHTHALMIC
  Administered 2017-07-16: 07:00:00 via OPHTHALMIC

## 2017-07-16 MED ORDER — POVIDONE-IODINE 5 % OP SOLN
OPHTHALMIC | Status: DC | PRN
  Administered 2017-07-16: 1 via OPHTHALMIC

## 2017-07-16 MED ORDER — SODIUM CHLORIDE 0.9 % IV SOLN
INTRAVENOUS | Status: DC
  Administered 2017-07-16: 06:00:00 via INTRAVENOUS

## 2017-07-16 MED ORDER — MOXIFLOXACIN HCL 0.5 % OP SOLN
OPHTHALMIC | Status: AC
  Filled 2017-07-16: qty 3

## 2017-07-16 MED ORDER — BSS IO SOLN
INTRAOCULAR | Status: DC | PRN
  Administered 2017-07-16: 4 mL via OPHTHALMIC

## 2017-07-16 MED ORDER — SODIUM HYALURONATE 23 MG/ML IO SOLN
INTRAOCULAR | Status: DC | PRN
  Administered 2017-07-16: 0.6 mL via INTRAOCULAR

## 2017-07-16 SURGICAL SUPPLY — 16 items
DISSECTOR HYDRO NUCLEUS 50X22 (MISCELLANEOUS) ×3 IMPLANT
GLOVE BIO SURGEON STRL SZ8 (GLOVE) ×3 IMPLANT
GLOVE BIOGEL M 6.5 STRL (GLOVE) ×3 IMPLANT
GLOVE SURG LX 7.5 STRW (GLOVE) ×2
GLOVE SURG LX STRL 7.5 STRW (GLOVE) ×1 IMPLANT
GOWN STRL REUS W/ TWL LRG LVL3 (GOWN DISPOSABLE) ×2 IMPLANT
GOWN STRL REUS W/TWL LRG LVL3 (GOWN DISPOSABLE) ×4
LABEL CATARACT MEDS ST (LABEL) ×3 IMPLANT
LENS IOL TECNIS ITEC 21.5 (Intraocular Lens) ×3 IMPLANT
PACK CATARACT (MISCELLANEOUS) ×3 IMPLANT
PACK CATARACT KING (MISCELLANEOUS) ×3 IMPLANT
PACK EYE AFTER SURG (MISCELLANEOUS) ×3 IMPLANT
SOL BSS BAG (MISCELLANEOUS) ×3
SOLUTION BSS BAG (MISCELLANEOUS) ×1 IMPLANT
WATER STERILE IRR 250ML POUR (IV SOLUTION) ×3 IMPLANT
WIPE NON LINTING 3.25X3.25 (MISCELLANEOUS) ×3 IMPLANT

## 2017-07-16 NOTE — Anesthesia Preprocedure Evaluation (Signed)
Anesthesia Evaluation  Patient identified by MRN, date of birth, ID band Patient awake    Reviewed: Allergy & Precautions, NPO status , Patient's Chart, lab work & pertinent test results  History of Anesthesia Complications (+) PONV and history of anesthetic complications  Airway Mallampati: III  TM Distance: >3 FB Neck ROM: Full    Dental  (+) Caps   Pulmonary neg pulmonary ROS, neg sleep apnea, neg COPD,    breath sounds clear to auscultation- rhonchi (-) wheezing      Cardiovascular hypertension, Pt. on medications (-) CAD, (-) Past MI, (-) Cardiac Stents and (-) CABG  Rhythm:Regular Rate:Normal - Systolic murmurs and - Diastolic murmurs    Neuro/Psych  Headaches, Anxiety    GI/Hepatic negative GI ROS, Neg liver ROS,   Endo/Other  diabetes (diet controlled)  Renal/GU Renal InsufficiencyRenal disease     Musculoskeletal  (+) Arthritis ,   Abdominal (+) - obese,   Peds  Hematology  (+) anemia ,   Anesthesia Other Findings Past Medical History: No date: Chronic kidney disease     Comment:  RENAL INSUFF No date: Complication of anesthesia     Comment:  HA , N/V No date: Diabetes mellitus without complication (HCC) No date: Hyperlipidemia No date: Hypertension No date: Knee instability     Comment:  RIGHT KNEE WEAKNESS No date: PONV (postoperative nausea and vomiting) No date: Vertigo   Reproductive/Obstetrics                             Anesthesia Physical Anesthesia Plan  ASA: III  Anesthesia Plan: MAC   Post-op Pain Management:    Induction: Intravenous  PONV Risk Score and Plan: 3 and Midazolam  Airway Management Planned: Natural Airway  Additional Equipment:   Intra-op Plan:   Post-operative Plan:   Informed Consent: I have reviewed the patients History and Physical, chart, labs and discussed the procedure including the risks, benefits and alternatives for the  proposed anesthesia with the patient or authorized representative who has indicated his/her understanding and acceptance.     Plan Discussed with: CRNA and Anesthesiologist  Anesthesia Plan Comments:         Anesthesia Quick Evaluation

## 2017-07-16 NOTE — Anesthesia Postprocedure Evaluation (Signed)
Anesthesia Post Note  Patient: Jezelle Gullick  Procedure(s) Performed: CATARACT EXTRACTION PHACO AND INTRAOCULAR LENS PLACEMENT ([place]) (Left Eye)  Patient location during evaluation: PACU Anesthesia Type: MAC Level of consciousness: awake and alert Pain management: pain level controlled Vital Signs Assessment: post-procedure vital signs reviewed and stable Respiratory status: spontaneous breathing, nonlabored ventilation, respiratory function stable and patient connected to nasal cannula oxygen Cardiovascular status: stable and blood pressure returned to baseline Postop Assessment: no apparent nausea or vomiting Anesthetic complications: no     Last Vitals:  Vitals:   07/16/17 0807 07/16/17 0809  BP: 138/77 138/77  Pulse:  88  Resp: 12 16  Temp: 36.6 C 36.6 C  SpO2: 100% 100%    Last Pain:  Vitals:   07/16/17 6734  TempSrc: Oral                 Alison Stalling

## 2017-07-16 NOTE — Anesthesia Post-op Follow-up Note (Signed)
Anesthesia QCDR form completed.        

## 2017-07-16 NOTE — Anesthesia Procedure Notes (Signed)
Date/Time: 07/16/2017 7:35 AM Performed by: Stormy Fabianurtis, Daiveon Markman, CRNA Pre-anesthesia Checklist: Patient identified, Emergency Drugs available, Suction available and Patient being monitored Patient Re-evaluated:Patient Re-evaluated prior to induction Oxygen Delivery Method: Nasal cannula Induction Type: IV induction Dental Injury: Teeth and Oropharynx as per pre-operative assessment  Comments: Nasal cannula with etCO2 monitoring

## 2017-07-16 NOTE — Transfer of Care (Signed)
Immediate Anesthesia Transfer of Care Note  Patient: Laurel Harnden  Procedure(s) Performed: Procedure(s) with comments: CATARACT EXTRACTION PHACO AND INTRAOCULAR LENS PLACEMENT ([facility]) (Left) - [place] 01:27.0 AP% 21.0 CDE 18.28 Fluid Pack Lot # [id number]  Patient Location: PHASE II  Anesthesia Type:MAC  Level of Consciousness: Awake, Alert, Oriented  Airway & Oxygen Therapy: Patient Spontanous Breathing and Patient on room air   Post-op Assessment: Report given to RN and Post -op Vital signs reviewed and stable  Post vital signs: Reviewed and stable  Last Vitals:  Vitals:   07/16/17 0807 07/16/17 0809  BP: 138/77 138/77  Pulse:  88  Resp: 12 16  Temp: 36.6 C 36.6 C  SpO2: 048% 889%    Complications: No apparent anesthesia complications

## 2017-07-16 NOTE — H&P (Signed)
The History and Physical notes are on paper, have been signed, and are to be scanned.   I have examined the patient and there are no changes to the H&P.   Willey BladeBradley Kriston Mckinnie 07/16/2017 7:08 AM

## 2017-07-16 NOTE — Op Note (Signed)
OPERATIVE NOTE  Melanie CourserKay Gray [id number] 07/16/2017   PREOPERATIVE DIAGNOSIS:  Nuclear sclerotic cataract left eye.  H25.12   POSTOPERATIVE DIAGNOSIS:    Nuclear sclerotic cataract left eye.     PROCEDURE:  Phacoemusification with posterior chamber intraocular lens placement of the left eye   LENS:   Implant Name Type Inv. Item Serial No. Manufacturer Lot No. LRB No. Used  LENS IOL DIOP 21.5 - [id number] Intraocular Lens LENS IOL DIOP 21.5 [id number] AMO  Left 1       PCB00 +21.5   ULTRASOUND TIME: 1 minutes 27 seconds.  CDE 18.28   SURGEON:  Willey BladeBradley Johnel Yielding, MD, MPH   ANESTHESIA:  Topical with tetracaine drops augmented with 1% preservative-free intracameral lidocaine.  ESTIMATED BLOOD LOSS: <1 mL   COMPLICATIONS:  None.   DESCRIPTION OF PROCEDURE:  The patient was identified in the holding room and transported to the operating room and placed in the supine position under the operating microscope.  The left eye was identified as the operative eye and it was prepped and draped in the usual sterile ophthalmic fashion.   A 1.0 millimeter clear-corneal paracentesis was made at the 5:00 position. 0.5 ml of preservative-free 1% lidocaine with epinephrine was injected into the anterior chamber.  The anterior chamber was filled with Healon 5 viscoelastic.  A 2.4 millimeter keratome was used to make a near-clear corneal incision at the 2:00 position.  A curvilinear capsulorrhexis was made with a cystotome and capsulorrhexis forceps.  Balanced salt solution was used to hydrodissect and hydrodelineate the nucleus.   Phacoemulsification was then used in stop and chop fashion to remove the lens nucleus and epinucleus.  The lens was very dense and required significant phaco energy, CDE of 12 for the initial sculpt.  The remaining cortex was then removed using the irrigation and aspiration handpiece. Healon was then placed into the capsular bag to distend it for lens placement.  A lens was then  injected into the capsular bag.  The remaining viscoelastic was aspirated.   Wounds were hydrated with balanced salt solution.  The anterior chamber was inflated to a physiologic pressure with balanced salt solution.  Intracameral vigamox 0.1 mL undiltued was injected into the eye and a drop placed onto the ocular surface.  No wound leaks were noted.  The patient was taken to the recovery room in stable condition without complications of anesthesia or surgery  Willey BladeBradley Allexa Acoff 07/16/2017, 8:05 AM

## 2017-07-16 NOTE — Discharge Instructions (Signed)
Eye Surgery Discharge Instructions ° °Expect mild scratchy sensation or mild soreness. °DO NOT RUB YOUR EYE! ° °The day of surgery: °• Minimal physical activity, but bed rest is not required °• No reading, computer work, or close hand work °• No bending, lifting, or straining. °• May watch TV ° °For 24 hours: °• No driving, legal decisions, or alcoholic beverages °• Safety precautions °• Eat anything you prefer: It is better to start with liquids, then soup then solid foods. °• _____ Eye patch should be worn until postoperative exam tomorrow. °• ____ Solar shield eyeglasses should be worn for comfort in the sunlight/patch while sleeping ° °Resume all regular medications including aspirin or Coumadin if these were discontinued prior to surgery. °You may shower, bathe, shave, or wash your hair. °Tylenol may be taken for mild discomfort. ° °Call your doctor if you experience significant pain, nausea, or vomiting, fever > 101 or other signs of infection. [phone] or [phone] °Specific instructions: ° °Follow-up Information   ° King, Bradley Mark, MD Follow up on 07/17/2017.   °Specialty:  Ophthalmology °Why:  as scheduled °Contact information: °[address] °[address] °[place] [address] °[phone] ° ° °  °  °  ° °

## 2017-07-23 LAB — HM DIABETES EYE EXAM

## 2017-08-10 ENCOUNTER — Encounter: Payer: Self-pay | Admitting: Family Medicine

## 2017-08-10 ENCOUNTER — Ambulatory Visit (INDEPENDENT_AMBULATORY_CARE_PROVIDER_SITE_OTHER): Payer: Medicare Other | Admitting: Family Medicine

## 2017-08-10 VITALS — BP 130/80 | HR 70 | Resp 16 | Ht 66.0 in | Wt 145.6 lb

## 2017-08-10 DIAGNOSIS — N183 Chronic kidney disease, stage 3 (moderate): Secondary | ICD-10-CM

## 2017-08-10 DIAGNOSIS — I10 Essential (primary) hypertension: Secondary | ICD-10-CM | POA: Diagnosis not present

## 2017-08-10 DIAGNOSIS — I872 Venous insufficiency (chronic) (peripheral): Secondary | ICD-10-CM

## 2017-08-10 DIAGNOSIS — E785 Hyperlipidemia, unspecified: Secondary | ICD-10-CM

## 2017-08-10 DIAGNOSIS — F3340 Major depressive disorder, recurrent, in remission, unspecified: Secondary | ICD-10-CM

## 2017-08-10 DIAGNOSIS — N3941 Urge incontinence: Secondary | ICD-10-CM

## 2017-08-10 DIAGNOSIS — E1122 Type 2 diabetes mellitus with diabetic chronic kidney disease: Secondary | ICD-10-CM

## 2017-08-10 DIAGNOSIS — D649 Anemia, unspecified: Secondary | ICD-10-CM

## 2017-08-10 LAB — POCT GLYCOSYLATED HEMOGLOBIN (HGB A1C): Hemoglobin A1C: 5.6

## 2017-08-10 MED ORDER — TELMISARTAN 40 MG PO TABS: 40.0000 mg | ORAL_TABLET | Freq: Every day | ORAL | 1 refills | Status: DC

## 2017-08-10 MED ORDER — PRAVASTATIN SODIUM 40 MG PO TABS: ORAL_TABLET | ORAL | 1 refills | Status: DC

## 2017-08-10 NOTE — Progress Notes (Signed)
Name: Melanie Gray   MRN: [id number]    DOB: [birth year]   Date:08/10/2017       Progress Note  Subjective  Chief Complaint  Chief Complaint  Patient presents with  . Diabetes  . Hypertension  . Hyperlipidemia    HPI  HTN: she is now on Losartan but worried about recall, bp is at goal., no chest pain or palpitation  Diabetes with renal manifestation: GFR stable, on ARB, urine micro is back to normal,  she denies polyphagia, polydipsia or polyuria. She follows a diabetic diet  She has been doing well, states up to date with eye exam and recent left eye cataract surgery.  We will get records.   Hyperlipidemia: taking Pravastatin, denies myalgia , compliant with medication.   Major Depression: She denies being depressed, she states she is an introvert, she isolates herself on purpose.  She states she feels fine, she does not feel depressed. She has a friend that recently lost her husband and she has been supportive to her.   Varicose veins: she states that compression stocking hoses is helping with pain and also bulging of her varicose veins  Urge incontinence: she states not going to church because has to void during the service, she never started taking medication and states she is doing well.    Patient Active Problem List   Diagnosis Date Noted  . Depression, major, recurrent, in remission (HCC) 08/10/2017  . Varicose veins of both lower extremities with inflammation 12/01/2016  . Chronic venous insufficiency 12/01/2016  . Pain in limb 12/01/2016  . Benign hypertension 11/06/2014  . Chronic anemia 11/06/2014  . Seborrheic dermatitis 11/06/2014  . Diabetes mellitus with renal manifestation (HCC) 11/06/2014  . Dyslipidemia 11/06/2014  . Cephalalgia 11/06/2014  . Deafness, sensorineural 11/06/2014  . Liver regeneration 11/06/2014  . Arthritis of knee, degenerative 11/06/2014  . Panic attack 11/06/2014  . Restless legs syndrome 11/06/2014  . Allergic rhinitis 11/06/2014  .  Spinal stenosis 11/06/2014  . Osteopenia 05/09/2009  . Chronic kidney disease (CKD), stage III (moderate) (HCC) 04/20/2008    Past Surgical History:  Procedure Laterality Date  . ABDOMINAL HYSTERECTOMY    . CATARACT EXTRACTION W/PHACO Left 07/16/2017   Procedure: CATARACT EXTRACTION PHACO AND INTRAOCULAR LENS PLACEMENT (IOC);  Surgeon: [name], MD;  Location: [facility] ORS;  Service: Ophthalmology;  Laterality: Left;  [place] 01:27.0 AP% 21.0 CDE 18.28 Fluid Pack Lot # [id number]  . CHOLECYSTECTOMY    . HERNIA REPAIR      Family History  Problem Relation Age of Onset  . Cancer Mother   . Cancer Sister   . Breast cancer Neg Hx     Social History   Socioeconomic History  . Marital status: Widowed    Spouse name: Not on file  . Number of children: Not on file  . Years of education: Not on file  . Highest education level: Not on file  Social Needs  . Financial resource strain: Not on file  . Food insecurity - worry: Not on file  . Food insecurity - inability: Not on file  . Transportation needs - medical: Not on file  . Transportation needs - non-medical: Not on file  Occupational History  . Occupation: unemployed  Tobacco Use  . Smoking status: Never Smoker  . Smokeless tobacco: Never Used  Substance and Sexual Activity  . Alcohol use: No    Alcohol/week: 0.0 oz  . Drug use: No  . Sexual activity: No  Other Topics Concern  .  Not on file  Social History Narrative  . Not on file     Current Outpatient Medications:  .  acetaminophen (TYLENOL) 500 MG tablet, Take 1,000 mg by mouth daily as needed for moderate pain or headache., Disp: , Rfl:  .  aspirin EC 81 MG tablet, Take 81 mg by mouth daily., Disp: , Rfl:  .  Cholecalciferol (VITAMIN D) 2000 UNITS tablet, Take 2,000 Units by mouth every evening. , Disp: , Rfl:  .  Glucosamine-Chondroitin (OSTEO BI-FLEX REGULAR STRENGTH PO), Take 1 tablet by mouth daily., Disp: , Rfl:  .  Multiple Vitamin (MULTI-VITAMINS) TABS,  Take 1 tablet by mouth every evening. , Disp: , Rfl:  .  Polyethyl Glycol-Propyl Glycol (SYSTANE OP), Apply 1 drop to eye daily as needed (dry eyes)., Disp: , Rfl:  .  pravastatin (PRAVACHOL) 40 MG tablet, TAKE 1 TABLET (40 MG TOTAL) BY MOUTH DAILY., Disp: 90 tablet, Rfl: 1 .  telmisartan (MICARDIS) 40 MG tablet, Take 1 tablet (40 mg total) by mouth daily. In place of losartan, Disp: 90 tablet, Rfl: 1  Allergies  Allergen Reactions  . Sulfa Antibiotics Rash     ROS  Constitutional: Negative for fever or weight change.  Respiratory: Negative for cough and shortness of breath.   Cardiovascular: Negative for chest pain or palpitations.  Gastrointestinal: Negative for abdominal pain, no bowel changes.  Musculoskeletal: Negative for gait problem or joint swelling.  Skin: Negative for rash.  Neurological: Negative for dizziness or headache.  No other specific complaints in a complete review of systems (except as listed in HPI above).  Objective  Vitals:   08/10/17 1003  BP: 130/80  Pulse: 70  Resp: 16  SpO2: 95%  Weight: 145 lb 9.6 oz (66 kg)  Height: 5\' 6"  (1.676 m)    Body mass index is 23.5 kg/m.  Physical Exam  Constitutional: Patient appears well-developed and well-nourished. Obese  No distress.  HEENT: head atraumatic, normocephalic, pupils equal and reactive to light,  neck supple, throat within normal limits Cardiovascular: Normal rate, regular rhythm and normal heart sounds.  No murmur heard. No BLE edema. Pulmonary/Chest: Effort normal and breath sounds normal. No respiratory distress. Abdominal: Soft.  There is no tenderness. Psychiatric: Patient has a normal mood and affect. behavior is normal. Judgment and thought content normal.  Recent Results (from the past 2160 hour(s))  Glucose, capillary     Status: None   Collection Time: 07/16/17  6:17 AM  Result Value Ref Range   Glucose-Capillary 89 65 - 99 mg/dL  POCT HgB Q6V     Status: Normal   Collection Time:  08/10/17 10:09 AM  Result Value Ref Range   Hemoglobin A1C 5.6     Diabetic Foot Exam: Diabetic Foot Exam - Simple   Simple Foot Form Diabetic Foot exam was performed with the following findings:  Yes 08/10/2017 10:40 AM  Visual Inspection See comments:  Yes Sensation Testing Intact to touch and monofilament testing bilaterally:  Yes Pulse Check Posterior Tibialis and Dorsalis pulse intact bilaterally:  Yes Comments Thick toenails, corn on 5th right toe      PHQ2/9: Depression screen [facility] 2/9 04/08/2017 11/27/2016 07/28/2016 03/28/2016 11/26/2015  Decreased Interest 0 0 0 0 0  Down, Depressed, Hopeless 0 0 0 0 0  PHQ - 2 Score 0 0 0 0 0     Fall Risk: Fall Risk  04/08/2017 11/27/2016 07/28/2016 03/28/2016 11/26/2015  Falls in the past year? No Yes Yes No No  Number  falls in past yr: - 1 1 - -  Comment - Christmas Eve - - -  Injury with Fall? - Yes Yes - -  Comment - Broken some ribs Fall in [place] and fracture Ribs  - -  Risk for fall due to : - - - - -     Functional Status Survey: Is the patient deaf or have difficulty hearing?: No Does the patient have difficulty seeing, even when wearing glasses/contacts?: No Does the patient have difficulty concentrating, remembering, or making decisions?: No Does the patient have difficulty walking or climbing stairs?: No Does the patient have difficulty dressing or bathing?: No Does the patient have difficulty doing errands alone such as visiting a doctor's office or shopping?: No    Assessment & Plan  1. Type 2 diabetes mellitus with stage 3 chronic kidney disease, without long-term current use of insulin (HCC)  - POCT HgB A1C - telmisartan (MICARDIS) 40 MG tablet; Take 1 tablet (40 mg total) by mouth daily. In place of losartan  Dispense: 90 tablet; Refill: 1  2. Dyslipidemia  - pravastatin (PRAVACHOL) 40 MG tablet; TAKE 1 TABLET (40 MG TOTAL) BY MOUTH DAILY.  Dispense: 90 tablet; Refill: 1 - Lipid panel   3.  Depression, major, recurrent, in remission [facility](HCC)  Doing well now  4. Urge incontinence of urine  Never started medication, states she has been doing better now  5. Benign hypertension  Worried about losartan being recalled - telmisartan (MICARDIS) 40 MG tablet; Take 1 tablet (40 mg total) by mouth daily. In place of losartan  Dispense: 90 tablet; Refill: 1 - Comp panel   6. Chronic venous insufficiency  stable HTN: she is now only Diovan, off HCTZ and bp is at goal. BP at home has been around 120's-130's No chest pain, palpitation or SOB   7. Anemia, unspecified type  - CBC with Differential/Platelet - Iron, TIBC and Ferritin Panel; Future

## 2017-08-11 LAB — COMPLETE METABOLIC PANEL WITH GFR
AG RATIO: 1.4 (calc) (ref 1.0–2.5)
ALT: 14 U/L (ref 6–29)
AST: 21 U/L (ref 10–35)
Albumin: 4.4 g/dL (ref 3.6–5.1)
Alkaline phosphatase (APISO): 73 U/L (ref 33–130)
BUN/Creatinine Ratio: 25 (calc) — ABNORMAL HIGH (ref 6–22)
BUN: 35 mg/dL — ABNORMAL HIGH (ref 7–25)
CALCIUM: 9.7 mg/dL (ref 8.6–10.4)
CO2: 26 mmol/L (ref 20–32)
Chloride: 107 mmol/L (ref 98–110)
Creat: 1.38 mg/dL — ABNORMAL HIGH (ref 0.60–0.93)
GFR, EST NON AFRICAN AMERICAN: 37 mL/min/{1.73_m2} — AB (ref >=60)
GFR, Est African American: 42 mL/min/{1.73_m2} — ABNORMAL LOW (ref >=60)
GLUCOSE: 98 mg/dL (ref 65–99)
Globulin: 3.1 g/dL (calc) (ref 1.9–3.7)
POTASSIUM: 3.9 mmol/L (ref 3.5–5.3)
Sodium: 142 mmol/L (ref 135–146)
Total Bilirubin: 0.5 mg/dL (ref 0.2–1.2)
Total Protein: 7.5 g/dL (ref 6.1–8.1)

## 2017-08-11 LAB — CBC WITH DIFFERENTIAL/PLATELET
BASOS PCT: 0.6 %
Basophils Absolute: 31 cells/uL (ref 0–200)
EOS ABS: 31 {cells}/uL (ref 15–500)
Eosinophils Relative: 0.6 %
HCT: 36.8 % (ref 35.0–45.0)
Hemoglobin: 11.9 g/dL (ref 11.7–15.5)
Lymphs Abs: 1637 cells/uL (ref 850–3900)
MCH: 28.1 pg (ref 27.0–33.0)
MCHC: 32.3 g/dL (ref 32.0–36.0)
MCV: 86.8 fL (ref 80.0–100.0)
MONOS PCT: 10.3 %
MPV: 11.1 fL (ref 7.5–12.5)
Neutro Abs: 2876 cells/uL (ref 1500–7800)
Neutrophils Relative %: 56.4 %
PLATELETS: 218 10*3/uL (ref 140–400)
RBC: 4.24 10*6/uL (ref 3.80–5.10)
RDW: 14 % (ref 11.0–15.0)
TOTAL LYMPHOCYTE: 32.1 %
WBC mixed population: 525 cells/uL (ref 200–950)
WBC: 5.1 10*3/uL (ref 3.8–10.8)

## 2017-08-11 LAB — LIPID PANEL
Cholesterol: 217 mg/dL — ABNORMAL HIGH (ref <200)
HDL: 127 mg/dL (ref >50)
LDL Cholesterol (Calc): 80 mg/dL (calc)
Non-HDL Cholesterol (Calc): 90 mg/dL (calc) (ref <130)
TRIGLYCERIDES: 36 mg/dL (ref <150)
Total CHOL/HDL Ratio: 1.7 (calc) (ref <5.0)

## 2017-08-12 ENCOUNTER — Other Ambulatory Visit: Payer: Self-pay | Admitting: Family Medicine

## 2017-08-12 DIAGNOSIS — N183 Chronic kidney disease, stage 3 unspecified: Secondary | ICD-10-CM

## 2017-08-13 ENCOUNTER — Encounter: Payer: Self-pay | Admitting: Family Medicine

## 2017-09-11 ENCOUNTER — Ambulatory Visit (INDEPENDENT_AMBULATORY_CARE_PROVIDER_SITE_OTHER): Payer: Medicare Other

## 2017-09-11 VITALS — BP 122/76 | HR 62 | Temp 97.0°F | Resp 12 | Ht 66.0 in | Wt 145.7 lb

## 2017-09-11 DIAGNOSIS — Z0000 Encounter for general adult medical examination without abnormal findings: Secondary | ICD-10-CM

## 2017-09-11 NOTE — Progress Notes (Addendum)
Subjective:   Melanie Gray is a 77 y.o. female who presents for Medicare Annual (Subsequent) preventive examination.  Review of Systems:  N/A Cardiac Risk Factors include: advanced age (>17men, >14 women);diabetes mellitus;dyslipidemia;hypertension     Objective:     Vitals: BP 122/76 (BP Location: Left Arm, Patient Position: Sitting)   Pulse 62   Temp (!) 97 F (36.1 C) (Oral)   Resp 12   Ht 5\' 6"  (1.676 m)   Wt 145 lb 11.2 oz (66.1 kg)   BMI 23.52 kg/m   Body mass index is 23.52 kg/m.  Advanced Directives 09/11/2017 04/08/2017 03/25/2017 12/01/2016 11/27/2016 07/16/2016 07/06/2016  Does Patient Have a Medical Advance Directive? No No No No No No No  Would patient like information on creating a medical advance directive? Yes (MAU/Ambulatory/Procedural Areas - Information given) - - - - - No - Patient declined    Tobacco Social History   Tobacco Use  Smoking Status Never Smoker  Smokeless Tobacco Never Used  Tobacco Comment   smoking cessation materials not required     Counseling given: No Comment: smoking cessation materials not required   Clinical Intake:  Pre-visit preparation completed: Yes  Pain : No/denies pain   BMI - recorded: 23.5 Nutritional Status: BMI of 19-24  Normal Nutritional Risks: None Diabetes: Yes CBG done?: No CBG resulted in Enter/ Edit results?: No Did pt. bring in CBG monitor from home?: No  How often do you need to have someone help you when you read instructions, pamphlets, or other written materials from your doctor or pharmacy?: 1 - Never  Interpreter Needed?: No  Information entered by :: AEversole, LPN   Hospitalizations, surgeries, and ER visits occurring within the previous 12 months:  Within the previous 12 months, pt has not been hospitalized for any conditions and has not been treated by an emergency room clinician. Pt did undergo surgery on 07/16/17 @ [facility] for cataract extraction and intraocular placement L eye.   Past  Medical History:  Diagnosis Date  . Chronic kidney disease    RENAL INSUFF  . Complication of anesthesia    HA , N/V  . Diabetes mellitus without complication (HCC)   . Hyperlipidemia   . Hypertension   . Knee instability    RIGHT KNEE WEAKNESS  . PONV (postoperative nausea and vomiting)   . Vertigo    Past Surgical History:  Procedure Laterality Date  . ABDOMINAL HYSTERECTOMY    . CATARACT EXTRACTION W/PHACO Left 07/16/2017   Procedure: CATARACT EXTRACTION PHACO AND INTRAOCULAR LENS PLACEMENT (IOC);  Surgeon: [name], MD;  Location: [facility] ORS;  Service: Ophthalmology;  Laterality: Left;  [place] 01:27.0 AP% 21.0 CDE 18.28 Fluid Pack Lot # [id number]  . CHOLECYSTECTOMY    . HERNIA REPAIR     Family History  Problem Relation Age of Onset  . Cancer Mother   . Diabetes Father   . Cancer Sister   . Breast cancer Neg Hx    Social History   Socioeconomic History  . Marital status: Widowed    Spouse name: Fayrene Fearing  . Number of children: 2  . Years of education: None  . Highest education level: 12th grade  Social Needs  . Financial resource strain: Not hard at all  . Food insecurity - worry: Never true  . Food insecurity - inability: Never true  . Transportation needs - medical: No  . Transportation needs - non-medical: No  Occupational History    Employer: RETIRED  Tobacco Use  .  Smoking status: Never Smoker  . Smokeless tobacco: Never Used  . Tobacco comment: smoking cessation materials not required  Substance and Sexual Activity  . Alcohol use: No    Alcohol/week: 0.0 oz  . Drug use: No  . Sexual activity: No  Other Topics Concern  . None  Social History Narrative  . None    Outpatient Encounter Medications as of 09/11/2017  Medication Sig  . acetaminophen (TYLENOL) 500 MG tablet Take 1,000 mg by mouth daily as needed for moderate pain or headache.  Marland Kitchen aspirin EC 81 MG tablet Take 81 mg by mouth daily.  . Cholecalciferol (VITAMIN D) 2000 UNITS tablet Take  2,000 Units by mouth every evening.   . Glucosamine-Chondroitin (OSTEO BI-FLEX REGULAR STRENGTH PO) Take 1 tablet by mouth daily.  . Multiple Vitamin (MULTI-VITAMINS) TABS Take 1 tablet by mouth every evening.   Bertram Gala Glycol-Propyl Glycol (SYSTANE OP) Apply 1 drop to eye daily as needed (dry eyes).  . pravastatin (PRAVACHOL) 40 MG tablet TAKE 1 TABLET (40 MG TOTAL) BY MOUTH DAILY.  Marland Kitchen telmisartan (MICARDIS) 40 MG tablet Take 1 tablet (40 mg total) by mouth daily. In place of losartan   No facility-administered encounter medications on file as of 09/11/2017.     Activities of Daily Living In your present state of health, do you have any difficulty performing the following activities: 09/11/2017 08/10/2017  Hearing? N N  Comment denies hearing aids -  Vision? N N  Comment wears eyeglasses -  Difficulty concentrating or making decisions? N N  Walking or climbing stairs? Y N  Comment pain in R knee -  Dressing or bathing? N N  Doing errands, shopping? N N  Preparing Food and eating ? N -  Comment denies dentures -  Using the Toilet? N -  In the past six months, have you accidently leaked urine? N -  Do you have problems with loss of bowel control? N -  Managing your Medications? N -  Managing your Finances? N -  Housekeeping or managing your Housekeeping? N -  Some recent data might be hidden    Patient Care Team: Alba Cory, MD as PCP - General (Family Medicine) [name], MD as Consulting Physician (Ophthalmology)    Assessment:   This is a routine wellness examination for [facility].  Exercise Activities and Dietary recommendations Current Exercise Habits: Home exercise routine, Type of exercise: walking;Other - see comments(stationary bike), Time (Minutes): 60, Frequency (Times/Week): 7, Weekly Exercise (Minutes/Week): 420, Intensity: Mild, Exercise limited by: orthopedic condition(s);Other - see comments(pain in R knee and R lower extremity vascular condition)  Goals     . DIET - INCREASE WATER INTAKE     Recommend to drink at least 6-8 8oz glasses of water per day.       Fall Risk Fall Risk  09/11/2017 04/08/2017 11/27/2016 07/28/2016 03/28/2016  Falls in the past year? No No Yes Yes No  Number falls in past yr: - - 1 1 -  Comment - - Christmas Eve - -  Injury with Fall? - - Yes Yes -  Comment - - Broken some ribs Fall in [place] on Dec. 24 and fracture Ribs  -  Risk for fall due to : Impaired balance/gait;History of fall(s);Impaired vision - - - -  Risk for fall due to: Comment pain R knee; walks with cane; wears eyeglasses - - - -   Is the patient's home free of loose throw rugs in walkways, pet beds, electrical  cords, etc?   Yes Does the patient have any grab bars in the bathroom? No  Does the patient use a shower chair when bathing? No Does the patient have any stairs in or around the home? Yes If so, are there any handrails?  Yes Does the patient have adequate lighting?  Yes Does the patient use a cane, walker or w/c? Yes, use of cane Does the patient use of an elevated toilet seat? Yes  Timed Get Up and Go Performed: Yes. Pt ambulated 10 feet within 18 sec. Gait slow, steady and with the use of a cane. No intervention required at this time. Fall risk prevention has been discussed.  Pt declined my offer to send Community Resource Referral to Care Guide for  installation of grab bars in the shower, shower chair or an elevated toilet seat.  Depression Screen PHQ 2/9 Scores 09/11/2017 04/08/2017 11/27/2016 07/28/2016  PHQ - 2 Score 0 0 0 0  PHQ- 9 Score 0 - - -  Exception Documentation - - - -  Not completed - - - -     Cognitive Function     6CIT Screen 09/11/2017  What Year? 0 points  What month? 0 points  What time? 0 points  Count back from 20 0 points  Months in reverse 0 points  Repeat phrase 6 points  Total Score 6    Immunization History  Administered Date(s) Administered  . Influenza Split 03/25/2010  . Influenza, High Dose  Seasonal PF 03/26/2015, 03/28/2016, 04/08/2017  . Influenza, Seasonal, Injecte, Preservative Fre 04/02/2011, 06/16/2012  . Influenza,inj,Quad PF,6+ Mos 02/24/2014  . Influenza-Unspecified 02/24/2014  . Pneumococcal Conjugate-13 03/26/2015  . Pneumococcal Polysaccharide-23 07/25/2010  . Tdap 07/21/2006  . Zoster 06/24/2013    Qualifies for Shingles Vaccine? Yes. Zostavax completed 06/24/13. Due for Shingrix vaccine. Education has been provided regarding the importance of this vaccine. Pt has been advised to call her insurance company to determine her out of pocket expense. Advised she may also receive this vaccine at her local pharmacy or Health Dept. Verbalized acceptance and understanding.  Due for Tdap vaccine. Declined my offer to administer today. Education has been provided regarding the importance of this vaccine but still declined. Pt has been advised to call our office is she should change her mind and wish to receive this vaccine. Also advised she may receive this vaccine at her local pharmacy or Health Dept. Verbalized acceptance and understanding.  Screening Tests Health Maintenance  Topic Date Due  . TETANUS/TDAP  09/12/2018 (Originally 07/21/2016)  . HEMOGLOBIN A1C  02/07/2018  . OPHTHALMOLOGY EXAM  07/23/2018  . FOOT EXAM  08/10/2018  . INFLUENZA VACCINE  Completed  . DEXA SCAN  Completed  . PNA vac Low Risk Adult  Completed    Cancer Screenings: Lung: Low Dose CT Chest recommended if Age 45-80 years, 30 pack-year currently smoking OR have quit w/in 15years. Patient does not qualify. NON-SMOKER Breast:  Up to date on Mammogram? Yes. Completed 04/27/17. No longer required   Up to date of Bone Density/Dexa? Yes. Completed 11/14/15. No longer required Colorectal: Completed colonoscopy 06/25/09. No longer required  Additional Screenings: Hepatitis B/HIV/Syphillis: Does not qualify Hepatitis C Screening: Does not qualify     Plan:  I have personally reviewed and addressed  the Medicare Annual Wellness questionnaire and have noted the following in the patient's chart:  A. Medical and social history B. Use of alcohol, tobacco or illicit drugs  C. Current medications and supplements D. Functional ability  and status E.  Nutritional status F.  Physical activity G. Advance directives H. List of other physicians I.  Hospitalizations, surgeries, and ER visits in previous 12 months J.  Vitals K. Screenings such as hearing and vision if needed, cognitive and depression L. Referrals and appointments - none  In addition, I have reviewed and discussed with patient certain preventive protocols, quality metrics, and best practice recommendations. A written personalized care plan for preventive services as well as general preventive health recommendations were provided to patient.  See attached scanned questionnaire for additional information.   Signed,  Deon PillingAmmie Caleigha Zale, LPN Nurse Health Advisor  I have reviewed this encounter including the documentation in this note and/or discussed this patient with the provider, Deon PillingAmmie Adda Stokes, LPN. I am certifying that I agree with the content of this note as supervising physician.  Alba CoryKrichna Sowles, MD [facility] 09/11/2017, 1:14 PM

## 2017-09-11 NOTE — Patient Instructions (Signed)
[name] , Thank you for taking time to come for your Medicare Wellness Visit. I appreciate your ongoing commitment to your health goals. Please review the following plan we discussed and let me know if I can assist you in the future.   Screening recommendations/referrals: Colorectal Screening: Completed 06/25/09. No longer required Mammogram: Completed 04/27/17. No longer required Bone Density: Completed 11/14/15. No longer required Lung Cancer Screening: You do not qualify for this screening Hepatitis C Screening: You do not qualify for this screening HIV/Syphilis/Hepatitis B Screening: You do not qualify for this screening   Vision/Dental/Diabetic Exams: Diabetic Exams: Recommend annual diabetic eye exams for retinopathy and diabetic foot exams.  Diabetic Eye Exam: Up to date Diabetic Foot Exam: Up to date Recommended yearly ophthalmology/optometry visit for glaucoma screening and checkup Recommended yearly dental visit for hygiene and checkup  Vaccinations: Influenza vaccine: Up to date Pneumococcal vaccine: Up to date Tdap vaccine: Declined. Please call your insurance company to determine your out of pocket expense. You may also receive this vaccine at your local pharmacy or Health Dept. Shingles vaccine: Please call your insurance company to determine your out of pocket expense for the Shingrix vaccine. You may also receive this vaccine at your local pharmacy or Health Dept.  Advanced directives: Please bring a copy of your POA (Power of Attorney) and/or Living Will to your next appointment.  Conditions/risks identified: Recommend to drink at least 6-8 8oz glasses of water per day.  Next appointment: Please schedule an appointment with Dr. Carlynn Purl within the next 30 days for follow up after today's Annual Wellness Visit.  Please schedule your Annual Wellness Visit with your Nurse Health Advisor in one year.  Preventive Care 52 Years and Older, Female Preventive care refers to  lifestyle choices and visits with your health care provider that can promote health and wellness. What does preventive care include?  A yearly physical exam. This is also called an annual well check.  Dental exams once or twice a year.  Routine eye exams. Ask your health care provider how often you should have your eyes checked.  Personal lifestyle choices, including:  Daily care of your teeth and gums.  Regular physical activity.  Eating a healthy diet.  Avoiding tobacco and drug use.  Limiting alcohol use.  Practicing safe sex.  Taking low-dose aspirin every day.  Taking vitamin and mineral supplements as recommended by your health care provider. What happens during an annual well check? The services and screenings done by your health care provider during your annual well check will depend on your age, overall health, lifestyle risk factors, and family history of disease. Counseling  Your health care provider may ask you questions about your:  Alcohol use.  Tobacco use.  Drug use.  Emotional well-being.  Home and relationship well-being.  Sexual activity.  Eating habits.  History of falls.  Memory and ability to understand (cognition).  Work and work Astronomer.  Reproductive health. Screening  You may have the following tests or measurements:  Height, weight, and BMI.  Blood pressure.  Lipid and cholesterol levels. These may be checked every 5 years, or more frequently if you are over 60 years old.  Skin check.  Lung cancer screening. You may have this screening every year starting at age 24 if you have a 30-pack-year history of smoking and currently smoke or have quit within the past 15 years.  Fecal occult blood test (FOBT) of the stool. You may have this test every year starting at  age 29.  Flexible sigmoidoscopy or colonoscopy. You may have a sigmoidoscopy every 5 years or a colonoscopy every 10 years starting at age 33.  Hepatitis C blood  test.  Hepatitis B blood test.  Sexually transmitted disease (STD) testing.  Diabetes screening. This is done by checking your blood sugar (glucose) after you have not eaten for a while (fasting). You may have this done every 1-3 years.  Bone density scan. This is done to screen for osteoporosis. You may have this done starting at age 46.  Mammogram. This may be done every 1-2 years. Talk to your health care provider about how often you should have regular mammograms. Talk with your health care provider about your test results, treatment options, and if necessary, the need for more tests. Vaccines  Your health care provider may recommend certain vaccines, such as:  Influenza vaccine. This is recommended every year.  Tetanus, diphtheria, and acellular pertussis (Tdap, Td) vaccine. You may need a Td booster every 10 years.  Zoster vaccine. You may need this after age 71.  Pneumococcal 13-valent conjugate (PCV13) vaccine. One dose is recommended after age 45.  Pneumococcal polysaccharide (PPSV23) vaccine. One dose is recommended after age 36. Talk to your health care provider about which screenings and vaccines you need and how often you need them. This information is not intended to replace advice given to you by your health care provider. Make sure you discuss any questions you have with your health care provider. Document Released: 07/27/2015 Document Revised: 03/19/2016 Document Reviewed: 05/01/2015 Elsevier Interactive Patient Education  2017 [place] Prevention in the Home Falls can cause injuries. They can happen to people of all ages. There are many things you can do to make your home safe and to help prevent falls. What can I do on the outside of my home?  Regularly fix the edges of walkways and driveways and fix any cracks.  Remove anything that might make you trip as you walk through a door, such as a raised step or threshold.  Trim any bushes or trees on the  path to your home.  Use bright outdoor lighting.  Clear any walking paths of anything that might make someone trip, such as rocks or tools.  Regularly check to see if handrails are loose or broken. Make sure that both sides of any steps have handrails.  Any raised decks and porches should have guardrails on the edges.  Have any leaves, snow, or ice cleared regularly.  Use sand or salt on walking paths during winter.  Clean up any spills in your garage right away. This includes oil or grease spills. What can I do in the bathroom?  Use night lights.  Install grab bars by the toilet and in the tub and shower. Do not use towel bars as grab bars.  Use non-skid mats or decals in the tub or shower.  If you need to sit down in the shower, use a plastic, non-slip stool.  Keep the floor dry. Clean up any water that spills on the floor as soon as it happens.  Remove soap buildup in the tub or shower regularly.  Attach bath mats securely with double-sided non-slip rug tape.  Do not have throw rugs and other things on the floor that can make you trip. What can I do in the bedroom?  Use night lights.  Make sure that you have a light by your bed that is easy to reach.  Do not use any  sheets or blankets that are too big for your bed. They should not hang down onto the floor.  Have a firm chair that has side arms. You can use this for support while you get dressed.  Do not have throw rugs and other things on the floor that can make you trip. What can I do in the kitchen?  Clean up any spills right away.  Avoid walking on wet floors.  Keep items that you use a lot in easy-to-reach places.  If you need to reach something above you, use a strong step stool that has a grab bar.  Keep electrical cords out of the way.  Do not use floor polish or wax that makes floors slippery. If you must use wax, use non-skid floor wax.  Do not have throw rugs and other things on the floor that can  make you trip. What can I do with my stairs?  Do not leave any items on the stairs.  Make sure that there are handrails on both sides of the stairs and use them. Fix handrails that are broken or loose. Make sure that handrails are as long as the stairways.  Check any carpeting to make sure that it is firmly attached to the stairs. Fix any carpet that is loose or worn.  Avoid having throw rugs at the top or bottom of the stairs. If you do have throw rugs, attach them to the floor with carpet tape.  Make sure that you have a light switch at the top of the stairs and the bottom of the stairs. If you do not have them, ask someone to add them for you. What else can I do to help prevent falls?  Wear shoes that:  Do not have high heels.  Have rubber bottoms.  Are comfortable and fit you well.  Are closed at the toe. Do not wear sandals.  If you use a stepladder:  Make sure that it is fully opened. Do not climb a closed stepladder.  Make sure that both sides of the stepladder are locked into place.  Ask someone to hold it for you, if possible.  Clearly mark and make sure that you can see:  Any grab bars or handrails.  First and last steps.  Where the edge of each step is.  Use tools that help you move around (mobility aids) if they are needed. These include:  Canes.  Walkers.  Scooters.  Crutches.  Turn on the lights when you go into a dark area. Replace any light bulbs as soon as they burn out.  Set up your furniture so you have a clear path. Avoid moving your furniture around.  If any of your floors are uneven, fix them.  If there are any pets around you, be aware of where they are.  Review your medicines with your doctor. Some medicines can make you feel dizzy. This can increase your chance of falling. Ask your doctor what other things that you can do to help prevent falls. This information is not intended to replace advice given to you by your health care  provider. Make sure you discuss any questions you have with your health care provider. Document Released: 04/26/2009 Document Revised: 12/06/2015 Document Reviewed: 08/04/2014 Elsevier Interactive Patient Education  2017 [employer]

## 2017-09-24 DIAGNOSIS — N390 Urinary tract infection, site not specified: Secondary | ICD-10-CM | POA: Diagnosis not present

## 2017-09-24 DIAGNOSIS — N183 Chronic kidney disease, stage 3 (moderate): Secondary | ICD-10-CM | POA: Diagnosis not present

## 2017-09-24 DIAGNOSIS — I10 Essential (primary) hypertension: Secondary | ICD-10-CM | POA: Diagnosis not present

## 2017-09-24 LAB — CBC AND DIFFERENTIAL
HCT: 37 (ref 36–46)
HEMOGLOBIN: 11.8 — AB (ref 12.0–16.0)
Platelets: 228 (ref 150–399)
WBC: 4.8

## 2017-09-24 LAB — BASIC METABOLIC PANEL
BUN: 23 — AB (ref 4–21)
Creatinine: 1.2 — AB (ref 0.5–1.1)
GLUCOSE: 100
Potassium: 4.3 (ref 3.4–5.3)
Sodium: 144 (ref 137–147)

## 2017-09-29 DIAGNOSIS — N183 Chronic kidney disease, stage 3 (moderate): Secondary | ICD-10-CM | POA: Diagnosis not present

## 2017-09-30 ENCOUNTER — Encounter: Payer: Self-pay | Admitting: Family Medicine

## 2017-10-01 ENCOUNTER — Encounter: Payer: Self-pay | Admitting: Family Medicine

## 2017-10-12 ENCOUNTER — Ambulatory Visit: Payer: Self-pay | Admitting: Family Medicine

## 2018-01-05 ENCOUNTER — Other Ambulatory Visit: Payer: Self-pay | Admitting: Family Medicine

## 2018-01-05 DIAGNOSIS — I10 Essential (primary) hypertension: Secondary | ICD-10-CM

## 2018-01-05 DIAGNOSIS — E1122 Type 2 diabetes mellitus with diabetic chronic kidney disease: Secondary | ICD-10-CM

## 2018-01-05 DIAGNOSIS — N183 Chronic kidney disease, stage 3 unspecified: Secondary | ICD-10-CM

## 2018-01-05 NOTE — Telephone Encounter (Signed)
Refill request for Hypertension medication:  Telmisartan 40 mg  Last office visit pertaining to hypertension: 08/10/2017  BP Readings from Last 3 Encounters:  09/11/17 122/76  08/10/17 130/80  07/16/17 140/81     Lab Results  Component Value Date   CREATININE 1.2 (A) 09/24/2017   BUN 23 (A) 09/24/2017   NA 144 09/24/2017   K 4.3 09/24/2017   CL 107 08/10/2017   CO2 26 08/10/2017   Follow-ups on file. 02/08/2018

## 2018-02-08 ENCOUNTER — Encounter: Payer: Self-pay | Admitting: Family Medicine

## 2018-02-08 ENCOUNTER — Ambulatory Visit (INDEPENDENT_AMBULATORY_CARE_PROVIDER_SITE_OTHER): Payer: Medicare Other | Admitting: Family Medicine

## 2018-02-08 VITALS — BP 142/80 | HR 102 | Temp 97.9°F | Resp 16 | Ht 66.0 in | Wt 145.7 lb

## 2018-02-08 DIAGNOSIS — I10 Essential (primary) hypertension: Secondary | ICD-10-CM

## 2018-02-08 DIAGNOSIS — D649 Anemia, unspecified: Secondary | ICD-10-CM | POA: Diagnosis not present

## 2018-02-08 DIAGNOSIS — N183 Chronic kidney disease, stage 3 unspecified: Secondary | ICD-10-CM

## 2018-02-08 DIAGNOSIS — I83811 Varicose veins of right lower extremities with pain: Secondary | ICD-10-CM | POA: Diagnosis not present

## 2018-02-08 DIAGNOSIS — E785 Hyperlipidemia, unspecified: Secondary | ICD-10-CM

## 2018-02-08 DIAGNOSIS — I872 Venous insufficiency (chronic) (peripheral): Secondary | ICD-10-CM | POA: Diagnosis not present

## 2018-02-08 DIAGNOSIS — E1122 Type 2 diabetes mellitus with diabetic chronic kidney disease: Secondary | ICD-10-CM | POA: Diagnosis not present

## 2018-02-08 DIAGNOSIS — M170 Bilateral primary osteoarthritis of knee: Secondary | ICD-10-CM | POA: Diagnosis not present

## 2018-02-08 DIAGNOSIS — F3340 Major depressive disorder, recurrent, in remission, unspecified: Secondary | ICD-10-CM

## 2018-02-08 LAB — POCT UA - MICROALBUMIN: MICROALBUMIN (UR) POC: 20 mg/L

## 2018-02-08 LAB — POCT GLYCOSYLATED HEMOGLOBIN (HGB A1C): HBA1C, POC (CONTROLLED DIABETIC RANGE): 5.7 % (ref 0.0–7.0)

## 2018-02-08 MED ORDER — TELMISARTAN 40 MG PO TABS: 40.0000 mg | ORAL_TABLET | Freq: Every day | ORAL | 1 refills | Status: DC

## 2018-02-08 MED ORDER — PRAVASTATIN SODIUM 40 MG PO TABS: ORAL_TABLET | ORAL | 1 refills | Status: DC

## 2018-02-08 NOTE — Progress Notes (Signed)
Name: Melanie Gray   MRN: [id number]    DOB: [birth year]   Date:02/08/2018       Progress Note  Subjective  Chief Complaint  Chief Complaint  Patient presents with  . Medication Refill  . Diabetes    Checks once weekly Average-100-110's  . Hyperlipidemia  . Depression  . Urge incontinence  . Varicose Veins  . Hypertension    States her BP is elevated had Dr. Tyrell Antonioappts but at home 120/70's    HPI  HTN: she is now on Micardis 40 mg and bp today slightly up , but usually at goal and we will continue current dose. She denies chest pain or palpitation  CIT of 6: could not recall address but otherwise normal, discussed [employer] and cross work puzzles.   Diabetes with renal manifestation: GFR stable, on ARB,urine micro is back to normal,she denies polyphagia, polydipsia or polyuria. She follows a diabetic diet. HgbA1C is still at goal. Not on medication. Dyslipidemia is also controlled with medication   Hyperlipidemia: taking Pravastatin, denies myalgia , compliant with medication. Unchanged. Reviewed labs   Major Depression: She denies being depressed, she states she is an introvert, she isolates herself on purpose. She states she feels fine. Her children are doing better now.   Varicose veins: she states that compression stocking hoses is helping with pain and also bulging of her varicose veins. Unchanged   Urge incontinence: she states not going to church because has to void during the service, she does not want to take medication  OA knee: history of surgery, he still uses a cane and needs a handicap form to be filled out   Patient Active Problem List   Diagnosis Date Noted  . Depression, major, recurrent, in remission (HCC) 08/10/2017  . Varicose veins of both lower extremities with inflammation 12/01/2016  . Chronic venous insufficiency 12/01/2016  . Pain in limb 12/01/2016  . Benign hypertension 11/06/2014  . Chronic anemia 11/06/2014  . Seborrheic dermatitis 11/06/2014   . Diabetes mellitus with renal manifestation (HCC) 11/06/2014  . Dyslipidemia 11/06/2014  . Cephalalgia 11/06/2014  . Deafness, sensorineural 11/06/2014  . Liver regeneration 11/06/2014  . Arthritis of knee, degenerative 11/06/2014  . Panic attack 11/06/2014  . Restless legs syndrome 11/06/2014  . Allergic rhinitis 11/06/2014  . Spinal stenosis 11/06/2014  . Osteopenia 05/09/2009  . Chronic kidney disease (CKD), stage III (moderate) (HCC) 04/20/2008    Past Surgical History:  Procedure Laterality Date  . ABDOMINAL HYSTERECTOMY    . CATARACT EXTRACTION W/PHACO Left 07/16/2017   Procedure: CATARACT EXTRACTION PHACO AND INTRAOCULAR LENS PLACEMENT (IOC);  Surgeon: [place] CraneKing, Bradley Mark, MD;  Location: [facility] ORS;  Service: Ophthalmology;  Laterality: Left;  US 01:27.0 AP% 21.0 CDE 18.28 Fluid Pack Lot # [id number]  . CHOLECYSTECTOMY    . HERNIA REPAIR      Family History  Problem Relation Age of Onset  . Cancer Mother   . Diabetes Father   . Cancer Sister   . Breast cancer Neg Hx     Social History   Socioeconomic History  . Marital status: Widowed    Spouse name: [name]  . Number of children: 2  . Years of education: Not on file  . Highest education level: 12th grade  Occupational History    Employer: RETIRED  Social Needs  . Financial resource strain: Not hard at all  . Food insecurity:    Worry: Never true    Inability: Never true  . Transportation needs:  Medical: No    Non-medical: No  Tobacco Use  . Smoking status: Never Smoker  . Smokeless tobacco: Never Used  . Tobacco comment: smoking cessation materials not required  Substance and Sexual Activity  . Alcohol use: No    Alcohol/week: 0.0 oz  . Drug use: No  . Sexual activity: Never  Lifestyle  . Physical activity:    Days per week: 7 days    Minutes per session: 60 min  . Stress: Not at all  Relationships  . Social connections:    Talks on phone: Patient refused    Gets together: Patient refused     Attends religious service: Patient refused    Active member of club or organization: Patient refused    Attends meetings of clubs or organizations: Patient refused    Relationship status: Widowed  . Intimate partner violence:    Fear of current or ex partner: No    Emotionally abused: No    Physically abused: No    Forced sexual activity: No  Other Topics Concern  . Not on file  Social History Narrative  . Not on file     Current Outpatient Medications:  .  acetaminophen (TYLENOL) 500 MG tablet, Take 1,000 mg by mouth daily as needed for moderate pain or headache., Disp: , Rfl:  .  aspirin EC 81 MG tablet, Take 81 mg by mouth daily., Disp: , Rfl:  .  Cholecalciferol (VITAMIN D) 2000 UNITS tablet, Take 2,000 Units by mouth every evening. , Disp: , Rfl:  .  Glucosamine-Chondroitin (OSTEO BI-FLEX REGULAR STRENGTH PO), Take 1 tablet by mouth daily., Disp: , Rfl:  .  Multiple Vitamin (MULTI-VITAMINS) TABS, Take 1 tablet by mouth every evening. , Disp: , Rfl:  .  pravastatin (PRAVACHOL) 40 MG tablet, TAKE 1 TABLET (40 MG TOTAL) BY MOUTH DAILY., Disp: 90 tablet, Rfl: 1 .  telmisartan (MICARDIS) 40 MG tablet, TAKE 1 TABLET BY MOUTH DAILY IN PLACE OF LOSARTAN, Disp: 90 tablet, Rfl: 1 .  Polyethyl Glycol-Propyl Glycol (SYSTANE OP), Apply 1 drop to eye daily as needed (dry eyes)., Disp: , Rfl:   Allergies  Allergen Reactions  . Sulfa Antibiotics Rash     ROS  Constitutional: Negative for fever or weight change.  Respiratory: Negative for cough and shortness of breath.   Cardiovascular: Negative for chest pain or palpitations.  Gastrointestinal: Negative for abdominal pain, no bowel changes.  Musculoskeletal: Positive  for gait problem and intermittent  joint swelling.  Skin: Negative for rash.  Neurological: Negative for dizziness or headache.  No other specific complaints in a complete review of systems (except as listed in HPI above).  Objective  Vitals:   02/08/18 0856  BP: (!)  142/80  Pulse: (!) 102  Resp: 16  Temp: 97.9 F (36.6 C)  TempSrc: Oral  SpO2: 96%  Weight: 145 lb 11.2 oz (66.1 kg)  Height: 5\' 6"  (1.676 m)    Body mass index is 23.52 kg/m.  Physical Exam  Constitutional: Patient appears well-developed and well-nourished. Obese No distress.  HEENT: head atraumatic, normocephalic, pupils equal and reactive to light,  neck supple, throat within normal limits Cardiovascular: Normal rate, regular rhythm and normal heart sounds.  No murmur heard. No BLE edema. Pulmonary/Chest: Effort normal and breath sounds normal. No respiratory distress. Abdominal: Soft.  There is no tenderness. Psychiatric: Patient has a normal mood and affect. behavior is normal. Judgment and thought content normal. Muscular Skeletal: slow gait, uses a cane, mild right knee  effusion and is stable.   Recent Results (from the past 2160 hour(s))  POCT HgB A1C     Status: Normal   Collection Time: 02/08/18  8:51 AM  Result Value Ref Range   Hemoglobin A1C  4.0 - 5.6 %   HbA1c POC (<> result, manual entry)  4.0 - 5.6 %   HbA1c, POC (prediabetic range)  5.7 - 6.4 %   HbA1c, POC (controlled diabetic range) 5.7 0.0 - 7.0 %      PHQ2/9: Depression screen [facility] 2/9 02/08/2018 09/11/2017 04/08/2017 11/27/2016 07/28/2016  Decreased Interest 1 0 0 0 0  Down, Depressed, Hopeless 0 0 0 0 0  PHQ - 2 Score 1 0 0 0 0  Altered sleeping 0 0 - - -  Tired, decreased energy 1 0 - - -  Change in appetite 0 0 - - -  Feeling bad or failure about yourself  0 0 - - -  Trouble concentrating 0 0 - - -  Moving slowly or fidgety/restless 0 0 - - -  Suicidal thoughts 0 0 - - -  PHQ-9 Score 2 0 - - -  Difficult doing work/chores Not difficult at all Not difficult at all - - -     Fall Risk: Fall Risk  02/08/2018 09/11/2017 04/08/2017 11/27/2016 07/28/2016  Falls in the past year? No No No Yes Yes  Number falls in past yr: - - - 1 1  Comment - - - Christmas Eve -  Injury with Fall? - - - Yes Yes   Comment - - - Broken some ribs Fall in [place] on Dec. 24 and fracture Ribs   Risk for fall due to : - Impaired balance/gait;History of fall(s);Impaired vision - - -  Risk for fall due to: Comment - pain R knee; walks with cane; wears eyeglasses - - -     Functional Status Survey: Is the patient deaf or have difficulty hearing?: No Does the patient have difficulty seeing, even when wearing glasses/contacts?: Yes Does the patient have difficulty concentrating, remembering, or making decisions?: Yes(remembering a little bit) Does the patient have difficulty walking or climbing stairs?: Yes Does the patient have difficulty dressing or bathing?: No Does the patient have difficulty doing errands alone such as visiting a doctor's office or shopping?: Yes(Has someone who helps bring her to appointments)    Assessment & Plan   1. Type 2 diabetes mellitus with stage 3 chronic kidney disease, without long-term current use of insulin (HCC)  - POCT HgB A1C - POCT UA - Microalbumin - telmisartan (MICARDIS) 40 MG tablet; Take 1 tablet (40 mg total) by mouth daily.  Dispense: 90 tablet; Refill: 1  2. Chronic kidney insufficiency, stage 3 (moderate) (HCC)  Stable, continue follow up with nephrologist   3. Benign hypertension  - telmisartan (MICARDIS) 40 MG tablet; Take 1 tablet (40 mg total) by mouth daily.  Dispense: 90 tablet; Refill: 1  4. Chronic venous insufficiency  stable  5. Depression, major, recurrent, in remission (HCC)  Doing well   6. Dyslipidemia  - pravastatin (PRAVACHOL) 40 MG tablet; TAKE 1 TABLET (40 MG TOTAL) BY MOUTH DAILY.  Dispense: 90 tablet; Refill: 1  7. Anemia of chronic disease   Followed by Dr. Thedore Mins   8. Varicose veins of leg with pain, right  Stable   9. Primary osteoarthritis of both knees  Stable, taking tylenol prn

## 2018-03-29 DIAGNOSIS — I10 Essential (primary) hypertension: Secondary | ICD-10-CM | POA: Diagnosis not present

## 2018-03-29 DIAGNOSIS — N183 Chronic kidney disease, stage 3 (moderate): Secondary | ICD-10-CM | POA: Diagnosis not present

## 2018-03-29 DIAGNOSIS — R600 Localized edema: Secondary | ICD-10-CM | POA: Diagnosis not present

## 2018-03-30 LAB — BASIC METABOLIC PANEL
BUN: 26 — AB (ref 4–21)
Creatinine: 1.1 (ref 0.5–1.1)
GLUCOSE: 98
Potassium: 3.9 (ref 3.4–5.3)
Sodium: 143 (ref 137–147)

## 2018-03-30 LAB — CBC AND DIFFERENTIAL
HCT: 34 — AB (ref 36–46)
Hemoglobin: 10.7 — AB (ref 12.0–16.0)
NEUTROS ABS: 2584
PLATELETS: 243 (ref 150–399)
WBC: 4.3

## 2018-04-01 ENCOUNTER — Encounter: Payer: Self-pay | Admitting: Family Medicine

## 2018-04-05 ENCOUNTER — Encounter: Payer: Self-pay | Admitting: Family Medicine

## 2018-06-07 ENCOUNTER — Ambulatory Visit (INDEPENDENT_AMBULATORY_CARE_PROVIDER_SITE_OTHER): Payer: Medicare Other

## 2018-06-07 DIAGNOSIS — Z23 Encounter for immunization: Secondary | ICD-10-CM | POA: Diagnosis not present

## 2018-08-02 DIAGNOSIS — I129 Hypertensive chronic kidney disease with stage 1 through stage 4 chronic kidney disease, or unspecified chronic kidney disease: Secondary | ICD-10-CM | POA: Diagnosis not present

## 2018-08-02 DIAGNOSIS — D649 Anemia, unspecified: Secondary | ICD-10-CM | POA: Diagnosis not present

## 2018-08-02 DIAGNOSIS — R600 Localized edema: Secondary | ICD-10-CM | POA: Diagnosis not present

## 2018-08-02 DIAGNOSIS — N183 Chronic kidney disease, stage 3 (moderate): Secondary | ICD-10-CM | POA: Diagnosis not present

## 2018-08-16 ENCOUNTER — Encounter: Payer: Self-pay | Admitting: Family Medicine

## 2018-08-16 ENCOUNTER — Ambulatory Visit (INDEPENDENT_AMBULATORY_CARE_PROVIDER_SITE_OTHER): Payer: Medicare Other | Admitting: Family Medicine

## 2018-08-16 VITALS — BP 142/90 | HR 83 | Temp 98.0°F | Resp 16 | Ht 66.0 in | Wt 147.9 lb

## 2018-08-16 DIAGNOSIS — R8290 Unspecified abnormal findings in urine: Secondary | ICD-10-CM | POA: Diagnosis not present

## 2018-08-16 DIAGNOSIS — M170 Bilateral primary osteoarthritis of knee: Secondary | ICD-10-CM

## 2018-08-16 DIAGNOSIS — L84 Corns and callosities: Secondary | ICD-10-CM

## 2018-08-16 DIAGNOSIS — I872 Venous insufficiency (chronic) (peripheral): Secondary | ICD-10-CM | POA: Diagnosis not present

## 2018-08-16 DIAGNOSIS — N183 Chronic kidney disease, stage 3 unspecified: Secondary | ICD-10-CM

## 2018-08-16 DIAGNOSIS — E785 Hyperlipidemia, unspecified: Secondary | ICD-10-CM | POA: Diagnosis not present

## 2018-08-16 DIAGNOSIS — I10 Essential (primary) hypertension: Secondary | ICD-10-CM | POA: Diagnosis not present

## 2018-08-16 DIAGNOSIS — R600 Localized edema: Secondary | ICD-10-CM | POA: Diagnosis not present

## 2018-08-16 DIAGNOSIS — E1122 Type 2 diabetes mellitus with diabetic chronic kidney disease: Secondary | ICD-10-CM

## 2018-08-16 DIAGNOSIS — F3340 Major depressive disorder, recurrent, in remission, unspecified: Secondary | ICD-10-CM

## 2018-08-16 DIAGNOSIS — D649 Anemia, unspecified: Secondary | ICD-10-CM | POA: Diagnosis not present

## 2018-08-16 DIAGNOSIS — N390 Urinary tract infection, site not specified: Secondary | ICD-10-CM | POA: Diagnosis not present

## 2018-08-16 LAB — HEPATIC FUNCTION PANEL
AG RATIO: 1.6 (calc) (ref 1.0–2.5)
ALKALINE PHOSPHATASE (APISO): 75 U/L (ref 37–153)
ALT: 13 U/L (ref 6–29)
AST: 23 U/L (ref 10–35)
Albumin: 4.4 g/dL (ref 3.6–5.1)
BILIRUBIN DIRECT: 0.1 mg/dL (ref 0.0–0.2)
BILIRUBIN INDIRECT: 0.4 mg/dL (ref 0.2–1.2)
BILIRUBIN TOTAL: 0.5 mg/dL (ref 0.2–1.2)
Globulin: 2.8 g/dL (calc) (ref 1.9–3.7)
Total Protein: 7.2 g/dL (ref 6.1–8.1)

## 2018-08-16 LAB — LIPID PANEL
CHOLESTEROL: 208 mg/dL — AB (ref <200)
HDL: 100 mg/dL (ref >=50)
LDL Cholesterol (Calc): 96 mg/dL (calc)
Non-HDL Cholesterol (Calc): 108 mg/dL (calc) (ref <130)
TRIGLYCERIDES: 40 mg/dL (ref <150)
Total CHOL/HDL Ratio: 2.1 (calc) (ref <5.0)

## 2018-08-16 LAB — POCT GLYCOSYLATED HEMOGLOBIN (HGB A1C): HBA1C, POC (CONTROLLED DIABETIC RANGE): 5.9 % (ref 0.0–7.0)

## 2018-08-16 MED ORDER — PRAVASTATIN SODIUM 40 MG PO TABS: ORAL_TABLET | ORAL | 1 refills | Status: DC

## 2018-08-16 MED ORDER — TELMISARTAN 40 MG PO TABS: 40.0000 mg | ORAL_TABLET | Freq: Every day | ORAL | 1 refills | Status: DC

## 2018-08-16 NOTE — Progress Notes (Signed)
Name: Melanie Gray Matzke   MRN: [id number]    DOB: June 10, 1941   Date:08/16/2018       Progress Note  Subjective  Chief Complaint  Chief Complaint  Patient presents with  . Medication Refill  . Hypertension  . Depression  . Diabetes    HPI  HTN: she is now on Micardis 40 mg , bp at home has been well controlled, but she states usually elevated at doctor's office. Nephrologist advised her to take her bp machine to his office soon. She denies chest pain or palpitation   CIT of 6: could not recall address but otherwise normal, discussed [employer] and cross work puzzles, she is still independent. Pay her bills, lives alone.   Diabetes with renal manifestation: GFR stable, on ARB, she will have urine micro done at nephrologist soon. she denies polyphagia, polydipsia or polyuria. She follows a diabetic diet. HgbA1C is still at goal. Not on medication. Dyslipidemia is also controlled with medication . Doing well.   Hyperlipidemia: taking Pravastatin, denies myalgia , compliant with medication, last LDL was one year ago and we will recheck labs today   Major Depression: She denies being depressed, she states she is an introvert, she isolates herself on purpose. She states she feels fine. In remission   Varicose veins: shestates that compression stocking hoses is helping with pain and also bulging of her varicose veins., she is now wearing support hoses and it feels better     Urge incontinence: she states not going to church because has to void during the service,she does not want to take medication. Unchanged   OA knee: history of surgery, he still uses a cane and walks slowly    Patient Active Problem List   Diagnosis Date Noted  . Depression, major, recurrent, in remission (HCC) 08/10/2017  . Varicose veins of both lower extremities with inflammation 12/01/2016  . Chronic venous insufficiency 12/01/2016  . Pain in limb 12/01/2016  . Benign hypertension 11/06/2014  . Chronic anemia  11/06/2014  . Seborrheic dermatitis 11/06/2014  . Diabetes mellitus with renal manifestation (HCC) 11/06/2014  . Dyslipidemia 11/06/2014  . Cephalalgia 11/06/2014  . Deafness, sensorineural 11/06/2014  . Liver regeneration 11/06/2014  . Arthritis of knee, degenerative 11/06/2014  . Panic attack 11/06/2014  . Restless legs syndrome 11/06/2014  . Allergic rhinitis 11/06/2014  . Spinal stenosis 11/06/2014  . Osteopenia 05/09/2009  . Chronic kidney disease (CKD), stage III (moderate) (HCC) 04/20/2008    Past Surgical History:  Procedure Laterality Date  . ABDOMINAL HYSTERECTOMY    . CATARACT EXTRACTION W/PHACO Left 07/16/2017   Procedure: CATARACT EXTRACTION PHACO AND INTRAOCULAR LENS PLACEMENT (IOC);  Surgeon: [place] CraneKing, Bradley Mark, MD;  Location: [facility] ORS;  Service: Ophthalmology;  Laterality: Left;  US 01:27.0 AP% 21.0 CDE 18.28 Fluid Pack Lot # [id number]  . CHOLECYSTECTOMY    . HERNIA REPAIR      Family History  Problem Relation Age of Onset  . Cancer Mother   . Diabetes Father   . Cancer Sister   . Breast cancer Neg Hx     Social History   Socioeconomic History  . Marital status: Widowed    Spouse name: [name]  . Number of children: 2  . Years of education: Not on file  . Highest education level: 12th grade  Occupational History    Employer: RETIRED  Social Needs  . Financial resource strain: Not hard at all  . Food insecurity:    Worry: Never true    Inability:  Never true  . Transportation needs:    Medical: No    Non-medical: No  Tobacco Use  . Smoking status: Never Smoker  . Smokeless tobacco: Never Used  . Tobacco comment: smoking cessation materials not required  Substance and Sexual Activity  . Alcohol use: No    Alcohol/week: 0.0 standard drinks  . Drug use: No  . Sexual activity: Never  Lifestyle  . Physical activity:    Days per week: 7 days    Minutes per session: 60 min  . Stress: Not at all  Relationships  . Social connections:    Talks on  phone: Patient refused    Gets together: Patient refused    Attends religious service: Patient refused    Active member of club or organization: Patient refused    Attends meetings of clubs or organizations: Patient refused    Relationship status: Widowed  . Intimate partner violence:    Fear of current or ex partner: No    Emotionally abused: No    Physically abused: No    Forced sexual activity: No  Other Topics Concern  . Not on file  Social History Narrative  . Not on file     Current Outpatient Medications:  .  acetaminophen (TYLENOL) 500 MG tablet, Take 1,000 mg by mouth daily as needed for moderate pain or headache., Disp: , Rfl:  .  aspirin EC 81 MG tablet, Take 81 mg by mouth daily., Disp: , Rfl:  .  Cholecalciferol (VITAMIN D) 2000 UNITS tablet, Take 2,000 Units by mouth every evening. , Disp: , Rfl:  .  Glucosamine-Chondroitin (OSTEO BI-FLEX REGULAR STRENGTH PO), Take 1 tablet by mouth daily., Disp: , Rfl:  .  Multiple Vitamin (MULTI-VITAMINS) TABS, Take 1 tablet by mouth every evening. , Disp: , Rfl:  .  Polyethyl Glycol-Propyl Glycol (SYSTANE OP), Apply 1 drop to eye daily as needed (dry eyes)., Disp: , Rfl:  .  pravastatin (PRAVACHOL) 40 MG tablet, TAKE 1 TABLET (40 MG TOTAL) BY MOUTH DAILY., Disp: 90 tablet, Rfl: 1 .  telmisartan (MICARDIS) 40 MG tablet, Take 1 tablet (40 mg total) by mouth daily., Disp: 90 tablet, Rfl: 1  Allergies  Allergen Reactions  . Sulfa Antibiotics Rash    I personally reviewed active problem list, medication list, allergies, family history, social history with the patient/caregiver today.   ROS  Constitutional: Negative for fever or weight change.  Respiratory: Negative for cough and shortness of breath.   Cardiovascular: Negative for chest pain or palpitations.  Gastrointestinal: Negative for abdominal pain, no bowel changes.  Musculoskeletal: Negative for gait problem or joint swelling.  Skin: Negative for rash.  Neurological:  Negative for dizziness or headache.  No other specific complaints in a complete review of systems (except as listed in HPI above).  Objective  Vitals:   08/16/18 1012 08/16/18 1040  BP: (!) 164/92 (!) 142/90  Pulse: 83   Resp: 16   Temp: 98 F (36.7 C)   TempSrc: Oral   SpO2: 98%   Weight: 147 lb 14.4 oz (67.1 kg)   Height: 5\' 6"  (1.676 m)     Body mass index is 23.87 kg/m.  Physical Exam  Constitutional: Patient appears well-developed and well-nourished.  No distress.  HEENT: head atraumatic, normocephalic, pupils equal and reactive to light,  neck supple, throat within normal limits Cardiovascular: Normal rate, regular rhythm and normal heart sounds.  No murmur heard. No BLE edema. Pulmonary/Chest: Effort normal and breath sounds normal. No respiratory  distress. Abdominal: Soft.  There is no tenderness. Psychiatric: Patient has a normal mood and affect. behavior is normal. Judgment and thought content normal.   Diabetic Foot Exam: Diabetic Foot Exam - Simple   Simple Foot Form Visual Inspection See comments:  Yes Sensation Testing Intact to touch and monofilament testing bilaterally:  Yes Pulse Check Posterior Tibialis and Dorsalis pulse intact bilaterally:  Yes Comments Thick nails, hammer toe and corn formation      PHQ2/9: Depression screen [facility] 2/9 08/16/2018 02/08/2018 09/11/2017 04/08/2017 11/27/2016  Decreased Interest 1 1 0 0 0  Down, Depressed, Hopeless 0 0 0 0 0  PHQ - 2 Score 1 1 0 0 0  Altered sleeping 0 0 0 - -  Tired, decreased energy 1 1 0 - -  Change in appetite 0 0 0 - -  Feeling bad or failure about yourself  0 0 0 - -  Trouble concentrating 0 0 0 - -  Moving slowly or fidgety/restless 0 0 0 - -  Suicidal thoughts 0 0 0 - -  PHQ-9 Score 2 2 0 - -  Difficult doing work/chores Not difficult at all Not difficult at all Not difficult at all - -     Fall Risk: Fall Risk  08/16/2018 02/08/2018 09/11/2017 04/08/2017 11/27/2016  Falls in the past year? 1  No No No Yes  Number falls in past yr: 0 - - - 1  Comment - - - - Christmas Eve  Injury with Fall? 1 - - - Yes  Comment - - - - Broken some ribs  Risk for fall due to : - - Impaired balance/gait;History of fall(s);Impaired vision - -  Risk for fall due to: Comment - - pain R knee; walks with cane; wears eyeglasses - -    Functional Status Survey: Is the patient deaf or have difficulty hearing?: No Does the patient have difficulty seeing, even when wearing glasses/contacts?: Yes Does the patient have difficulty concentrating, remembering, or making decisions?: No Does the patient have difficulty walking or climbing stairs?: Yes Does the patient have difficulty dressing or bathing?: No Does the patient have difficulty doing errands alone such as visiting a doctor's office or shopping?: No    Assessment & Plan  1. Type 2 diabetes mellitus with stage 3 chronic kidney disease, without long-term current use of insulin (HCC)  - POCT HgB A1C - telmisartan (MICARDIS) 40 MG tablet; Take 1 tablet (40 mg total) by mouth daily.  Dispense: 90 tablet; Refill: 1  2. Dyslipidemia  - pravastatin (PRAVACHOL) 40 MG tablet; TAKE 1 TABLET (40 MG TOTAL) BY MOUTH DAILY.  Dispense: 90 tablet; Refill: 1  3. Benign hypertension  - telmisartan (MICARDIS) 40 MG tablet; Take 1 tablet (40 mg total) by mouth daily.  Dispense: 90 tablet; Refill: 1  4. Depression, major, recurrent, in remission (HCC)  Doing well , not on medication   5. Primary osteoarthritis of both knees  Stable  6. Chronic venous insufficiency   7. Chronic kidney insufficiency, stage 3 (moderate) (HCC)  Recently seen by nephrologist and had labs done, not available for review yet  - telmisartan (MICARDIS) 40 MG tablet; Take 1 tablet (40 mg total) by mouth daily.  Dispense: 90 tablet; Refill: 1

## 2018-08-23 ENCOUNTER — Encounter: Payer: Self-pay | Admitting: Podiatry

## 2018-08-23 ENCOUNTER — Ambulatory Visit (INDEPENDENT_AMBULATORY_CARE_PROVIDER_SITE_OTHER): Payer: Medicare Other | Admitting: Podiatry

## 2018-08-23 VITALS — BP 164/91 | HR 76

## 2018-08-23 DIAGNOSIS — L84 Corns and callosities: Secondary | ICD-10-CM | POA: Diagnosis not present

## 2018-08-23 DIAGNOSIS — M79675 Pain in left toe(s): Secondary | ICD-10-CM

## 2018-08-23 DIAGNOSIS — M2042 Other hammer toe(s) (acquired), left foot: Secondary | ICD-10-CM | POA: Diagnosis not present

## 2018-08-23 DIAGNOSIS — E119 Type 2 diabetes mellitus without complications: Secondary | ICD-10-CM | POA: Diagnosis not present

## 2018-08-23 DIAGNOSIS — M79674 Pain in right toe(s): Secondary | ICD-10-CM | POA: Diagnosis not present

## 2018-08-23 DIAGNOSIS — M2041 Other hammer toe(s) (acquired), right foot: Secondary | ICD-10-CM | POA: Diagnosis not present

## 2018-08-23 DIAGNOSIS — B351 Tinea unguium: Secondary | ICD-10-CM | POA: Diagnosis not present

## 2018-08-23 NOTE — Progress Notes (Signed)
This patient presents to the office with chief complaint of long thick nails and diabetic feet.  This patient  says there  is  no pain and discomfort in their feet.  This patient says there are long thick painful nails.  These nails are painful walking and wearing shoes.  Patient has no history of infection or drainage from both feet.  Patient is unable to  self treat his own nails .Patient also has a painful corn on her fourth toe left foot.This patient presents  to the office today for treatment of the  long nails and a foot evaluation due to history of  diabetes.  General Appearance  Alert, conversant and in no acute stress.  Vascular  Dorsalis pedis and posterior tibial  pulses are palpable  bilaterally.  Capillary return is within normal limits  bilaterally. Temperature is within normal limits  bilaterally.  Neurologic  Senn-Weinstein monofilament wire test within normal limits  bilaterally. Muscle power within normal limits bilaterally.  Nails Thick disfigured discolored nails with subungual debris  from hallux to fifth toes bilaterally. No evidence of bacterial infection or drainage bilaterally.  Orthopedic  No limitations of motion of motion feet .  No crepitus or effusions noted.  Hammer toes  B/L.  Skin  normotropic skin with no porokeratosis noted bilaterally.  No signs of infections or ulcers noted.   Corn noted fourth toe left foot.  Asymptomatic corn fifth toe right foot.  Onychomycosis  Diabetes with no foot complications  IE  Debride nails x 10.  A diabetic foot exam was performed and there is no evidence of any vascular or neurologic pathology.  Debride corn left foot.  RTC 3 months.   Helane Gunther DPM

## 2018-08-27 ENCOUNTER — Telehealth: Payer: Self-pay

## 2018-08-27 NOTE — Telephone Encounter (Signed)
Copied from CRM [id number]. Topic: Quick Communication - See Telephone Encounter >> Aug 27, 2018  8:52 AM Aretta Nip wrote: CRM for notification. See Telephone encounter for: 08/27/18. Pt Melanie Gray is wanting a call back concerning her medication. She went to the kidney dr yesterday and BP was 160/91 and he gave her a prescription for Amlodipine. She is already taking Telmisarton from Dr Carlynn Purl. The script is ready but does not want to pick up until she hears from [facility]. Please call and verify with her it is okay at [phone], landline.Telmisartan 40 mg

## 2018-08-28 NOTE — Telephone Encounter (Signed)
She needs to continue Telmisartan and add Amlodipine. Patient notified by phone, advised to start with half for a couple of days and monitor for dizziness, she will call back with dosage on Monday

## 2018-08-31 NOTE — Telephone Encounter (Signed)
Copied from CRM [id number]. Topic: Quick Communication - See Telephone Encounter >> Aug 27, 2018  8:52 AM Aretta Nip wrote: CRM for notification. See Telephone encounter for: 08/27/18. Pt Melanie Gray is wanting a call back concerning her medication. She went to the kidney dr yesterday and BP was 160/91 and he gave her a prescription for Amlodipine. She is already taking Telmisarton from Dr Melanie Gray. The script is ready but does not want to pick up until she hears from [facility]. Please call and verify with her it is okay at [phone], landline.Telmisartan 40 mg >> Aug 31, 2018  3:57 PM Donita Brooks wrote: Pt called today stating she was calling to give the dosage information to Dr. Carlynn Gray. Pt stated that the amlodipine dosage is 5mg  and she wants Dr. Carlynn Gray to call back to verify it is ok to take this medicine.

## 2018-08-31 NOTE — Telephone Encounter (Signed)
Spoke with patient and she is going to start the Amlodipine with her Telmisartan separate times of the day. Keep a log of her BP readings daily for a week and report back to [place] her readings and how she does with the new additional of BP medication.

## 2018-09-17 ENCOUNTER — Ambulatory Visit (INDEPENDENT_AMBULATORY_CARE_PROVIDER_SITE_OTHER): Payer: Medicare Other

## 2018-09-17 VITALS — BP 132/82 | HR 66 | Temp 97.6°F | Resp 16 | Ht 66.0 in | Wt 147.1 lb

## 2018-09-17 DIAGNOSIS — Z0000 Encounter for general adult medical examination without abnormal findings: Secondary | ICD-10-CM

## 2018-09-17 DIAGNOSIS — Z780 Asymptomatic menopausal state: Secondary | ICD-10-CM

## 2018-09-17 DIAGNOSIS — Z1231 Encounter for screening mammogram for malignant neoplasm of breast: Secondary | ICD-10-CM

## 2018-09-17 NOTE — Patient Instructions (Signed)
Melanie Gray , Thank you for taking time to come for your Medicare Wellness Visit. I appreciate your ongoing commitment to your health goals. Please review the following plan we discussed and let me know if I can assist you in the future.   Screening recommendations/referrals: Colonoscopy: done 06/25/09 no longer required Mammogram: done 04/27/17. Please call [phone] to schedule your mammogram and bone density screening.  Bone Density: 11/14/15 Recommended yearly ophthalmology/optometry visit for glaucoma screening and checkup Recommended yearly dental visit for hygiene and checkup  Vaccinations: Influenza vaccine: done 06/07/18 Pneumococcal vaccine: done 03/26/15 Tdap vaccine: due - please contact us if you get a cut or scrape Shingles vaccine: Shingrix discussed. Please contact your pharmacy for coverage information.   Advanced directives: Advance directive discussed with you today. Even though you declined this today please call our office should you change your mind and we can give you the proper paperwork for you to fill out.  Conditions/risks identified: Continue healthy eating and physical activity.  Next appointment: Please follow up in one year for your Medicare Annual Wellness visit.   Preventive Care 80 Years and Older, Female Preventive care refers to lifestyle choices and visits with your health care provider that can promote health and wellness. What does preventive care include?  A yearly physical exam. This is also called an annual well check.  Dental exams once or twice a year.  Routine eye exams. Ask your health care provider how often you should have your eyes checked.  Personal lifestyle choices, including:  Daily care of your teeth and gums.  Regular physical activity.  Eating a healthy diet.  Avoiding tobacco and drug use.  Limiting alcohol use.  Practicing safe sex.  Taking low-dose aspirin every day.  Taking vitamin and mineral supplements as  recommended by your health care provider. What happens during an annual well check? The services and screenings done by your health care provider during your annual well check will depend on your age, overall health, lifestyle risk factors, and family history of disease. Counseling  Your health care provider may ask you questions about your:  Alcohol use.  Tobacco use.  Drug use.  Emotional well-being.  Home and relationship well-being.  Sexual activity.  Eating habits.  History of falls.  Memory and ability to understand (cognition).  Work and work Astronomer.  Reproductive health. Screening  You may have the following tests or measurements:  Height, weight, and BMI.  Blood pressure.  Lipid and cholesterol levels. These may be checked every 5 years, or more frequently if you are over 80 years old.  Skin check.  Lung cancer screening. You may have this screening every year starting at age 60 if you have a 30-pack-year history of smoking and currently smoke or have quit within the past 15 years.  Fecal occult blood test (FOBT) of the stool. You may have this test every year starting at age 80.  Flexible sigmoidoscopy or colonoscopy. You may have a sigmoidoscopy every 5 years or a colonoscopy every 10 years starting at age 80.  Hepatitis C blood test.  Hepatitis B blood test.  Sexually transmitted disease (STD) testing.  Diabetes screening. This is done by checking your blood sugar (glucose) after you have not eaten for a while (fasting). You may have this done every 1-3 years.  Bone density scan. This is done to screen for osteoporosis. You may have this done starting at age 80.  Mammogram. This may be done every 1-2 years. Talk to your  health care provider about how often you should have regular mammograms. Talk with your health care provider about your test results, treatment options, and if necessary, the need for more tests. Vaccines  Your health care  provider may recommend certain vaccines, such as:  Influenza vaccine. This is recommended every year.  Tetanus, diphtheria, and acellular pertussis (Tdap, Td) vaccine. You may need a Td booster every 10 years.  Zoster vaccine. You may need this after age 80.  Pneumococcal 13-valent conjugate (PCV13) vaccine. One dose is recommended after age 80.  Pneumococcal polysaccharide (PPSV23) vaccine. One dose is recommended after age 80. Talk to your health care provider about which screenings and vaccines you need and how often you need them. This information is not intended to replace advice given to you by your health care provider. Make sure you discuss any questions you have with your health care provider. Document Released: 07/27/2015 Document Revised: 03/19/2016 Document Reviewed: 05/01/2015 Elsevier Interactive Patient Education  2017 [place] Prevention in the Home Falls can cause injuries. They can happen to people of all ages. There are many things you can do to make your home safe and to help prevent falls. What can I do on the outside of my home?  Regularly fix the edges of walkways and driveways and fix any cracks.  Remove anything that might make you trip as you walk through a door, such as a raised step or threshold.  Trim any bushes or trees on the path to your home.  Use bright outdoor lighting.  Clear any walking paths of anything that might make someone trip, such as rocks or tools.  Regularly check to see if handrails are loose or broken. Make sure that both sides of any steps have handrails.  Any raised decks and porches should have guardrails on the edges.  Have any leaves, snow, or ice cleared regularly.  Use sand or salt on walking paths during winter.  Clean up any spills in your garage right away. This includes oil or grease spills. What can I do in the bathroom?  Use night lights.  Install grab bars by the toilet and in the tub and shower. Do  not use towel bars as grab bars.  Use non-skid mats or decals in the tub or shower.  If you need to sit down in the shower, use a plastic, non-slip stool.  Keep the floor dry. Clean up any water that spills on the floor as soon as it happens.  Remove soap buildup in the tub or shower regularly.  Attach bath mats securely with double-sided non-slip rug tape.  Do not have throw rugs and other things on the floor that can make you trip. What can I do in the bedroom?  Use night lights.  Make sure that you have a light by your bed that is easy to reach.  Do not use any sheets or blankets that are too big for your bed. They should not hang down onto the floor.  Have a firm chair that has side arms. You can use this for support while you get dressed.  Do not have throw rugs and other things on the floor that can make you trip. What can I do in the kitchen?  Clean up any spills right away.  Avoid walking on wet floors.  Keep items that you use a lot in easy-to-reach places.  If you need to reach something above you, use a strong step stool that has a  grab bar.  Keep electrical cords out of the way.  Do not use floor polish or wax that makes floors slippery. If you must use wax, use non-skid floor wax.  Do not have throw rugs and other things on the floor that can make you trip. What can I do with my stairs?  Do not leave any items on the stairs.  Make sure that there are handrails on both sides of the stairs and use them. Fix handrails that are broken or loose. Make sure that handrails are as long as the stairways.  Check any carpeting to make sure that it is firmly attached to the stairs. Fix any carpet that is loose or worn.  Avoid having throw rugs at the top or bottom of the stairs. If you do have throw rugs, attach them to the floor with carpet tape.  Make sure that you have a light switch at the top of the stairs and the bottom of the stairs. If you do not have them,  ask someone to add them for you. What else can I do to help prevent falls?  Wear shoes that:  Do not have high heels.  Have rubber bottoms.  Are comfortable and fit you well.  Are closed at the toe. Do not wear sandals.  If you use a stepladder:  Make sure that it is fully opened. Do not climb a closed stepladder.  Make sure that both sides of the stepladder are locked into place.  Ask someone to hold it for you, if possible.  Clearly mark and make sure that you can see:  Any grab bars or handrails.  First and last steps.  Where the edge of each step is.  Use tools that help you move around (mobility aids) if they are needed. These include:  Canes.  Walkers.  Scooters.  Crutches.  Turn on the lights when you go into a dark area. Replace any light bulbs as soon as they burn out.  Set up your furniture so you have a clear path. Avoid moving your furniture around.  If any of your floors are uneven, fix them.  If there are any pets around you, be aware of where they are.  Review your medicines with your doctor. Some medicines can make you feel dizzy. This can increase your chance of falling. Ask your doctor what other things that you can do to help prevent falls. This information is not intended to replace advice given to you by your health care provider. Make sure you discuss any questions you have with your health care provider. Document Released: 04/26/2009 Document Revised: 12/06/2015 Document Reviewed: 08/04/2014 Elsevier Interactive Patient Education  2017 [employer].

## 2018-09-17 NOTE — Progress Notes (Signed)
Subjective:   Melanie Gray is a 78 y.o. female who presents for Medicare Annual (Subsequent) preventive examination.  Review of Systems:   Cardiac Risk Factors include: advanced age (>8men, >19 women);diabetes mellitus;hypertension;dyslipidemia     Objective:     Vitals: BP 132/82 (BP Location: Left Arm, Patient Position: Sitting, Cuff Size: Normal)   Pulse 66   Temp 97.6 F (36.4 C) (Oral)   Resp 16   Ht  (1.676 m)   Wt 147 lb 1.6 oz (66.7 kg)   SpO2 99%   BMI 23.74 kg/m   Body mass index is 23.74 kg/m.  Advanced Directives 09/17/2018 09/11/2017 04/08/2017 03/25/2017 12/01/2016 11/27/2016 07/16/2016  Does Patient Have a Medical Advance Directive? No No No No No No No  Would patient like information on creating a medical advance directive? No - Patient declined Yes (MAU/Ambulatory/Procedural Areas - Information given) - - - - -    Tobacco Social History   Tobacco Use  Smoking Status Never Smoker  Smokeless Tobacco Never Used  Tobacco Comment   smoking cessation materials not required     Counseling given: Not Answered Comment: smoking cessation materials not required   Clinical Intake:  Pre-visit preparation completed: Yes  Pain : No/denies pain     BMI - recorded: 23.74 Nutritional Status: BMI of 19-24  Normal Nutritional Risks: None Diabetes: Yes CBG done?: No Did pt. bring in CBG monitor from home?: No   Nutrition Risk Assessment:  Has the patient had any N/V/D within the last 2 months?  No  Does the patient have any non-healing wounds?  No  Has the patient had any unintentional weight loss or weight gain?  No   Diabetes:  Is the patient diabetic?  Yes  If diabetic, was a CBG obtained today?  No  Did the patient bring in their glucometer from home?  No  How often do you monitor your CBG's? Once weekly fasting on Saturday.   Financial Strains and Diabetes Management:  Are you having any financial strains with the device, your supplies or your  medication? No .  Does the patient want to be seen by Chronic Care Management for management of their diabetes?  No  Would the patient like to be referred to a Nutritionist or for Diabetic Management?  No   Diabetic Exams:  Diabetic Eye Exam: Completed 07/23/17 negative retinopathy. Overdue for diabetic eye exam. Pt has been advised about the importance in completing this exam.   Diabetic Foot Exam: Completed 08/16/18.   How often do you need to have someone help you when you read instructions, pamphlets, or other written materials from your doctor or pharmacy?: 1 - Never  Interpreter Needed?: No  Information entered by :: [name] LPN  Past Medical History:  Diagnosis Date  . Chronic kidney disease    RENAL INSUFF  . Complication of anesthesia    HA , N/V  . Diabetes mellitus without complication (HCC)   . Hyperlipidemia   . Hypertension   . Knee instability    RIGHT KNEE WEAKNESS  . PONV (postoperative nausea and vomiting)   . Vertigo    Past Surgical History:  Procedure Laterality Date  . ABDOMINAL HYSTERECTOMY    . CATARACT EXTRACTION W/PHACO Left 07/16/2017   Procedure: CATARACT EXTRACTION PHACO AND INTRAOCULAR LENS PLACEMENT (IOC);  Surgeon: [name], MD;  Location: [facility] ORS;  Service: Ophthalmology;  Laterality: Left;  [place] 01:27.0 AP% 21.0 CDE 18.28 Fluid Pack Lot # [id number]  .  CHOLECYSTECTOMY    . HERNIA REPAIR     Family History  Problem Relation Age of Onset  . Cancer Mother   . Diabetes Father   . Cancer Sister   . Breast cancer Neg Hx    Social History   Socioeconomic History  . Marital status: Widowed    Spouse name: Fayrene Fearing  . Number of children: 2  . Years of education: Not on file  . Highest education level: 12th grade  Occupational History    Employer: RETIRED  Social Needs  . Financial resource strain: Not hard at all  . Food insecurity:    Worry: Never true    Inability: Never true  . Transportation needs:    Medical: No     Non-medical: No  Tobacco Use  . Smoking status: Never Smoker  . Smokeless tobacco: Never Used  . Tobacco comment: smoking cessation materials not required  Substance and Sexual Activity  . Alcohol use: No    Alcohol/week: 0.0 standard drinks  . Drug use: No  . Sexual activity: Not Currently  Lifestyle  . Physical activity:    Days per week: 7 days    Minutes per session: 60 min  . Stress: Only a little  Relationships  . Social connections:    Talks on phone: More than three times a week    Gets together: Three times a week    Attends religious service: Never    Active member of club or organization: No    Attends meetings of clubs or organizations: Never    Relationship status: Widowed  Other Topics Concern  . Not on file  Social History Narrative  . Not on file    Outpatient Encounter Medications as of 09/17/2018  Medication Sig  . acetaminophen (TYLENOL) 500 MG tablet Take 1,000 mg by mouth daily as needed for moderate pain or headache.  Marland Kitchen amLODipine (NORVASC) 5 MG tablet Take 5 mg by mouth daily.  Marland Kitchen aspirin EC 81 MG tablet Take 81 mg by mouth daily.  . Cholecalciferol (VITAMIN D) 2000 UNITS tablet Take 2,000 Units by mouth every evening.   . Glucosamine-Chondroitin (OSTEO BI-FLEX REGULAR STRENGTH PO) Take 1 tablet by mouth daily.  . Multiple Vitamin (MULTI-VITAMINS) TABS Take 1 tablet by mouth every evening.   Bertram Gala Glycol-Propyl Glycol (SYSTANE OP) Apply 1 drop to eye daily as needed (dry eyes).  . pravastatin (PRAVACHOL) 40 MG tablet TAKE 1 TABLET (40 MG TOTAL) BY MOUTH DAILY.  Marland Kitchen telmisartan (MICARDIS) 40 MG tablet Take 1 tablet (40 mg total) by mouth daily.   No facility-administered encounter medications on file as of 09/17/2018.     Activities of Daily Living In your present state of health, do you have any difficulty performing the following activities: 09/17/2018 08/16/2018  Hearing? N N  Comment declines hearing aids -  Vision? N Y  Comment wears glasses -    Difficulty concentrating or making decisions? N N  Comment - -  Walking or climbing stairs? Y Y  Dressing or bathing? N N  Doing errands, shopping? N N  Comment - -  Quarry manager and eating ? N -  Using the Toilet? N -  In the past six months, have you accidently leaked urine? Y -  Comment wears pads for protection -  Do you have problems with loss of bowel control? N -  Managing your Medications? N -  Managing your Finances? N -  Housekeeping or managing your Housekeeping? N -  Some recent data might be hidden    Patient Care Team: Alba Cory, MD as PCP - General (Family Medicine) [name], MD as Consulting Physician (Ophthalmology)    Assessment:   This is a routine wellness examination for [facility].  Exercise Activities and Dietary recommendations Current Exercise Habits: Home exercise routine, Type of exercise: walking;Other - see comments(exercise bike), Time (Minutes): 30, Frequency (Times/Week): 7, Weekly Exercise (Minutes/Week): 210, Intensity: Mild, Exercise limited by: orthopedic condition(s)  Goals    . DIET - INCREASE WATER INTAKE     Recommend to drink at least 6-8 8oz glasses of water per day.       Fall Risk Fall Risk  09/17/2018 08/16/2018 02/08/2018 09/11/2017 04/08/2017  Falls in the past year? 1 1 No No No  Number falls in past yr: 1 0 - - -  Comment tripped over garden hose outside - - - -  Injury with Fall? 0 1 - - -  Comment - - - - -  Risk for fall due to : - - - Impaired balance/gait;History of fall(s);Impaired vision -  Risk for fall due to: Comment - - - pain R knee; walks with cane; wears eyeglasses -  Follow up Falls prevention discussed - - - -   FALL RISK PREVENTION PERTAINING TO THE HOME:  Any stairs in or around the home? Yes  If so, do they handrails? Yes   Home free of loose throw rugs in walkways, pet beds, electrical cords, etc? Yes  Adequate lighting in your home to reduce risk of falls? Yes   ASSISTIVE DEVICES UTILIZED  TO PREVENT FALLS:  Life alert? Yes  Use of a cane, walker or w/c? Yes  Grab bars in the bathroom? No  Shower chair or bench in shower? No  Elevated toilet seat or a handicapped toilet? No   DME ORDERS:  DME order needed?  No   TIMED UP AND GO:  Was the test performed? Yes .  Length of time to ambulate 10 feet: 8 sec.   GAIT:  Appearance of gait: Gait slow, steady and with the use of an assistive device.   Education: Fall risk prevention has been discussed.  Intervention(s) required? No   Depression Screen PHQ 2/9 Scores 09/17/2018 08/16/2018 02/08/2018 09/11/2017  PHQ - 2 Score 0 1 1 0  PHQ- 9 Score - 2 2 0  Exception Documentation - - - -  Not completed - - - -     Cognitive Function     6CIT Screen 09/17/2018 09/11/2017  What Year? 0 points 0 points  What month? 0 points 0 points  What time? 0 points 0 points  Count back from 20 0 points 0 points  Months in reverse 0 points 0 points  Repeat phrase 2 points 6 points  Total Score 2 6    Immunization History  Administered Date(s) Administered  . Influenza Split 03/25/2010  . Influenza, High Dose Seasonal PF 03/26/2015, 03/28/2016, 04/08/2017, 06/07/2018  . Influenza, Seasonal, Injecte, Preservative Fre 04/02/2011, 06/16/2012  . Influenza,inj,Quad PF,6+ Mos 02/24/2014  . Influenza-Unspecified 02/24/2014  . Pneumococcal Conjugate-13 03/26/2015  . Pneumococcal Polysaccharide-23 07/25/2010  . Tdap 07/21/2006  . Zoster 06/24/2013    Qualifies for Shingles Vaccine? Yes  Zostavax completed 2014. Due for Shingrix. Education has been provided regarding the importance of this vaccine. Pt has been advised to call insurance company to determine out of pocket expense. Advised may also receive vaccine at local pharmacy or Health Dept. Verbalized acceptance  and understanding.  Tdap: Although this vaccine is not a covered service during a Wellness Exam, does the patient still wish to receive this vaccine today?  No .  Education has  been provided regarding the importance of this vaccine. Advised may receive this vaccine at local pharmacy or Health Dept. Aware to provide a copy of the vaccination record if obtained from local pharmacy or Health Dept. Verbalized acceptance and understanding.  Flu Vaccine: Up to date  Pneumococcal Vaccine: Up to date   Screening Tests Health Maintenance  Topic Date Due  . OPHTHALMOLOGY EXAM  07/23/2018  . TETANUS/TDAP  07/15/2019 (Originally 07/21/2016)  . HEMOGLOBIN A1C  02/14/2019  . FOOT EXAM  08/17/2019  . INFLUENZA VACCINE  Completed  . DEXA SCAN  Completed  . PNA vac Low Risk Adult  Completed   Cancer Screenings:  Colorectal Screening: Completed 06/25/09. No longer required.   Mammogram: Completed 04/27/17. Repeat every year; Ordered today. Pt provided with contact information and advised to call to schedule appt.   Bone Density: Completed 11/14/15. Results reflect  OSTEOPENIA. Repeat every 2 years. Ordered today. Pt provided with contact information and advised to call to schedule appt.   Lung Cancer Screening: (Low Dose CT Chest recommended if Age 17-80 years, 30 pack-year currently smoking OR have quit w/in 15years.) does not qualify.   Additional Screening:  Hepatitis C Screening: No longer required  Vision Screening: Recommended annual ophthalmology exams for early detection of glaucoma and other disorders of the eye. Is the patient up to date with their annual eye exam?  No  Who is the provider or what is the name of the office in which the pt attends annual eye exams? Dr. Brooke Dare   Dental Screening: Recommended annual dental exams for proper oral hygiene  Community Resource Referral:  CRR required this visit?  No      Plan:     I have personally reviewed and addressed the Medicare Annual Wellness questionnaire and have noted the following in the patient's chart:  A. Medical and social history B. Use of alcohol, tobacco or illicit drugs  C. Current medications  and supplements D. Functional ability and status E.  Nutritional status F.  Physical activity G. Advance directives H. List of other physicians I.  Hospitalizations, surgeries, and ER visits in previous 12 months J.  Vitals K. Screenings such as hearing and vision if needed, cognitive and depression L. Referrals and appointments   In addition, I have reviewed and discussed with patient certain preventive protocols, quality metrics, and best practice recommendations. A written personalized care plan for preventive services as well as general preventive health recommendations were provided to patient.   Signed,  [name], LPN Nurse Health Advisor   Nurse Notes: none

## 2018-11-01 ENCOUNTER — Ambulatory Visit: Payer: Medicare Other | Admitting: Podiatry

## 2019-02-14 ENCOUNTER — Other Ambulatory Visit: Payer: Self-pay

## 2019-02-14 ENCOUNTER — Ambulatory Visit (INDEPENDENT_AMBULATORY_CARE_PROVIDER_SITE_OTHER): Payer: Medicare Other | Admitting: Family Medicine

## 2019-02-14 ENCOUNTER — Encounter: Payer: Self-pay | Admitting: Family Medicine

## 2019-02-14 VITALS — BP 127/80 | Temp 98.2°F | Ht 66.0 in | Wt 144.0 lb

## 2019-02-14 DIAGNOSIS — I129 Hypertensive chronic kidney disease with stage 1 through stage 4 chronic kidney disease, or unspecified chronic kidney disease: Secondary | ICD-10-CM

## 2019-02-14 DIAGNOSIS — I872 Venous insufficiency (chronic) (peripheral): Secondary | ICD-10-CM | POA: Diagnosis not present

## 2019-02-14 DIAGNOSIS — M170 Bilateral primary osteoarthritis of knee: Secondary | ICD-10-CM

## 2019-02-14 DIAGNOSIS — E1122 Type 2 diabetes mellitus with diabetic chronic kidney disease: Secondary | ICD-10-CM

## 2019-02-14 DIAGNOSIS — F3340 Major depressive disorder, recurrent, in remission, unspecified: Secondary | ICD-10-CM

## 2019-02-14 DIAGNOSIS — E785 Hyperlipidemia, unspecified: Secondary | ICD-10-CM | POA: Diagnosis not present

## 2019-02-14 DIAGNOSIS — N183 Chronic kidney disease, stage 3 unspecified: Secondary | ICD-10-CM

## 2019-02-14 DIAGNOSIS — I10 Essential (primary) hypertension: Secondary | ICD-10-CM

## 2019-02-14 MED ORDER — TELMISARTAN 40 MG PO TABS: 40.0000 mg | ORAL_TABLET | Freq: Every day | ORAL | 1 refills | Status: DC

## 2019-02-14 MED ORDER — AMLODIPINE BESYLATE 5 MG PO TABS: 5.0000 mg | ORAL_TABLET | Freq: Every day | ORAL | 0 refills | Status: DC

## 2019-02-14 MED ORDER — PRAVASTATIN SODIUM 40 MG PO TABS: ORAL_TABLET | ORAL | 1 refills | Status: DC

## 2019-02-14 NOTE — Progress Notes (Signed)
Name: Melanie Gray Vallie   MRN: [id number]    DOB: 1941/06/19   Date:02/14/2019       Progress Note  Subjective  Chief Complaint  Chief Complaint  Patient presents with  . Hypertension  . Diabetes  . Depression  . Dyslipidemia    I connected with  Melanie Gray Mayhall on 02/14/19 at 10:40 AM EDT by telephone and verified that I am speaking with the correct person using two identifiers.  I discussed the limitations, risks, security and privacy concerns of performing an evaluation and management service by telephone and the availability of in person appointments. Staff also discussed with the patient that there may be a patient responsible charge related to this service. Patient Location: at ome [facility]   HPI  HTN: she is now onMicardis 40 mg , bp at home has been well controlled, int he 120's range , sometimes it drops to the 90's but no dizziness.  She denies chest pain or palpitation   CIT of 6: could not recall address but otherwise normal, discussed [place] and cross work puzzles, she is still independent. Pay her bills, lives alone, reads her paper every night   Diabetes with renal manifestation: GFR stable, on ARB, she will have urine micro done at nephrologist soon. she denies polyphagia, polydipsia or polyuria. She follows a diabetic diet. HgbA1C is still at goal. Glucose at home is checked weekly and between 90's-130's    Dyslipidemia is also controlled with medication.   Hyperlipidemia: taking Pravastatin, denies myalgia , compliant with medication, last LDL was one year ago and we will recheck labs today  Major Depression: She denies being depressed, she states she is an introvert, she is socializing over the phone, self isolating because of COVId-19 but not feeling lonely  Varicose veins:  she is stopped wearing compression stocking hoses because they were too tight, so she is now wearing knee highs and it works for her  Urge incontinence:  she has been she does not want to take medication, she has been isolating herself since COVID-19 and not worried about it   OA knee: history of surgery, she still uses a cane and walks slowly, she is walking to the end of her drive way daily to get her newspaper    Patient Active Problem List   Diagnosis Date Noted  . Depression, major, recurrent, in remission (HCC) 08/10/2017  . Varicose veins of both lower extremities with inflammation 12/01/2016  . Chronic venous insufficiency 12/01/2016  . Pain in limb 12/01/2016  . Benign hypertension 11/06/2014  . Chronic anemia 11/06/2014  . Seborrheic dermatitis 11/06/2014  . Diabetes mellitus with renal manifestation (HCC) 11/06/2014  . Dyslipidemia 11/06/2014  . Cephalalgia 11/06/2014  . Deafness, sensorineural 11/06/2014  . Liver regeneration 11/06/2014  . Arthritis of knee, degenerative 11/06/2014  . Panic attack 11/06/2014  . Restless legs syndrome 11/06/2014  . Allergic rhinitis 11/06/2014  . Spinal stenosis 11/06/2014  . Osteopenia 05/09/2009  . Chronic kidney disease (CKD), stage III (moderate) (HCC) 04/20/2008    Past Surgical History:  Procedure Laterality Date  . ABDOMINAL HYSTERECTOMY    . CATARACT EXTRACTION W/PHACO Left 07/16/2017   Procedure: CATARACT EXTRACTION PHACO AND INTRAOCULAR LENS PLACEMENT (IOC);  Surgeon: Nevada CraneKing, Bradley Mark, MD;  Location: [facility] ORS;  Service: Ophthalmology;  Laterality: Left;  US 01:27.0 AP% 21.0 CDE 18.28 Fluid Pack Lot # [id number]  . CHOLECYSTECTOMY    . HERNIA REPAIR      Family History  Problem  Relation Age of Onset  . Cancer Mother   . Diabetes Father   . Cancer Sister   . Breast cancer Neg Hx     Social History   Socioeconomic History  . Marital status: Widowed    Spouse name: Jeneen Rinks  . Number of children: 2  . Years of education: Not on file  . Highest education level: 12th grade  Occupational History    Employer: RETIRED  Social Needs  . Financial resource strain: Not  hard at all  . Food insecurity    Worry: Never true    Inability: Never true  . Transportation needs    Medical: No    Non-medical: No  Tobacco Use  . Smoking status: Never Smoker  . Smokeless tobacco: Never Used  . Tobacco comment: smoking cessation materials not required  Substance and Sexual Activity  . Alcohol use: No    Alcohol/week: 0.0 standard drinks  . Drug use: No  . Sexual activity: Not Currently  Lifestyle  . Physical activity    Days per week: 7 days    Minutes per session: 60 min  . Stress: Only a little  Relationships  . Social connections    Talks on phone: More than three times a week    Gets together: Three times a week    Attends religious service: Never    Active member of club or organization: No    Attends meetings of clubs or organizations: Never    Relationship status: Widowed  . Intimate partner violence    Fear of current or ex partner: No    Emotionally abused: No    Physically abused: No    Forced sexual activity: No  Other Topics Concern  . Not on file  Social History Narrative  . Not on file     Current Outpatient Medications:  .  acetaminophen (TYLENOL) 500 MG tablet, Take 1,000 mg by mouth daily as needed for moderate pain or headache., Disp: , Rfl:  .  amLODipine (NORVASC) 5 MG tablet, Take 5 mg by mouth daily., Disp: , Rfl:  .  aspirin EC 81 MG tablet, Take 81 mg by mouth daily., Disp: , Rfl:  .  Cholecalciferol (VITAMIN D) 2000 UNITS tablet, Take 2,000 Units by mouth every evening. , Disp: , Rfl:  .  Glucosamine-Chondroitin (OSTEO BI-FLEX REGULAR STRENGTH PO), Take 1 tablet by mouth daily., Disp: , Rfl:  .  Multiple Vitamin (MULTI-VITAMINS) TABS, Take 1 tablet by mouth every evening. , Disp: , Rfl:  .  Polyethyl Glycol-Propyl Glycol (SYSTANE OP), Apply 1 drop to eye daily as needed (dry eyes)., Disp: , Rfl:  .  pravastatin (PRAVACHOL) 40 MG tablet, TAKE 1 TABLET (40 MG TOTAL) BY MOUTH DAILY., Disp: 90 tablet, Rfl: 1 .  telmisartan  (MICARDIS) 40 MG tablet, Take 1 tablet (40 mg total) by mouth daily., Disp: 90 tablet, Rfl: 1  Allergies  Allergen Reactions  . Sulfa Antibiotics Rash    I personally reviewed active problem list, medication list, allergies, family history, social history with the patient/caregiver today.   ROS  Ten systems reviewed and is negative except as mentioned in HPI   Objective  Virtual encounter, vitals not obtained. Vitals:   02/14/19 0836  Temp: 98.2 F (36.8 C)  TempSrc: Oral  Weight: 144 lb (65.3 kg)  Height: 5\' 6"  (1.676 m)    Body mass index is 23.24 kg/m.  Physical Exam  Awake, alert and oriented   PHQ2/9: Depression screen [facility] 2/9  02/14/2019 09/17/2018 08/16/2018 02/08/2018 09/11/2017  Decreased Interest 0 0 1 1 0  Down, Depressed, Hopeless 0 0 0 0 0  PHQ - 2 Score 0 0 1 1 0  Altered sleeping 0 - 0 0 0  Tired, decreased energy 0 - 1 1 0  Change in appetite 0 - 0 0 0  Feeling bad or failure about yourself  0 - 0 0 0  Trouble concentrating 0 - 0 0 0  Moving slowly or fidgety/restless 0 - 0 0 0  Suicidal thoughts 0 - 0 0 0  PHQ-9 Score 0 - 2 2 0  Difficult doing work/chores - - Not difficult at all Not difficult at all Not difficult at all   PHQ-2/9 Result is negative.    Fall Risk: Fall Risk  02/14/2019 09/17/2018 08/16/2018 02/08/2018 09/11/2017  Falls in the past year? 0 1 1 No No  Number falls in past yr: 0 1 0 - -  Comment - tripped over garden hose outside - - -  Injury with Fall? 0 0 1 - -  Comment - - - - -  Risk for fall due to : - - - - Impaired balance/gait;History of fall(s);Impaired vision  Risk for fall due to: Comment - - - - pain R knee; walks with cane; wears eyeglasses  Follow up - Falls prevention discussed - - -     Assessment & Plan   1. Benign hypertension  - telmisartan (MICARDIS) 40 MG tablet; Take 1 tablet (40 mg total) by mouth daily.  Dispense: 90 tablet; Refill: 1 - amLODipine (NORVASC) 5 MG tablet; Take 1 tablet (5 mg total) by mouth  daily.  Dispense: 90 tablet; Refill: 0  2. Depression, major, recurrent, in remission [facility](HCC)  Doing well, self isolating, but has family and friends checking on her, not feeling lonely   3. Primary osteoarthritis of both knees  stable  4. Dyslipidemia  - pravastatin (PRAVACHOL) 40 MG tablet; TAKE 1 TABLET (40 MG TOTAL) BY MOUTH DAILY.  Dispense: 90 tablet; Refill: 1  5. Type 2 diabetes mellitus with stage 3 chronic kidney disease, without long-term current use of insulin (HCC)  - telmisartan (MICARDIS) 40 MG tablet; Take 1 tablet (40 mg total) by mouth daily.  Dispense: 90 tablet; Refill: 1  6. Chronic kidney insufficiency, stage 3 (moderate) (HCC)  - telmisartan (MICARDIS) 40 MG tablet; Take 1 tablet (40 mg total) by mouth daily.  Dispense: 90 tablet; Refill: 1  7. Chronic venous insufficiency   I discussed the assessment and treatment plan with the patient. The patient was provided an opportunity to ask questions and all were answered. The patient agreed with the plan and demonstrated an understanding of the instructions.   The patient was advised to call back or seek an in-person evaluation if the symptoms worsen or if the condition fails to improve as anticipated.  I provided 25 minutes of non-face-to-face time during this encounter.  Ruel FavorsKrichna F Emanuel Campos, MD

## 2019-03-28 ENCOUNTER — Other Ambulatory Visit: Payer: Self-pay

## 2019-03-28 ENCOUNTER — Ambulatory Visit (INDEPENDENT_AMBULATORY_CARE_PROVIDER_SITE_OTHER): Payer: Medicare Other | Admitting: Family Medicine

## 2019-03-28 DIAGNOSIS — E1122 Type 2 diabetes mellitus with diabetic chronic kidney disease: Secondary | ICD-10-CM

## 2019-03-28 DIAGNOSIS — N183 Chronic kidney disease, stage 3 (moderate): Secondary | ICD-10-CM

## 2019-03-28 DIAGNOSIS — Z23 Encounter for immunization: Secondary | ICD-10-CM | POA: Diagnosis not present

## 2019-03-28 LAB — POCT GLYCOSYLATED HEMOGLOBIN (HGB A1C): Hemoglobin A1C: 5.8 % — AB (ref 4.0–5.6)

## 2019-03-30 NOTE — Progress Notes (Signed)
For flu and A1C only

## 2019-04-18 ENCOUNTER — Ambulatory Visit (INDEPENDENT_AMBULATORY_CARE_PROVIDER_SITE_OTHER): Payer: Medicare Other | Admitting: Family Medicine

## 2019-04-18 ENCOUNTER — Encounter: Payer: Self-pay | Admitting: Family Medicine

## 2019-04-18 ENCOUNTER — Other Ambulatory Visit: Payer: Self-pay

## 2019-04-18 VITALS — BP 122/74 | Temp 97.9°F | Wt 146.0 lb

## 2019-04-18 DIAGNOSIS — I10 Essential (primary) hypertension: Secondary | ICD-10-CM | POA: Diagnosis not present

## 2019-04-18 DIAGNOSIS — N3941 Urge incontinence: Secondary | ICD-10-CM

## 2019-04-18 DIAGNOSIS — N1831 Chronic kidney disease, stage 3a: Secondary | ICD-10-CM | POA: Diagnosis not present

## 2019-04-18 DIAGNOSIS — E1121 Type 2 diabetes mellitus with diabetic nephropathy: Secondary | ICD-10-CM | POA: Diagnosis not present

## 2019-04-18 DIAGNOSIS — I872 Venous insufficiency (chronic) (peripheral): Secondary | ICD-10-CM

## 2019-04-18 DIAGNOSIS — E1122 Type 2 diabetes mellitus with diabetic chronic kidney disease: Secondary | ICD-10-CM

## 2019-04-18 DIAGNOSIS — M170 Bilateral primary osteoarthritis of knee: Secondary | ICD-10-CM

## 2019-04-18 DIAGNOSIS — F3340 Major depressive disorder, recurrent, in remission, unspecified: Secondary | ICD-10-CM | POA: Diagnosis not present

## 2019-04-18 MED ORDER — AMLODIPINE BESYLATE 5 MG PO TABS: 5.0000 mg | ORAL_TABLET | Freq: Every day | ORAL | 0 refills | Status: DC

## 2019-04-18 NOTE — Progress Notes (Signed)
Name: Melanie Gray   MRN: [id number]    DOB: 07-Dec-1940   Date:04/18/2019       Progress Note  Subjective  Chief Complaint  Chief Complaint  Patient presents with  . Diabetes  . Hypertension  . Dyslipidemia    I connected with  Melanie Gray on 04/18/19 at 11:00 AM EDT by telephone and verified that I am speaking with the correct person using two identifiers.  I discussed the limitations, risks, security and privacy concerns of performing an evaluation and management service by telephone and the availability of in person appointments. Staff also discussed with the patient that there may be a patient responsible charge related to this service. Patient Location: at home  [facility]  HPI  HTN: she is now onMicardis 40 mg  She denies chest pain, no dizziness  or palpitation. BP has been well controlled in the range 120's/70's  CIT of 6: could not recall address but otherwise normal, she is working on word search daily. Still independent . She still  pays her own bills,  reads her paper every night . She sates daughter calls her every night  Diabetes with renal manifestation: GFR stable, on ARB, she sees a nephrologist.she denies polyphagia, polydipsia or polyuria. She follows a diabetic diet. HgbA1C is still at goal, last level 5.8%  Glucose at home is checked weekly and between 90's-130's Today's glucose was 104   Hyperlipidemia: taking Pravastatin, denies myalgia , compliant with medication, reviewed labs with patient, last LDL was 96  Major Depression: She denies being depressed, she states she is an introvert, she is socializing over the phone, self isolating because of COVId-19 but not feeling lonely, she states her neighbors keep an eye on her   Varicose veins:  she is stopped wearing compression stocking hoses because they were too tight, so she is now wearing knee highs and it works for her. Unchanged   Urge incontinence: she has been she  does not want to take medication, she has been isolating herself since COVID-19 and not worried about it . Unchanged   OA knee: history of surgery, she still uses a caneand walks slowly, she is walking to the end of her drive way daily to get her newspaper . She denies any increase in pain   Patient Active Problem List   Diagnosis Date Noted  . Depression, major, recurrent, in remission (HCC) 08/10/2017  . Varicose veins of both lower extremities with inflammation 12/01/2016  . Chronic venous insufficiency 12/01/2016  . Pain in limb 12/01/2016  . Benign hypertension 11/06/2014  . Chronic anemia 11/06/2014  . Seborrheic dermatitis 11/06/2014  . Diabetes mellitus with renal manifestation (HCC) 11/06/2014  . Dyslipidemia 11/06/2014  . Cephalalgia 11/06/2014  . Deafness, sensorineural 11/06/2014  . Liver regeneration 11/06/2014  . Arthritis of knee, degenerative 11/06/2014  . Panic attack 11/06/2014  . Restless legs syndrome 11/06/2014  . Allergic rhinitis 11/06/2014  . Spinal stenosis 11/06/2014  . Osteopenia 05/09/2009  . Chronic kidney disease (CKD), stage III (moderate) 04/20/2008    Past Surgical History:  Procedure Laterality Date  . ABDOMINAL HYSTERECTOMY    . CATARACT EXTRACTION W/PHACO Left 07/16/2017   Procedure: CATARACT EXTRACTION PHACO AND INTRAOCULAR LENS PLACEMENT (IOC);  Surgeon: Nevada CraneKing, Bradley Mark, MD;  Location: [facility] ORS;  Service: Ophthalmology;  Laterality: Left;  US 01:27.0 AP% 21.0 CDE 18.28 Fluid Pack Lot # [id number]  . CHOLECYSTECTOMY    . HERNIA REPAIR      Family  History  Problem Relation Age of Onset  . Cancer Mother   . Diabetes Father   . Cancer Sister   . Breast cancer Neg Hx     Social History   Socioeconomic History  . Marital status: Widowed    Spouse name: Fayrene Fearing  . Number of children: 2  . Years of education: Not on file  . Highest education level: 12th grade  Occupational History    Employer: RETIRED  Social Needs  . Financial  resource strain: Not hard at all  . Food insecurity    Worry: Never true    Inability: Never true  . Transportation needs    Medical: No    Non-medical: No  Tobacco Use  . Smoking status: Never Smoker  . Smokeless tobacco: Never Used  . Tobacco comment: smoking cessation materials not required  Substance and Sexual Activity  . Alcohol use: No    Alcohol/week: 0.0 standard drinks  . Drug use: No  . Sexual activity: Not Currently  Lifestyle  . Physical activity    Days per week: 7 days    Minutes per session: 60 min  . Stress: Only a little  Relationships  . Social connections    Talks on phone: More than three times a week    Gets together: Three times a week    Attends religious service: Never    Active member of club or organization: No    Attends meetings of clubs or organizations: Never    Relationship status: Widowed  . Intimate partner violence    Fear of current or ex partner: No    Emotionally abused: No    Physically abused: No    Forced sexual activity: No  Other Topics Concern  . Not on file  Social History Narrative  . Not on file     Current Outpatient Medications:  .  acetaminophen (TYLENOL) 500 MG tablet, Take 1,000 mg by mouth daily as needed for moderate pain or headache., Disp: , Rfl:  .  amLODipine (NORVASC) 5 MG tablet, Take 1 tablet (5 mg total) by mouth daily., Disp: 90 tablet, Rfl: 0 .  aspirin EC 81 MG tablet, Take 81 mg by mouth daily., Disp: , Rfl:  .  Cholecalciferol (VITAMIN D) 2000 UNITS tablet, Take 2,000 Units by mouth every evening. , Disp: , Rfl:  .  Glucosamine-Chondroitin (OSTEO BI-FLEX REGULAR STRENGTH PO), Take 1 tablet by mouth daily., Disp: , Rfl:  .  Multiple Vitamin (MULTI-VITAMINS) TABS, Take 1 tablet by mouth every evening. , Disp: , Rfl:  .  Polyethyl Glycol-Propyl Glycol (SYSTANE OP), Apply 1 drop to eye daily as needed (dry eyes)., Disp: , Rfl:  .  pravastatin (PRAVACHOL) 40 MG tablet, TAKE 1 TABLET (40 MG TOTAL) BY MOUTH  DAILY., Disp: 90 tablet, Rfl: 1 .  telmisartan (MICARDIS) 40 MG tablet, Take 1 tablet (40 mg total) by mouth daily., Disp: 90 tablet, Rfl: 1  Allergies  Allergen Reactions  . Sulfa Antibiotics Rash    I personally reviewed active problem list, medication list, allergies, family history, social history, health maintenance with the patient/caregiver today.   ROS  Ten systems reviewed and is negative except as mentioned in HPI    Objective  Virtual encounter, vitals  Obtained at home  Vitals:   04/18/19 1228  BP: 122/74  Temp: 97.9 F (36.6 C)    There is no height or weight on file to calculate BMI.  Physical Exam  Awake, alert and oriented  PHQ2/9: Depression screen [facility] 2/9 04/18/2019 02/14/2019 09/17/2018 08/16/2018 02/08/2018  Decreased Interest 0 0 0 1 1  Down, Depressed, Hopeless 0 0 0 0 0  PHQ - 2 Score 0 0 0 1 1  Altered sleeping 0 0 - 0 0  Tired, decreased energy 0 0 - 1 1  Change in appetite 0 0 - 0 0  Feeling bad or failure about yourself  0 0 - 0 0  Trouble concentrating 0 0 - 0 0  Moving slowly or fidgety/restless 0 0 - 0 0  Suicidal thoughts 0 0 - 0 0  PHQ-9 Score 0 0 - 2 2  Difficult doing work/chores - - - Not difficult at all Not difficult at all   PHQ-2/9 Result is negative.    Fall Risk: Fall Risk  04/18/2019 02/14/2019 09/17/2018 08/16/2018 02/08/2018  Falls in the past year? 0 0 1 1 No  Number falls in past yr: 0 0 1 0 -  Comment - - tripped over garden hose outside - -  Injury with Fall? 0 0 0 1 -  Comment - - - - -  Risk for fall due to : - - - - -  Risk for fall due to: Comment - - - - -  Follow up - - Falls prevention discussed - -     Assessment & Plan  1. Benign hypertension  At goal - amLODipine (NORVASC) 5 MG tablet; Take 1 tablet (5 mg total) by mouth daily.  Dispense: 90 tablet; Refill: 0  2. Primary osteoarthritis of both knees  Doing well   3. Chronic venous insufficiency   4. Urge incontinence of urine  Stable   5.  Depression, major, recurrent, in remission ([place])  Doing well   6. Type 2 diabetes mellitus with stage 3a chronic kidney disease, without long-term current use of insulin (HCC)  On life style modification only and A1C is at goal   I discussed the assessment and treatment plan with the patient. The patient was provided an opportunity to ask questions and all were answered. The patient agreed with the plan and demonstrated an understanding of the instructions.   The patient was advised to call back or seek an in-person evaluation if the symptoms worsen or if the condition fails to improve as anticipated.  I provided 25  minutes of non-face-to-face time during this encounter.  Loistine Chance, MD

## 2019-07-18 ENCOUNTER — Other Ambulatory Visit: Payer: Self-pay | Admitting: Family Medicine

## 2019-07-18 DIAGNOSIS — I10 Essential (primary) hypertension: Secondary | ICD-10-CM

## 2019-08-22 ENCOUNTER — Ambulatory Visit (INDEPENDENT_AMBULATORY_CARE_PROVIDER_SITE_OTHER): Payer: Medicare Other | Admitting: Family Medicine

## 2019-08-22 ENCOUNTER — Encounter: Payer: Self-pay | Admitting: Family Medicine

## 2019-08-22 ENCOUNTER — Other Ambulatory Visit: Payer: Self-pay

## 2019-08-22 VITALS — BP 136/80 | HR 93 | Temp 97.3°F | Resp 16 | Ht 66.0 in | Wt 148.9 lb

## 2019-08-22 DIAGNOSIS — I872 Venous insufficiency (chronic) (peripheral): Secondary | ICD-10-CM | POA: Diagnosis not present

## 2019-08-22 DIAGNOSIS — F3340 Major depressive disorder, recurrent, in remission, unspecified: Secondary | ICD-10-CM | POA: Diagnosis not present

## 2019-08-22 DIAGNOSIS — N183 Chronic kidney disease, stage 3 unspecified: Secondary | ICD-10-CM

## 2019-08-22 DIAGNOSIS — E1122 Type 2 diabetes mellitus with diabetic chronic kidney disease: Secondary | ICD-10-CM

## 2019-08-22 DIAGNOSIS — E785 Hyperlipidemia, unspecified: Secondary | ICD-10-CM | POA: Diagnosis not present

## 2019-08-22 DIAGNOSIS — M170 Bilateral primary osteoarthritis of knee: Secondary | ICD-10-CM | POA: Diagnosis not present

## 2019-08-22 DIAGNOSIS — D649 Anemia, unspecified: Secondary | ICD-10-CM | POA: Diagnosis not present

## 2019-08-22 DIAGNOSIS — I10 Essential (primary) hypertension: Secondary | ICD-10-CM

## 2019-08-22 DIAGNOSIS — I83811 Varicose veins of right lower extremities with pain: Secondary | ICD-10-CM

## 2019-08-22 DIAGNOSIS — N1831 Chronic kidney disease, stage 3a: Secondary | ICD-10-CM

## 2019-08-22 DIAGNOSIS — E1121 Type 2 diabetes mellitus with diabetic nephropathy: Secondary | ICD-10-CM | POA: Diagnosis not present

## 2019-08-22 DIAGNOSIS — N3941 Urge incontinence: Secondary | ICD-10-CM | POA: Diagnosis not present

## 2019-08-22 LAB — POCT GLYCOSYLATED HEMOGLOBIN (HGB A1C): Hemoglobin A1C: 5.9 % — AB (ref 4.0–5.6)

## 2019-08-22 NOTE — Progress Notes (Signed)
Name: Melanie Gray   MRN: [id number]    DOB: 1940-09-02   Date:08/22/2019       Progress Note  Subjective  Chief Complaint  Chief Complaint  Patient presents with  . Hypertension  . Diabetes  . Dyslipidemia    HPI  HTN: she is now onMicardis 40 mgShe denies chest pain, no dizziness  or palpitation. BP towards low end of normal at home, but she states not dizzy, at our office bp is at goal   Diabetes with renal manifestation: GFR stable, on ARB, she sees a nephrologist, but not seen since pandemic one year ago.she denies polyphagia, polydipsia or polyuria. She follows a diabetic diet. HgbA1C is still at goal, last level 5.8% Glucose at home is checked weekly and between 90's-110's . Today she fell monofilament test   Hyperlipidemia: taking Pravastatin, denies myalgia , compliant with medication, reviewed labs with patient, last LDL was 96, we will recheck labs today   Major Depression: She denies being depressed, she states she is an introvert,she is socializing over the phone, self isolating because of COVID-19 but not feeling lonely, she states her neighbors keep an eye on her.    Varicose veins: she is stopped wearing compression stocking hoses because they were too tight, however she states it helps with pain so she put it on today before she came over.   Urge incontinence: shehas beenshe does not want to take medication, she has been isolating herself since COVID-19 and not worried about it. She has urinary frequency day and night every 1-2 hours.  Discussed medication again but she wants to hold off. No dysuria, odor   OA knee: history of arthroscopic surgery of right kne ,she still uses a caneand walks slowly, she is walking to the end of her drive way daily to get her newspaper. Right knee has mild effusion intermittently    Patient Active Problem List   Diagnosis Date Noted  . Depression, major, recurrent, in remission (HCC) 08/10/2017  . Varicose veins of  both lower extremities with inflammation 12/01/2016  . Chronic venous insufficiency 12/01/2016  . Pain in limb 12/01/2016  . Benign hypertension 11/06/2014  . Chronic anemia 11/06/2014  . Seborrheic dermatitis 11/06/2014  . Diabetes mellitus with renal manifestation (HCC) 11/06/2014  . Dyslipidemia 11/06/2014  . Cephalalgia 11/06/2014  . Deafness, sensorineural 11/06/2014  . Liver regeneration 11/06/2014  . Arthritis of knee, degenerative 11/06/2014  . Panic attack 11/06/2014  . Restless legs syndrome 11/06/2014  . Allergic rhinitis 11/06/2014  . Spinal stenosis 11/06/2014  . Osteopenia 05/09/2009  . Chronic kidney disease (CKD), stage III (moderate) 04/20/2008    Past Surgical History:  Procedure Laterality Date  . ABDOMINAL HYSTERECTOMY    . CATARACT EXTRACTION W/PHACO Left 07/16/2017   Procedure: CATARACT EXTRACTION PHACO AND INTRAOCULAR LENS PLACEMENT (IOC);  Surgeon: [name], MD;  Location: [facility] ORS;  Service: Ophthalmology;  Laterality: Left;  [place] 01:27.0 AP% 21.0 CDE 18.28 Fluid Pack Lot # [id number]  . CHOLECYSTECTOMY    . HERNIA REPAIR      Family History  Problem Relation Age of Onset  . Cancer Mother   . Diabetes Father   . Cancer Sister   . Breast cancer Neg Hx      Current Outpatient Medications:  .  acetaminophen (TYLENOL) 500 MG tablet, Take 1,000 mg by mouth daily as needed for moderate pain or headache., Disp: , Rfl:  .  amLODipine (NORVASC) 5 MG tablet, TAKE 1 TABLET BY MOUTH EVERY  DAY, Disp: 90 tablet, Rfl: 0 .  aspirin EC 81 MG tablet, Take 81 mg by mouth daily., Disp: , Rfl:  .  Cholecalciferol (VITAMIN D) 2000 UNITS tablet, Take 2,000 Units by mouth every evening. , Disp: , Rfl:  .  Glucosamine-Chondroitin (OSTEO BI-FLEX REGULAR STRENGTH PO), Take 1 tablet by mouth daily., Disp: , Rfl:  .  Multiple Vitamin (MULTI-VITAMINS) TABS, Take 1 tablet by mouth every evening. , Disp: , Rfl:  .  Polyethyl Glycol-Propyl Glycol (SYSTANE OP), Apply 1 drop  to eye daily as needed (dry eyes)., Disp: , Rfl:  .  pravastatin (PRAVACHOL) 40 MG tablet, TAKE 1 TABLET (40 MG TOTAL) BY MOUTH DAILY., Disp: 90 tablet, Rfl: 1 .  telmisartan (MICARDIS) 40 MG tablet, Take 1 tablet (40 mg total) by mouth daily., Disp: 90 tablet, Rfl: 1  Allergies  Allergen Reactions  . Sulfa Antibiotics Rash    I personally reviewed active problem list, medication list, allergies, family history, social history with the patient/caregiver today.   ROS  Constitutional: Negative for fever or weight change.  Respiratory: Negative for cough and shortness of breath.   Cardiovascular: Negative for chest pain or palpitations.  Gastrointestinal: Negative for abdominal pain, no bowel changes.  Musculoskeletal: Positive  for gait problem , positive for intermittent right  joint swelling.  Skin: Negative for rash.  Neurological: Negative for dizziness or headache.  No other specific complaints in a complete review of systems (except as listed in HPI above).  Objective  Vitals:   08/22/19 1104 08/22/19 1110  BP: 140/76 136/80  Pulse: 93   Resp: 16   Temp: (!) 97.3 F (36.3 C)   TempSrc: Temporal   SpO2: 99%   Weight: 148 lb 14.4 oz (67.5 kg)   Height: 5\' 6"  (1.676 m)     Body mass index is 24.03 kg/m.  Physical Exam  Constitutional: Patient appears well-developed and well-nourished.  No distress.  HEENT: head atraumatic, normocephalic, pupils equal and reactive to light Cardiovascular: Normal rate, regular rhythm and normal heart sounds.  No murmur heard. No BLE edema. Pulmonary/Chest: Effort normal and breath sounds normal. No respiratory distress. Abdominal: Soft.  There is no tenderness. Psychiatric: Patient has a normal mood and affect. behavior is normal. Judgment and thought content normal.  Recent Results (from the past 2160 hour(s))  POCT HgB A1C     Status: Abnormal   Collection Time: 08/22/19 11:20 AM  Result Value Ref Range   Hemoglobin A1C 5.9 (A)  4.0 - 5.6 %   HbA1c POC (<> result, manual entry)     HbA1c, POC (prediabetic range)     HbA1c, POC (controlled diabetic range)      Diabetic Foot Exam: Diabetic Foot Exam - Simple   Simple Foot Form Diabetic Foot exam was performed with the following findings: Yes 08/22/2019 11:48 AM  Visual Inspection See comments: Yes Sensation Testing See comments: Yes Pulse Check Posterior Tibialis and Dorsalis pulse intact bilaterally: Yes Comments Thick toenails, hammer toes, could not feel monofilament      PHQ2/9: Depression screen [facility] 2/9 08/22/2019 04/18/2019 02/14/2019 09/17/2018 08/16/2018  Decreased Interest 0 0 0 0 1  Down, Depressed, Hopeless 0 0 0 0 0  PHQ - 2 Score 0 0 0 0 1  Altered sleeping 0 0 0 - 0  Tired, decreased energy 0 0 0 - 1  Change in appetite 0 0 0 - 0  Feeling bad or failure about yourself  0 0 0 - 0  Trouble concentrating 0 0 0 - 0  Moving slowly or fidgety/restless 0 0 0 - 0  Suicidal thoughts 0 0 0 - 0  PHQ-9 Score 0 0 0 - 2  Difficult doing work/chores - - - - Not difficult at all    phq 9 is negative  Fall Risk: Fall Risk  08/22/2019 04/18/2019 02/14/2019 09/17/2018 08/16/2018  Falls in the past year? 0 0 0 1 1  Number falls in past yr: 0 0 0 1 0  Comment - - - tripped over garden hose outside -  Injury with Fall? 0 0 0 0 1  Comment - - - - -  Risk for fall due to : - - - - -  Risk for fall due to: Comment - - - - -  Follow up - - - Falls prevention discussed -     Functional Status Survey: Is the patient deaf or have difficulty hearing?: No Does the patient have difficulty seeing, even when wearing glasses/contacts?: No Does the patient have difficulty concentrating, remembering, or making decisions?: No Does the patient have difficulty walking or climbing stairs?: No Does the patient have difficulty dressing or bathing?: No Does the patient have difficulty doing errands alone such as visiting a doctor's office or shopping?: No   Assessment & Plan  1.  Type 2 diabetes mellitus with stage 3a chronic kidney disease, without long-term current use of insulin (HCC)  - POCT HgB A1C - Microalbumin / creatinine urine ratio  2. Benign hypertension  - CBC with Differential/Platelet - COMPLETE METABOLIC PANEL WITH GFR  3. Chronic kidney insufficiency, stage 3 (moderate)  - Microalbumin / creatinine urine ratio - VITAMIN D 25 Hydroxy (Vit-D Deficiency, Fractures) - Parathyroid hormone, intact (no Ca)  4. Anemia, unspecified type  - CBC with Differential/Platelet  5. Dyslipidemia  - Lipid panel  6. Depression, major, recurrent, in remission (HCC)  stable  7. Primary osteoarthritis of both knees  Stable, uses a cane  8. Chronic venous insufficiency  Wearing compression stocking hoses today  9. Urge incontinence of urine  Does not want to take medication  10. Varicose veins of leg with pain, right

## 2019-08-23 LAB — COMPLETE METABOLIC PANEL WITH GFR
AG Ratio: 1.7 (calc) (ref 1.0–2.5)
ALT: 13 U/L (ref 6–29)
AST: 21 U/L (ref 10–35)
Albumin: 4.5 g/dL (ref 3.6–5.1)
Alkaline phosphatase (APISO): 71 U/L (ref 37–153)
BUN/Creatinine Ratio: 20 (calc) (ref 6–22)
BUN: 25 mg/dL (ref 7–25)
CO2: 26 mmol/L (ref 20–32)
Calcium: 10 mg/dL (ref 8.6–10.4)
Chloride: 106 mmol/L (ref 98–110)
Creat: 1.28 mg/dL — ABNORMAL HIGH (ref 0.60–0.88)
GFR, Est African American: 46 mL/min/{1.73_m2} — ABNORMAL LOW (ref >=60)
GFR, Est Non African American: 39 mL/min/{1.73_m2} — ABNORMAL LOW (ref >=60)
Globulin: 2.7 g/dL (calc) (ref 1.9–3.7)
Glucose, Bld: 97 mg/dL (ref 65–99)
Potassium: 4 mmol/L (ref 3.5–5.3)
Sodium: 142 mmol/L (ref 135–146)
Total Bilirubin: 0.5 mg/dL (ref 0.2–1.2)
Total Protein: 7.2 g/dL (ref 6.1–8.1)

## 2019-08-23 LAB — CBC WITH DIFFERENTIAL/PLATELET
Absolute Monocytes: 594 cells/uL (ref 200–950)
Basophils Absolute: 32 cells/uL (ref 0–200)
Basophils Relative: 0.6 %
Eosinophils Absolute: 38 cells/uL (ref 15–500)
Eosinophils Relative: 0.7 %
HCT: 34.6 % — ABNORMAL LOW (ref 35.0–45.0)
Hemoglobin: 11.2 g/dL — ABNORMAL LOW (ref 11.7–15.5)
Lymphs Abs: 1652 cells/uL (ref 850–3900)
MCH: 28.3 pg (ref 27.0–33.0)
MCHC: 32.4 g/dL (ref 32.0–36.0)
MCV: 87.4 fL (ref 80.0–100.0)
MPV: 10.5 fL (ref 7.5–12.5)
Monocytes Relative: 11 %
Neutro Abs: 3083 cells/uL (ref 1500–7800)
Neutrophils Relative %: 57.1 %
Platelets: 250 10*3/uL (ref 140–400)
RBC: 3.96 10*6/uL (ref 3.80–5.10)
RDW: 14.1 % (ref 11.0–15.0)
Total Lymphocyte: 30.6 %
WBC: 5.4 10*3/uL (ref 3.8–10.8)

## 2019-08-23 LAB — MICROALBUMIN / CREATININE URINE RATIO
Creatinine, Urine: 63 mg/dL (ref 20–275)
Microalb Creat Ratio: 24 mcg/mg creat (ref <30)
Microalb, Ur: 1.5 mg/dL

## 2019-08-23 LAB — VITAMIN D 25 HYDROXY (VIT D DEFICIENCY, FRACTURES): Vit D, 25-Hydroxy: 44 ng/mL (ref 30–100)

## 2019-08-23 LAB — LIPID PANEL
Cholesterol: 203 mg/dL — ABNORMAL HIGH (ref <200)
HDL: 101 mg/dL (ref >=50)
LDL Cholesterol (Calc): 91 mg/dL (calc)
Non-HDL Cholesterol (Calc): 102 mg/dL (calc) (ref <130)
Total CHOL/HDL Ratio: 2 (calc) (ref <5.0)
Triglycerides: 36 mg/dL (ref <150)

## 2019-08-23 LAB — PARATHYROID HORMONE, INTACT (NO CA): PTH: 54 pg/mL (ref 14–64)

## 2019-09-24 ENCOUNTER — Other Ambulatory Visit: Payer: Self-pay | Admitting: Family Medicine

## 2019-09-24 DIAGNOSIS — E1122 Type 2 diabetes mellitus with diabetic chronic kidney disease: Secondary | ICD-10-CM

## 2019-09-24 DIAGNOSIS — I10 Essential (primary) hypertension: Secondary | ICD-10-CM

## 2019-09-24 DIAGNOSIS — N183 Chronic kidney disease, stage 3 unspecified: Secondary | ICD-10-CM

## 2019-09-24 DIAGNOSIS — E785 Hyperlipidemia, unspecified: Secondary | ICD-10-CM

## 2019-09-27 ENCOUNTER — Ambulatory Visit: Payer: Self-pay

## 2019-09-29 ENCOUNTER — Ambulatory Visit: Payer: Self-pay

## 2019-10-10 ENCOUNTER — Other Ambulatory Visit: Payer: Self-pay | Admitting: Family Medicine

## 2019-10-10 DIAGNOSIS — I10 Essential (primary) hypertension: Secondary | ICD-10-CM

## 2019-11-03 ENCOUNTER — Telehealth: Payer: Self-pay | Admitting: Family Medicine

## 2019-11-03 NOTE — Telephone Encounter (Signed)
Left message for patient to schedule Annual Wellness Visit.  Please schedule with Nurse Health Advisor Victoria Britt, RN at [facility]  

## 2019-11-09 ENCOUNTER — Telehealth: Payer: Self-pay | Admitting: Family Medicine

## 2019-11-09 NOTE — Chronic Care Management (AMB) (Signed)
  Chronic Care Management   Note  11/09/2019 Name: Melanie Gray MRN: [id number] DOB: 09-Feb-1941  Melanie Gray is a 79 y.o. year old female who is a primary care patient of Steele Sizer, MD. I reached out to Darcel Smalling by phone today in response to a referral sent by Ms. [employer] health plan.     Ms. Germer was given information about Chronic Care Management services today including:  1. CCM service includes personalized support from designated clinical staff supervised by her physician, including individualized plan of care and coordination with other care providers 2. 24/7 contact phone numbers for assistance for urgent and routine care needs. 3. Service will only be billed when office clinical staff spend 20 minutes or more in a month to coordinate care. 4. Only one practitioner may furnish and bill the service in a calendar month. 5. The patient may stop CCM services at any time (effective at the end of the month) by phone call to the office staff. 6. The patient will be responsible for cost sharing (co-pay) of up to 20% of the service fee (after annual deductible is met).  Patient did not agree to enrollment in care management services and does not wish to consider at this time.  Follow up plan: The patient has been provided with contact information for the care management team and has been advised to call with any health related questions or concerns.   [place], [place] [address] Direct Dial: [phone] [email or web address] Website: [email or web address]

## 2019-11-14 ENCOUNTER — Telehealth: Payer: Self-pay

## 2019-11-14 DIAGNOSIS — Z1231 Encounter for screening mammogram for malignant neoplasm of breast: Secondary | ICD-10-CM

## 2019-11-14 DIAGNOSIS — E2839 Other primary ovarian failure: Secondary | ICD-10-CM

## 2019-11-14 NOTE — Telephone Encounter (Signed)
Copied from CRM [id number]. Topic: Referral - Request for Referral >> Nov 14, 2019 11:22 AM Gwenlyn Fudge wrote: Has patient seen PCP for this complaint? Yes.   *If NO, is insurance requiring patient see PCP for this issue before PCP can refer them? Referral for which specialty: mammogram  Preferred provider/office: [facility] Reason for referral: Mammogram and bone density. Please advise.

## 2019-11-14 NOTE — Addendum Note (Signed)
Addended by: Alba Cory F on: 11/14/2019 09:18 PM   Modules accepted: Orders

## 2019-11-15 ENCOUNTER — Ambulatory Visit (INDEPENDENT_AMBULATORY_CARE_PROVIDER_SITE_OTHER): Payer: Medicare Other

## 2019-11-15 ENCOUNTER — Other Ambulatory Visit: Payer: Self-pay

## 2019-11-15 VITALS — BP 112/72 | Ht 66.0 in | Wt 145.0 lb

## 2019-11-15 DIAGNOSIS — Z0000 Encounter for general adult medical examination without abnormal findings: Secondary | ICD-10-CM

## 2019-11-15 DIAGNOSIS — Z7189 Other specified counseling: Secondary | ICD-10-CM | POA: Diagnosis not present

## 2019-11-15 NOTE — Addendum Note (Signed)
Addended by: Alba Cory F on: 11/15/2019 09:11 PM   Modules accepted: Orders

## 2019-11-15 NOTE — Patient Instructions (Signed)
Melanie Gray , Thank you for taking time to come for your Medicare Wellness Visit. I appreciate your ongoing commitment to your health goals. Please review the following plan we discussed and let me know if I can assist you in the future.   Screening recommendations/referrals: Colonoscopy: no longer required Mammogram: done 04/27/17. Please call [phone] to schedule your mammogram and bone density screening.  Bone Density: done 11/14/15 Recommended yearly ophthalmology/optometry visit for glaucoma screening and checkup Recommended yearly dental visit for hygiene and checkup  Vaccinations: Influenza vaccine: done 03/28/19 Pneumococcal vaccine: done 03/26/15 Tdap vaccine: due Shingles vaccine: Shingrix discussed. Please contact your pharmacy for coverage information.  Covid-19: done 08/04/19 & 08/25/19  Advanced directives: Advance directive discussed with you today. Even though you declined this today please call our office should you change your mind and we can give you the proper paperwork for you to fill out.  Conditions/risks identified: Recommend preventing falls by adding grab bars in bathroom  Next appointment: Please follow up in one year for your Medicare Annual Wellness visit.     Preventive Care 40 Years and Older, Female Preventive care refers to lifestyle choices and visits with your health care provider that can promote health and wellness. What does preventive care include?  A yearly physical exam. This is also called an annual well check.  Dental exams once or twice a year.  Routine eye exams. Ask your health care provider how often you should have your eyes checked.  Personal lifestyle choices, including:  Daily care of your teeth and gums.  Regular physical activity.  Eating a healthy diet.  Avoiding tobacco and drug use.  Limiting alcohol use.  Practicing safe sex.  Taking low-dose aspirin every day.  Taking vitamin and mineral supplements as  recommended by your health care provider. What happens during an annual well check? The services and screenings done by your health care provider during your annual well check will depend on your age, overall health, lifestyle risk factors, and family history of disease. Counseling  Your health care provider may ask you questions about your:  Alcohol use.  Tobacco use.  Drug use.  Emotional well-being.  Home and relationship well-being.  Sexual activity.  Eating habits.  History of falls.  Memory and ability to understand (cognition).  Work and work Astronomer.  Reproductive health. Screening  You may have the following tests or measurements:  Height, weight, and BMI.  Blood pressure.  Lipid and cholesterol levels. These may be checked every 5 years, or more frequently if you are over 68 years old.  Skin check.  Lung cancer screening. You may have this screening every year starting at age 2 if you have a 30-pack-year history of smoking and currently smoke or have quit within the past 15 years.  Fecal occult blood test (FOBT) of the stool. You may have this test every year starting at age 52.  Flexible sigmoidoscopy or colonoscopy. You may have a sigmoidoscopy every 5 years or a colonoscopy every 10 years starting at age 48.  Hepatitis C blood test.  Hepatitis B blood test.  Sexually transmitted disease (STD) testing.  Diabetes screening. This is done by checking your blood sugar (glucose) after you have not eaten for a while (fasting). You may have this done every 1-3 years.  Bone density scan. This is done to screen for osteoporosis. You may have this done starting at age 32.  Mammogram. This may be done every 1-2 years. Talk to your health care  provider about how often you should have regular mammograms. Talk with your health care provider about your test results, treatment options, and if necessary, the need for more tests. Vaccines  Your health care  provider may recommend certain vaccines, such as:  Influenza vaccine. This is recommended every year.  Tetanus, diphtheria, and acellular pertussis (Tdap, Td) vaccine. You may need a Td booster every 10 years.  Zoster vaccine. You may need this after age 25.  Pneumococcal 13-valent conjugate (PCV13) vaccine. One dose is recommended after age 60.  Pneumococcal polysaccharide (PPSV23) vaccine. One dose is recommended after age 17. Talk to your health care provider about which screenings and vaccines you need and how often you need them. This information is not intended to replace advice given to you by your health care provider. Make sure you discuss any questions you have with your health care provider. Document Released: 07/27/2015 Document Revised: 03/19/2016 Document Reviewed: 05/01/2015 Elsevier Interactive Patient Education  2017 [employer] Prevention in the Home Falls can cause injuries. They can happen to people of all ages. There are many things you can do to make your home safe and to help prevent falls. What can I do on the outside of my home?  Regularly fix the edges of walkways and driveways and fix any cracks.  Remove anything that might make you trip as you walk through a door, such as a raised step or threshold.  Trim any bushes or trees on the path to your home.  Use bright outdoor lighting.  Clear any walking paths of anything that might make someone trip, such as rocks or tools.  Regularly check to see if handrails are loose or broken. Make sure that both sides of any steps have handrails.  Any raised decks and porches should have guardrails on the edges.  Have any leaves, snow, or ice cleared regularly.  Use sand or salt on walking paths during winter.  Clean up any spills in your garage right away. This includes oil or grease spills. What can I do in the bathroom?  Use night lights.  Install grab bars by the toilet and in the tub and shower. Do  not use towel bars as grab bars.  Use non-skid mats or decals in the tub or shower.  If you need to sit down in the shower, use a plastic, non-slip stool.  Keep the floor dry. Clean up any water that spills on the floor as soon as it happens.  Remove soap buildup in the tub or shower regularly.  Attach bath mats securely with double-sided non-slip rug tape.  Do not have throw rugs and other things on the floor that can make you trip. What can I do in the bedroom?  Use night lights.  Make sure that you have a light by your bed that is easy to reach.  Do not use any sheets or blankets that are too big for your bed. They should not hang down onto the floor.  Have a firm chair that has side arms. You can use this for support while you get dressed.  Do not have throw rugs and other things on the floor that can make you trip. What can I do in the kitchen?  Clean up any spills right away.  Avoid walking on wet floors.  Keep items that you use a lot in easy-to-reach places.  If you need to reach something above you, use a strong step stool that has a grab bar.  Keep electrical cords out of the way.  Do not use floor polish or wax that makes floors slippery. If you must use wax, use non-skid floor wax.  Do not have throw rugs and other things on the floor that can make you trip. What can I do with my stairs?  Do not leave any items on the stairs.  Make sure that there are handrails on both sides of the stairs and use them. Fix handrails that are broken or loose. Make sure that handrails are as long as the stairways.  Check any carpeting to make sure that it is firmly attached to the stairs. Fix any carpet that is loose or worn.  Avoid having throw rugs at the top or bottom of the stairs. If you do have throw rugs, attach them to the floor with carpet tape.  Make sure that you have a light switch at the top of the stairs and the bottom of the stairs. If you do not have them,  ask someone to add them for you. What else can I do to help prevent falls?  Wear shoes that:  Do not have high heels.  Have rubber bottoms.  Are comfortable and fit you well.  Are closed at the toe. Do not wear sandals.  If you use a stepladder:  Make sure that it is fully opened. Do not climb a closed stepladder.  Make sure that both sides of the stepladder are locked into place.  Ask someone to hold it for you, if possible.  Clearly mark and make sure that you can see:  Any grab bars or handrails.  First and last steps.  Where the edge of each step is.  Use tools that help you move around (mobility aids) if they are needed. These include:  Canes.  Walkers.  Scooters.  Crutches.  Turn on the lights when you go into a dark area. Replace any light bulbs as soon as they burn out.  Set up your furniture so you have a clear path. Avoid moving your furniture around.  If any of your floors are uneven, fix them.  If there are any pets around you, be aware of where they are.  Review your medicines with your doctor. Some medicines can make you feel dizzy. This can increase your chance of falling. Ask your doctor what other things that you can do to help prevent falls. This information is not intended to replace advice given to you by your health care provider. Make sure you discuss any questions you have with your health care provider. Document Released: 04/26/2009 Document Revised: 12/06/2015 Document Reviewed: 08/04/2014 Elsevier Interactive Patient Education  2017 [employer].

## 2019-11-15 NOTE — Telephone Encounter (Signed)
She has called to schedule both her mammogram and bone density and was told that no orders have been placed. Orders have been attached to this encounter. Please sign.

## 2019-11-15 NOTE — Progress Notes (Signed)
Subjective:   Melanie Gray is a 79 y.o. female who presents for an Initial Medicare Annual Wellness Visit.  Virtual Visit via Telephone Note  I connected with  Melanie Gray on 11/15/19 at  9:20 AM EDT by telephone and verified that I am speaking with the correct person using two identifiers.  Medicare Annual Wellness visit completed telephonically due to Covid-19 pandemic.   Location: Patient: home Provider: office   I discussed the limitations, risks, security and privacy concerns of performing an evaluation and management service by telephone and the availability of in person appointments. The patient expressed understanding and agreed to proceed.  Unable to perform video visit due to patient does not have video capability.   Some vital signs may be absent or patient reported.   [name], LPN    Review of Systems    Cardiac Risk Factors include: advanced age (>73men, >2 women);diabetes mellitus;dyslipidemia;hypertension     Objective:    Today's Vitals   11/15/19 0925  BP: 112/72  Weight: 145 lb (65.8 kg)  Height: 5\' 6"  (1.676 m)   Body mass index is 23.4 kg/m.  Advanced Directives 11/15/2019 09/17/2018 09/11/2017 04/08/2017 03/25/2017 12/01/2016 11/27/2016  Does Patient Have a Medical Advance Directive? No No No No No No No  Would patient like information on creating a medical advance directive? No - Patient declined No - Patient declined Yes (MAU/Ambulatory/Procedural Areas - Information given) - - - -    Current Medications (verified) Outpatient Encounter Medications as of 11/15/2019  Medication Sig  . acetaminophen (TYLENOL) 500 MG tablet Take 1,000 mg by mouth daily as needed for moderate pain or headache.  01/15/2020 amLODipine (NORVASC) 5 MG tablet TAKE 1 TABLET BY MOUTH EVERY DAY  . aspirin EC 81 MG tablet Take 81 mg by mouth daily.  . Cholecalciferol (VITAMIN D) 2000 UNITS tablet Take 2,000 Units by mouth every evening.   . Glucosamine-Chondroitin (OSTEO BI-FLEX REGULAR  STRENGTH PO) Take 1 tablet by mouth daily.  . Multiple Vitamin (MULTI-VITAMINS) TABS Take 1 tablet by mouth every evening.   Marland Kitchen Glycol-Propyl Glycol (SYSTANE OP) Apply 1 drop to eye daily as needed (dry eyes).  . pravastatin (PRAVACHOL) 40 MG tablet TAKE 1 TABLET BY MOUTH EVERY DAY  . telmisartan (MICARDIS) 40 MG tablet TAKE 1 TABLET BY MOUTH EVERY DAY   No facility-administered encounter medications on file as of 11/15/2019.    Allergies (verified) Sulfa antibiotics   History: Past Medical History:  Diagnosis Date  . Chronic kidney disease    RENAL INSUFF  . Complication of anesthesia    HA , N/V  . Diabetes mellitus without complication (HCC)   . Hyperlipidemia   . Hypertension   . Knee instability    RIGHT KNEE WEAKNESS  . PONV (postoperative nausea and vomiting)   . Vertigo    Past Surgical History:  Procedure Laterality Date  . ABDOMINAL HYSTERECTOMY    . CATARACT EXTRACTION W/PHACO Left 07/16/2017   Procedure: CATARACT EXTRACTION PHACO AND INTRAOCULAR LENS PLACEMENT (IOC);  Surgeon: 09/13/2017, MD;  Location: [facility] ORS;  Service: Ophthalmology;  Laterality: Left;  [name] 01:27.0 AP% 21.0 CDE 18.28 Fluid Pack Lot # [id number]  . CHOLECYSTECTOMY    . HERNIA REPAIR     Family History  Problem Relation Age of Onset  . Cancer Mother   . Diabetes Father   . Cancer Sister   . Breast cancer Neg Hx    Social History   Socioeconomic History  .  Marital status: Widowed    Spouse name: [name]  . Number of children: 2  . Years of education: Not on file  . Highest education level: 12th grade  Occupational History    Employer: RETIRED  Tobacco Use  . Smoking status: Never Smoker  . Smokeless tobacco: Never Used  . Tobacco comment: smoking cessation materials not required  Substance and Sexual Activity  . Alcohol use: No    Alcohol/week: 0.0 standard drinks  . Drug use: No  . Sexual activity: Not Currently  Other Topics Concern  . Not on file  Social  History Narrative  . Not on file   Social Determinants of Health   Financial Resource Strain: Low Risk   . Difficulty of Paying Living Expenses: Not hard at all  Food Insecurity: No Food Insecurity  . Worried About Programme researcher, broadcasting/film/videounning Out of Food in the Last Year: Never true  . Ran Out of Food in the Last Year: Never true  Transportation Needs: No Transportation Needs  . Lack of Transportation (Medical): No  . Lack of Transportation (Non-Medical): No  Physical Activity: Sufficiently Active  . Days of Exercise per Week: 7 days  . Minutes of Exercise per Session: 30 min  Stress: No Stress Concern Present  . Feeling of Stress : Not at all  Social Connections: Moderately Isolated  . Frequency of Communication with Friends and Family: More than three times a week  . Frequency of Social Gatherings with Friends and Family: Three times a week  . Attends Religious Services: Never  . Active Member of Clubs or Organizations: No  . Attends BankerClub or Organization Meetings: Never  . Marital Status: Widowed    Tobacco Counseling Counseling given: Not Answered Comment: smoking cessation materials not required   Clinical Intake:  Pre-visit preparation completed: Yes  Pain : No/denies pain     BMI - recorded: 23.4 Nutritional Status: BMI of 19-24  Normal Nutritional Risks: None Diabetes: Yes CBG done?: No Did pt. bring in CBG monitor from home?: No   Nutrition Risk Assessment:  Has the patient had any N/V/D within the last 2 months?  No  Does the patient have any non-healing wounds?  No  Has the patient had any unintentional weight loss or weight gain?  No   Diabetes:  Is the patient diabetic?  Yes  If diabetic, was a CBG obtained today?  No  Did the patient bring in their glucometer from home?  No  How often do you monitor your CBG's? daily.   Financial Strains and Diabetes Management:  Are you having any financial strains with the device, your supplies or your medication? No .  Does  the patient want to be seen by Chronic Care Management for management of their diabetes?  No  Would the patient like to be referred to a Nutritionist or for Diabetic Management?  No   Diabetic Exams:  Diabetic Eye Exam: Completed 07/23/17 negative retinopathy. Overdue for diabetic eye exam. Pt has been advised about the importance in completing this exam.   Diabetic Foot Exam: Completed 08/22/19.    How often do you need to have someone help you when you read instructions, pamphlets, or other written materials from your doctor or pharmacy?: 1 - Never  Interpreter Needed?: No  Information entered by :: Reather LittlerKasey Maimuna Leaman LPN   Activities of Daily Living In your present state of health, do you have any difficulty performing the following activities: 11/15/2019 08/22/2019  Hearing? N N  Comment declines hearing  aids -  Vision? N N  Difficulty concentrating or making decisions? N N  Walking or climbing stairs? N N  Dressing or bathing? N N  Doing errands, shopping? N N  Preparing Food and eating ? N -  Using the Toilet? N -  In the past six months, have you accidently leaked urine? N -  Do you have problems with loss of bowel control? N -  Managing your Medications? N -  Managing your Finances? N -  Housekeeping or managing your Housekeeping? N -  Some recent data might be hidden     Immunizations and Health Maintenance Immunization History  Administered Date(s) Administered  . Fluad Quad(high Dose 65+) 03/28/2019  . Influenza Split 03/25/2010  . Influenza, High Dose Seasonal PF 03/26/2015, 03/28/2016, 04/08/2017, 06/07/2018  . Influenza, Seasonal, Injecte, Preservative Fre 04/02/2011, 06/16/2012  . Influenza,inj,Quad PF,6+ Mos 02/24/2014  . Influenza-Unspecified 02/24/2014  . PFIZER SARS-COV-2 Vaccination 08/04/2019, 08/25/2019  . Pneumococcal Conjugate-13 03/26/2015  . Pneumococcal Polysaccharide-23 07/25/2010  . Tdap 07/21/2006  . Zoster 06/24/2013   Health Maintenance Due   Topic Date Due  . MAMMOGRAM  04/27/2018  . OPHTHALMOLOGY EXAM  07/23/2018    Patient Care Team: Alba Cory, MD as PCP - General (Family Medicine) [name], MD as Consulting Physician (Ophthalmology)  Indicate any recent Medical Services you may have received from other than [facility] providers in the past year (date may be approximate).     Assessment:   This is a routine wellness examination for [facility]  Hearing/Vision screen  Hearing Screening   125Hz  250Hz  500Hz  1000Hz  2000Hz  3000Hz  4000Hz  6000Hz  8000Hz   Right ear:           Left ear:           Comments: Pt denies difficulty hearing  Vision Screening Comments: Annual vision screenings with Dr.   Dietary issues and exercise activities discussed: Current Exercise Habits: Home exercise routine, Type of exercise: walking, Time (Minutes): 30, Frequency (Times/Week): 7, Weekly Exercise (Minutes/Week): 210, Intensity: Mild, Exercise limited by: orthopedic condition(s)  Goals    . DIET - INCREASE WATER INTAKE     Recommend to drink at least 6-8 8oz glasses of water per day.      Depression Screen PHQ 2/9 Scores 11/15/2019 08/22/2019 04/18/2019 02/14/2019 09/17/2018 08/16/2018 02/08/2018  PHQ - 2 Score 0 0 0 0 0 1 1  PHQ- 9 Score - 0 0 0 - 2 2  Exception Documentation - - - - - - -  Not completed - - - - - - -    Fall Risk Fall Risk  11/15/2019 08/22/2019 04/18/2019 02/14/2019 09/17/2018  Falls in the past year? 0 0 0 0 1  Number falls in past yr: 0 0 0 0 1  Comment - - - - tripped over garden hose outside  Injury with Fall? 0 0 0 0 0  Comment - - - - -  Risk for fall due to : Impaired balance/gait - - - -  Risk for fall due to: Comment - - - - -  Follow up Falls prevention discussed - - - Falls prevention discussed   FALL RISK PREVENTION PERTAINING TO THE HOME:  Any stairs in or around the home? Yes  If so, do they handrails? Yes   Home free of loose throw rugs in walkways, pet beds, electrical cords, etc? Yes  Adequate  lighting in your home to reduce risk of falls? Yes   ASSISTIVE DEVICES UTILIZED TO  PREVENT FALLS:  Life alert? No  Use of a cane, walker or w/c? Yes  Grab bars in the bathroom? No  Shower chair or bench in shower? No  Elevated toilet seat or a handicapped toilet? No   DME ORDERS:  DME order needed?  Yes  - transfer bench or shower chair & grab bars. Referral sent to C3 team.   TIMED UP AND GO:  Was the test performed? No . Telephonic visit.   Education: Fall risk prevention has been discussed.  Intervention(s) required? No    Cognitive Function:     6CIT Screen 11/15/2019 09/17/2018 09/11/2017  What Year? 0 points 0 points 0 points  What month? 0 points 0 points 0 points  What time? 0 points 0 points 0 points  Count back from 20 0 points 0 points 0 points  Months in reverse 0 points 0 points 0 points  Repeat phrase 2 points 2 points 6 points  Total Score 2 2 6     Screening Tests Health Maintenance  Topic Date Due  . MAMMOGRAM  04/27/2018  . OPHTHALMOLOGY EXAM  07/23/2018  . TETANUS/TDAP  07/14/2020 (Originally 07/21/2016)  . INFLUENZA VACCINE  02/12/2020  . HEMOGLOBIN A1C  02/19/2020  . FOOT EXAM  08/21/2020  . DEXA SCAN  Completed  . COVID-19 Vaccine  Completed  . PNA vac Low Risk Adult  Completed    Qualifies for Shingles Vaccine? Yes  Zostavax completed 2014. Due for Shingrix. Education has been provided regarding the importance of this vaccine. Pt has been advised to call insurance company to determine out of pocket expense. Advised may also receive vaccine at local pharmacy or Health Dept. Verbalized acceptance and understanding.  Tdap: Although this vaccine is not a covered service during a Wellness Exam, does the patient still wish to receive this vaccine today?  No .  Education has been provided regarding the importance of this vaccine. Advised may receive this vaccine at local pharmacy or Health Dept. Aware to provide a copy of the vaccination record if obtained  from local pharmacy or Health Dept. Verbalized acceptance and understanding.  Flu Vaccine: Up to date  Pneumococcal Vaccine: Up to date  Covid-19 Vaccine: Up to date  Cancer Screenings:  Colorectal Screening: No longer required.   Mammogram: Completed 04/27/17. Repeat every year. Ordered 11/14/19. Pt provided with contact information and advised to call to schedule appt.   Bone Density: Completed 11/14/15. Results reflect OSTEOPENIA. Repeat every 2 years. Ordered 11/14/19. Pt provided with contact information and advised to call to schedule appt.   Lung Cancer Screening: (Low Dose CT Chest recommended if Age 71-80 years, 30 pack-year currently smoking OR have quit w/in 15years.) does not qualify.   Additional Screening:  Hepatitis C Screening: no longer required  Vision Screening: Recommended annual ophthalmology exams for early detection of glaucoma and other disorders of the eye. Is the patient up to date with their annual eye exam?  No  - postponed due to Covid Who is the provider or what is the name of the office in which the pt attends annual eye exams? [facility]  Dental Screening: Recommended annual dental exams for proper oral hygiene  Community Resource Referral:  CRR required this visit?  Yes - grab bar installation for bathroom safety      Plan:    I have personally reviewed and addressed the Medicare Annual Wellness questionnaire and have noted the following in the patient's chart:  A. Medical and social history B.  Use of alcohol, tobacco or illicit drugs  C. Current medications and supplements D. Functional ability and status E.  Nutritional status F.  Physical activity G. Advance directives H. List of other physicians I.  Hospitalizations, surgeries, and ER visits in previous 12 months J.  [place] such as hearing and vision if needed, cognitive and depression L. Referrals and appointments   In addition, I have reviewed and discussed  with patient certain preventive protocols, quality metrics, and best practice recommendations. A written personalized care plan for preventive services as well as general preventive health recommendations were provided to patient.   Signed,  Clemetine Marker, LPN Nurse Health Advisor   Nurse Notes: none

## 2019-11-15 NOTE — Telephone Encounter (Signed)
Bone density is scheduled by the patient. I checked with Asher Muir and she has never scheduled a bone density for a patient. I will call patient and tell her to call and schedule.

## 2019-11-23 ENCOUNTER — Ambulatory Visit
Admission: RE | Admit: 2019-11-23 | Discharge: 2019-11-23 | Disposition: A | Discharge status: Home / Self Care | Payer: Medicare Other | Source: Ambulatory Visit | Attending: Family Medicine | Admitting: Family Medicine

## 2019-11-23 DIAGNOSIS — M85852 Other specified disorders of bone density and structure, left thigh: Secondary | ICD-10-CM | POA: Diagnosis not present

## 2019-11-23 DIAGNOSIS — Z780 Asymptomatic menopausal state: Secondary | ICD-10-CM | POA: Diagnosis not present

## 2019-11-23 DIAGNOSIS — E2839 Other primary ovarian failure: Secondary | ICD-10-CM | POA: Insufficient documentation

## 2019-11-23 DIAGNOSIS — Z1231 Encounter for screening mammogram for malignant neoplasm of breast: Secondary | ICD-10-CM

## 2019-11-30 ENCOUNTER — Telehealth: Payer: Self-pay | Admitting: Family Medicine

## 2019-11-30 NOTE — Telephone Encounter (Signed)
  Community Resource Referral   KNB 11/30/2019  1st Attempt LMTCB  DOB: Apr 19, 1941   AGE: 79 y.o.   GENDER: female   PCP Alba Cory, MD.   Called pt regarding Community Resource Referral LMTCB Follow up on: 12/01/2019  [facility]  Care Guide . Embedded Care Coordination [facility] Management [email or web address] [email or web address]  [phone]    Pt states she has tub shower combo with shower curtain and has a hard time lifting leg to get into shower.  She is interested in a transfer bench but unsure about DME coverage with insurance.  Pt also in need of grab bar installation in bathroom for safety and fall prevention.  Pt lives alone. Please contact to assist and if we need rx from Dr. Carlynn Purl just let me know. Thank you!

## 2019-12-01 NOTE — Telephone Encounter (Signed)
  Community Resource Referral   KNB 12/01/2019  2nd Attempt  DOB: Nov 08, 1940   AGE: 79 y.o.   GENDER: female   PCP Alba Cory, MD.   Called pt regarding Community Resource Referral LMTCB Follow up on: 12/02/2019  [facility]  Care Guide . Embedded Care Coordination [facility] Management [email or web address]  [phone]

## 2019-12-02 ENCOUNTER — Other Ambulatory Visit: Payer: Self-pay | Admitting: Emergency Medicine

## 2019-12-02 NOTE — Telephone Encounter (Signed)
  Community Resource Referral   KNB 12/02/2019  DOB: [birth year]   AGE: 79 y.o.   GENDER: female   PCP Alba Cory, MD.   Called pt regarding Community Resource Referral for DME shower bench and grab bars Discussed [facility] option if Medicare does not pay for them. Will route to Dr. Carlynn Purl to place an order for Shower Bench and [employer]. Gave my contact information to Ms. Guhl if she would like for me to make a referral [employer]  Care Guide . Embedded Care Coordination [facility] Management [email or web address]  [phone]

## 2019-12-05 DIAGNOSIS — R600 Localized edema: Secondary | ICD-10-CM | POA: Insufficient documentation

## 2019-12-05 NOTE — Telephone Encounter (Signed)
Rx for shower bench and grab bars has been faxed to [facility] as requested.

## 2019-12-07 DIAGNOSIS — D649 Anemia, unspecified: Secondary | ICD-10-CM | POA: Diagnosis not present

## 2019-12-07 DIAGNOSIS — N183 Chronic kidney disease, stage 3 unspecified: Secondary | ICD-10-CM | POA: Diagnosis not present

## 2019-12-07 DIAGNOSIS — R600 Localized edema: Secondary | ICD-10-CM | POA: Diagnosis not present

## 2019-12-07 DIAGNOSIS — I10 Essential (primary) hypertension: Secondary | ICD-10-CM | POA: Diagnosis not present

## 2019-12-07 DIAGNOSIS — R8290 Unspecified abnormal findings in urine: Secondary | ICD-10-CM | POA: Diagnosis not present

## 2019-12-14 DIAGNOSIS — N1831 Chronic kidney disease, stage 3a: Secondary | ICD-10-CM | POA: Diagnosis not present

## 2019-12-14 DIAGNOSIS — I10 Essential (primary) hypertension: Secondary | ICD-10-CM | POA: Diagnosis not present

## 2019-12-14 DIAGNOSIS — R600 Localized edema: Secondary | ICD-10-CM | POA: Diagnosis not present

## 2020-02-13 ENCOUNTER — Other Ambulatory Visit: Payer: Self-pay | Admitting: Family Medicine

## 2020-02-13 DIAGNOSIS — I10 Essential (primary) hypertension: Secondary | ICD-10-CM

## 2020-02-20 ENCOUNTER — Ambulatory Visit: Payer: Medicare Other | Admitting: Family Medicine

## 2020-03-28 ENCOUNTER — Telehealth: Payer: Self-pay | Admitting: Family Medicine

## 2020-03-28 DIAGNOSIS — E1122 Type 2 diabetes mellitus with diabetic chronic kidney disease: Secondary | ICD-10-CM

## 2020-03-28 DIAGNOSIS — I10 Essential (primary) hypertension: Secondary | ICD-10-CM

## 2020-03-28 DIAGNOSIS — N183 Chronic kidney disease, stage 3 unspecified: Secondary | ICD-10-CM

## 2020-03-28 NOTE — Telephone Encounter (Signed)
Requested medications are due for refill today? Yes  Requested medications are on active medication list?  Yes  Last Refill:   09/24/2019  # 90 with one refill.    Future visit scheduled?  No   Notes to Clinic:  Medication failed RX refill protocol due to no valid encounter in the past 6 months.    Last office visit was on 08/22/2019.  Labs in Care Everywhere were performed my Nephrologist on 12/07/2019 with Creatinine of 1.27 and Serum Potassium of 4.0.

## 2020-03-29 NOTE — Telephone Encounter (Signed)
lvm to schedule an appt 

## 2020-05-15 ENCOUNTER — Ambulatory Visit: Payer: Medicare Other | Admitting: Family Medicine

## 2020-05-16 ENCOUNTER — Ambulatory Visit (INDEPENDENT_AMBULATORY_CARE_PROVIDER_SITE_OTHER): Payer: Medicare Other | Admitting: Family Medicine

## 2020-05-16 ENCOUNTER — Encounter: Payer: Self-pay | Admitting: Family Medicine

## 2020-05-16 ENCOUNTER — Other Ambulatory Visit: Payer: Self-pay

## 2020-05-16 VITALS — BP 128/70 | HR 89 | Temp 97.7°F | Resp 16 | Ht 66.0 in | Wt 137.8 lb

## 2020-05-16 DIAGNOSIS — E1122 Type 2 diabetes mellitus with diabetic chronic kidney disease: Secondary | ICD-10-CM

## 2020-05-16 DIAGNOSIS — N183 Chronic kidney disease, stage 3 unspecified: Secondary | ICD-10-CM | POA: Diagnosis not present

## 2020-05-16 DIAGNOSIS — N3941 Urge incontinence: Secondary | ICD-10-CM

## 2020-05-16 DIAGNOSIS — M170 Bilateral primary osteoarthritis of knee: Secondary | ICD-10-CM

## 2020-05-16 DIAGNOSIS — I872 Venous insufficiency (chronic) (peripheral): Secondary | ICD-10-CM

## 2020-05-16 DIAGNOSIS — R351 Nocturia: Secondary | ICD-10-CM | POA: Diagnosis not present

## 2020-05-16 DIAGNOSIS — F3340 Major depressive disorder, recurrent, in remission, unspecified: Secondary | ICD-10-CM

## 2020-05-16 DIAGNOSIS — I10 Essential (primary) hypertension: Secondary | ICD-10-CM | POA: Diagnosis not present

## 2020-05-16 DIAGNOSIS — E785 Hyperlipidemia, unspecified: Secondary | ICD-10-CM | POA: Diagnosis not present

## 2020-05-16 DIAGNOSIS — Z23 Encounter for immunization: Secondary | ICD-10-CM | POA: Diagnosis not present

## 2020-05-16 DIAGNOSIS — N2889 Other specified disorders of kidney and ureter: Secondary | ICD-10-CM

## 2020-05-16 DIAGNOSIS — Z9181 History of falling: Secondary | ICD-10-CM

## 2020-05-16 DIAGNOSIS — N1831 Chronic kidney disease, stage 3a: Secondary | ICD-10-CM | POA: Diagnosis not present

## 2020-05-16 LAB — POCT GLYCOSYLATED HEMOGLOBIN (HGB A1C): Hemoglobin A1C: 5.7 % — AB (ref 4.0–5.6)

## 2020-05-16 MED ORDER — AMLODIPINE BESYLATE 5 MG PO TABS: 5.0000 mg | ORAL_TABLET | Freq: Every day | ORAL | 1 refills | Status: DC

## 2020-05-16 MED ORDER — OXYBUTYNIN CHLORIDE ER 5 MG PO TB24: 5.0000 mg | ORAL_TABLET | Freq: Every day | ORAL | 0 refills | Status: DC

## 2020-05-16 NOTE — Progress Notes (Signed)
Name: Melanie Gray   MRN: [id number]    DOB: 04/18/1941   Date:05/16/2020       Progress Note  Subjective  Chief Complaint  Chief Complaint  Patient presents with  . Diabetes  . Follow-up  . Hypertension    HPI    HTN: she is now onMicardis 40 mgShe denies chest pain, no dizziness  or palpitation. BP is at goal today, denies side effects of medication   Recent Fall: she was in her kitchen, she turned to placed ice back in the freezer and she fell backwards and hit her head , she was able to get up on her own, did not push her life alert button. She did not feel dizzy, denies any problems except for a goose egg on the back of her head. It happened end of October. She uses a quad cane, advised evaluation by PT at home through home health, she refused. " It is not like I fall all the time"   Diabetes with renal manifestation: GFR stable, on ARB, she sees a nephrologist.she denies polyphagia, polydipsia or polyuria but has urinary frequency and nocturia. . She follows a diabetic diet. HgbA1C is still at goal, today was 5.7 % Glucose at home is checked weekly and between 90's-110's , she has failed monofilament test   Hyperlipidemia: taking Pravastatin, denies myalgia , compliant with medication, reviewed labs labs and LDL 91 , she is 79 and prefers not changing medication , she will try to avoid fried food   Major Depression: She denies being depressed, she states she is an introvert,she is socializing over the phone, states sometimes does not feel like doing things but other days she has motivation but does not feel sad   Varicose veins: she is stopped wearing compression stocking hoses because they were too tight, she bought some long diabetic socks and seems to be helping with the aching sensation on her legs   Urge incontinence: shedrinks coffee and tea, advised to cut down on caffeine, she is wiling to start taking medication, discussed possible side effects of medication    OA knee: history of arthroscopic surgery of right knee ,she still uses a caneand walks slowly. Right knee has mild effusion intermittently    Patient Active Problem List   Diagnosis Date Noted  . Edema of lower extremity 12/05/2019  . Depression, major, recurrent, in remission (HCC) 08/10/2017  . Varicose veins of both lower extremities with inflammation 12/01/2016  . Chronic venous insufficiency 12/01/2016  . Pain in limb 12/01/2016  . Benign hypertension 11/06/2014  . Chronic anemia 11/06/2014  . Seborrheic dermatitis 11/06/2014  . Diabetes mellitus with renal manifestation (HCC) 11/06/2014  . Dyslipidemia 11/06/2014  . Cephalalgia 11/06/2014  . Deafness, sensorineural 11/06/2014  . Liver regeneration 11/06/2014  . Arthritis of knee, degenerative 11/06/2014  . Panic attack 11/06/2014  . Restless legs syndrome 11/06/2014  . Allergic rhinitis 11/06/2014  . Spinal stenosis 11/06/2014  . Osteopenia 05/09/2009  . Chronic kidney disease (CKD), stage III (moderate) (HCC) 04/20/2008    Past Surgical History:  Procedure Laterality Date  . ABDOMINAL HYSTERECTOMY    . CATARACT EXTRACTION W/PHACO Left 07/16/2017   Procedure: CATARACT EXTRACTION PHACO AND INTRAOCULAR LENS PLACEMENT (IOC);  Surgeon: [name], MD;  Location: [facility] ORS;  Service: Ophthalmology;  Laterality: Left;  [place] 01:27.0 AP% 21.0 CDE 18.28 Fluid Pack Lot # [id number]  . CHOLECYSTECTOMY    . HERNIA REPAIR      Family History  Problem Relation Age  of Onset  . Cancer Mother   . Diabetes Father   . Cancer Sister   . Breast cancer Neg Hx     Social History   Tobacco Use  . Smoking status: Never Smoker  . Smokeless tobacco: Never Used  . Tobacco comment: smoking cessation materials not required  Substance Use Topics  . Alcohol use: No    Alcohol/week: 0.0 standard drinks     Current Outpatient Medications:  .  acetaminophen (TYLENOL) 500 MG tablet, Take 1,000 mg by mouth daily as needed for  moderate pain or headache., Disp: , Rfl:  .  amLODipine (NORVASC) 5 MG tablet, TAKE 1 TABLET BY MOUTH EVERY DAY, Disp: 90 tablet, Rfl: 0 .  aspirin EC 81 MG tablet, Take 81 mg by mouth daily., Disp: , Rfl:  .  Cholecalciferol (VITAMIN D) 2000 UNITS tablet, Take 2,000 Units by mouth every evening. , Disp: , Rfl:  .  Glucosamine-Chondroitin (OSTEO BI-FLEX REGULAR STRENGTH PO), Take 1 tablet by mouth daily., Disp: , Rfl:  .  Multiple Vitamin (MULTI-VITAMINS) TABS, Take 1 tablet by mouth every evening. , Disp: , Rfl:  .  Polyethyl Glycol-Propyl Glycol (SYSTANE OP), Apply 1 drop to eye daily as needed (dry eyes)., Disp: , Rfl:  .  pravastatin (PRAVACHOL) 40 MG tablet, TAKE 1 TABLET BY MOUTH EVERY DAY, Disp: 90 tablet, Rfl: 3 .  telmisartan (MICARDIS) 40 MG tablet, TAKE 1 TABLET BY MOUTH EVERY DAY, Disp: 90 tablet, Rfl: 1  Allergies  Allergen Reactions  . Sulfa Antibiotics Rash    I personally reviewed active problem list, medication list, allergies, family history, social history, health maintenance with the patient/caregiver today.   ROS  Constitutional: Negative for fever or weight change.  Respiratory: Negative for cough and shortness of breath.   Cardiovascular: Negative for chest pain or palpitations.  Gastrointestinal: Negative for abdominal pain, no bowel changes.  Musculoskeletal: Negative for gait problem or joint swelling.  Skin: Negative for rash.  Neurological: Negative for dizziness or headache.  No other specific complaints in a complete review of systems (except as listed in HPI above).  Objective  Vitals:   05/16/20 1032  BP: 128/70  Pulse: 89  Resp: 16  Temp: 97.7 F (36.5 C)  TempSrc: Oral  SpO2: 96%  Weight: 137 lb 12.8 oz (62.5 kg)  Height: 5\' 6"  (1.676 m)    Body mass index is 22.24 kg/m.  Physical Exam  Constitutional: Patient appears well-developed and well-nourished  No distress.  HEENT: head atraumatic, normocephalic, pupils equal and reactive to  light,  neck supple Cardiovascular: Normal rate, regular rhythm and normal heart sounds.  No murmur heard. No BLE edema. Pulmonary/Chest: Effort normal and breath sounds normal. No respiratory distress. Abdominal: Soft.  There is no tenderness. Muscular skeletal: slow gait, using quad cane, crepitus with extension of right knee and mild effusion  Psychiatric: Patient has a normal mood and affect. behavior is normal. Judgment and thought content normal.  Recent Results (from the past 2160 hour(s))  POCT HgB A1C     Status: Abnormal   Collection Time: 05/16/20 10:48 AM  Result Value Ref Range   Hemoglobin A1C 5.7 (A) 4.0 - 5.6 %   HbA1c POC (<> result, manual entry)     HbA1c, POC (prediabetic range)     HbA1c, POC (controlled diabetic range)        PHQ2/9: Depression screen [facility] 2/9 05/16/2020 05/16/2020 11/15/2019 08/22/2019 04/18/2019  Decreased Interest 1 1 0 0 0  Down,  Depressed, Hopeless 0 0 0 0 0  PHQ - 2 Score 1 1 0 0 0  Altered sleeping 0 - - 0 0  Tired, decreased energy 1 - - 0 0  Change in appetite 0 - - 0 0  Feeling bad or failure about yourself  0 - - 0 0  Trouble concentrating 0 - - 0 0  Moving slowly or fidgety/restless 0 - - 0 0  Suicidal thoughts 0 - - 0 0  PHQ-9 Score 2 - - 0 0  Difficult doing work/chores Not difficult at all - - - -  Some recent data might be hidden    phq 9 is negative   Fall Risk: Fall Risk  05/16/2020 11/15/2019 08/22/2019 04/18/2019 02/14/2019  Falls in the past year? 1 0 0 0 0  Number falls in past yr: 1 0 0 0 0  Comment - - - - -  Injury with Fall? 1 0 0 0 0  Comment - - - - -  Risk for fall due to : History of fall(s) Impaired balance/gait - - -  Risk for fall due to: Comment - - - - -  Follow up - Falls prevention discussed - - -     Functional Status Survey: Is the patient deaf or have difficulty hearing?: No Does the patient have difficulty seeing, even when wearing glasses/contacts?: No Does the patient have difficulty concentrating,  remembering, or making decisions?: No Does the patient have difficulty walking or climbing stairs?: Yes Does the patient have difficulty dressing or bathing?: No Does the patient have difficulty doing errands alone such as visiting a doctor's office or shopping?: No   Assessment & Plan  1. Type 2 diabetes mellitus with stage 3a chronic kidney disease, without long-term current use of insulin (HCC)  - POCT HgB A1C  2. Need for immunization against influenza  - Flu Vaccine QUAD High Dose(Fluad)  3. Benign hypertension  - amLODipine (NORVASC) 5 MG tablet; Take 1 tablet (5 mg total) by mouth daily.  Dispense: 90 tablet; Refill: 1  4. Chronic kidney insufficiency, stage 3 (moderate) (HCC)   5. Dyslipidemia  Continue statin therapy   6. Chronic venous insufficiency   7. Primary osteoarthritis of both knees  Stable   8. Depression, major, recurrent, in remission (HCC)   9. Urge incontinence of urine  - oxybutynin (DITROPAN-XL) 5 MG 24 hr tablet; Take 1 tablet (5 mg total) by mouth at bedtime.  Dispense: 30 tablet; Refill: 0  10. History of recent fall  Does not want to have PT   11. Nocturia  - oxybutynin (DITROPAN-XL) 5 MG 24 hr tablet; Take 1 tablet (5 mg total) by mouth at bedtime.  Dispense: 30 tablet; Refill: 0

## 2020-05-29 DIAGNOSIS — Z23 Encounter for immunization: Secondary | ICD-10-CM | POA: Diagnosis not present

## 2020-06-13 ENCOUNTER — Other Ambulatory Visit: Payer: Self-pay | Admitting: Family Medicine

## 2020-06-13 DIAGNOSIS — R351 Nocturia: Secondary | ICD-10-CM

## 2020-06-13 DIAGNOSIS — N3941 Urge incontinence: Secondary | ICD-10-CM

## 2020-06-25 DIAGNOSIS — N1832 Chronic kidney disease, stage 3b: Secondary | ICD-10-CM | POA: Diagnosis not present

## 2020-06-25 DIAGNOSIS — R600 Localized edema: Secondary | ICD-10-CM | POA: Diagnosis not present

## 2020-06-25 DIAGNOSIS — I10 Essential (primary) hypertension: Secondary | ICD-10-CM | POA: Diagnosis not present

## 2020-06-25 DIAGNOSIS — R8290 Unspecified abnormal findings in urine: Secondary | ICD-10-CM | POA: Diagnosis not present

## 2020-07-08 ENCOUNTER — Other Ambulatory Visit: Payer: Self-pay | Admitting: Family Medicine

## 2020-07-08 DIAGNOSIS — R351 Nocturia: Secondary | ICD-10-CM

## 2020-07-08 DIAGNOSIS — N3941 Urge incontinence: Secondary | ICD-10-CM

## 2020-07-12 ENCOUNTER — Other Ambulatory Visit: Payer: Self-pay | Admitting: Family Medicine

## 2020-07-12 DIAGNOSIS — R351 Nocturia: Secondary | ICD-10-CM

## 2020-07-12 DIAGNOSIS — N3941 Urge incontinence: Secondary | ICD-10-CM

## 2020-08-30 ENCOUNTER — Encounter: Payer: Self-pay | Admitting: Podiatry

## 2020-08-30 ENCOUNTER — Ambulatory Visit (INDEPENDENT_AMBULATORY_CARE_PROVIDER_SITE_OTHER): Payer: Medicare Other | Admitting: Podiatry

## 2020-08-30 ENCOUNTER — Other Ambulatory Visit: Payer: Self-pay

## 2020-08-30 DIAGNOSIS — M79674 Pain in right toe(s): Secondary | ICD-10-CM | POA: Diagnosis not present

## 2020-08-30 DIAGNOSIS — N183 Chronic kidney disease, stage 3 unspecified: Secondary | ICD-10-CM | POA: Diagnosis not present

## 2020-08-30 DIAGNOSIS — M79675 Pain in left toe(s): Secondary | ICD-10-CM | POA: Diagnosis not present

## 2020-08-30 DIAGNOSIS — S99921A Unspecified injury of right foot, initial encounter: Secondary | ICD-10-CM | POA: Diagnosis not present

## 2020-08-30 DIAGNOSIS — I872 Venous insufficiency (chronic) (peripheral): Secondary | ICD-10-CM | POA: Diagnosis not present

## 2020-08-30 DIAGNOSIS — B351 Tinea unguium: Secondary | ICD-10-CM

## 2020-08-30 DIAGNOSIS — E1122 Type 2 diabetes mellitus with diabetic chronic kidney disease: Secondary | ICD-10-CM | POA: Diagnosis not present

## 2020-08-30 NOTE — Progress Notes (Signed)
This patient presents to the office with long thick nails both feet. This patient requires at risk care due to kidney disease.  She says she has remained home for the last 2 years due to covid.  During that time her nails have grown long thick and deformed.  She says she was caring for her feet when she saw the big toenail right foot was bleeding.  She says the toenail is sore and the right big toenail is falling off.  She presents for evaluation and treatment.  General Appearance  Alert, conversant and in no acute stress.  Vascular  Dorsalis pedis and posterior tibial  pulses are weakly  palpable  bilaterally.  Capillary return is within normal limits  bilaterally. Temperature is within normal limits  bilaterally.  Neurologic  Senn-Weinstein monofilament wire test within normal limits  bilaterally. Muscle power within normal limits bilaterally.  Nails Thick disfigured discolored nails with subungual debris  from hallux to fifth toes bilaterally. No evidence of bacterial infection or drainage bilaterally. Her right big toenail has separated from her nail bed proximally..    Orthopedic  No limitations of motion  feet .  No crepitus or effusions noted.  No bony pathology or digital deformities noted.  Skin  normotropic skin with no porokeratosis noted bilaterally.  No signs of infections or ulcers noted.  Asymptomatic clavi 3rd toe left foot.  Onychomycosis  Injured right hallux nail    ROV.   Removal of injured nail right hallux followed by neosporin/DSD. Home soak instructions given to this patient.  Debridement and grinding of long thick nails both feet.   RTC 3 months.  Helane Gunther DPM

## 2020-08-30 NOTE — Patient Instructions (Signed)

## 2020-09-13 NOTE — Progress Notes (Signed)
Name: Melanie Gray   MRN: [id number]    DOB: Oct 25, 1940   Date:09/17/2020       Progress Note  Subjective  Chief Complaint  Follow Up  HPI  HTN: she is now onMicardis 40 mgand nephrologist gave her Amlodipine 5 mg last year because her bp was spiking, during her visit at nephrologist back in Dec bp was 130's. At home it has been lower 100-115/66-74, she feels dizzy when she stands up She denies chest pain or palpitation. We will adjust dose of norvasc from 5 mg to 2.5 mg   Diabetes with renal manifestation: GFR stable, on ARB, she sees a nephrologist.she denies polyphagia, polydipsia or polyuria but has urinary frequency and nocturia. . She follows a diabetic diet. HgbA1C is still at goal, today was 5.7 % Glucose at home is checked weekly and between 90's-110's , she has failed monofilament test   Hyperlipidemia: taking Pravastatin, denies myalgia , compliant with medication, reviewed labs labs and LDL 91 , she is 79 and prefers not changing medication , she eats smaller portions, avoiding fried food and we will recheck level today   Major Depression: She denies being depressed, she states she is an introvert,she is socializing over the phone. Her son has moved in with her early 2022 because she was afraid after her fall in 2021. She states the support has been good   Varicose veins: she is stopped wearing compression stocking hoses because they were too tight, she bought some long diabetic socks and seems to be helping with the aching sensation on her legs . Unchanged   Urge incontinence: shedrinks coffee and tea, advised to cut down on caffeine,  I gave her ditropan last visit but she never took it, she was afraid of side effects.   OA knee: history of arthroscopic surgery of right knee ,she still uses a caneand walks slowly. Right knee has mild effusion intermittently , she walks very slowly   Patient Active Problem List   Diagnosis Date Noted  . Pain due to onychomycosis of  toenails of both feet 08/30/2020  . Injury of toenail of right foot 08/30/2020  . Edema of lower extremity 12/05/2019  . Depression, major, recurrent, in remission (HCC) 08/10/2017  . Varicose veins of both lower extremities with inflammation 12/01/2016  . Chronic venous insufficiency 12/01/2016  . Pain in limb 12/01/2016  . Benign hypertension 11/06/2014  . Chronic anemia 11/06/2014  . Seborrheic dermatitis 11/06/2014  . Dyslipidemia 11/06/2014  . Cephalalgia 11/06/2014  . Deafness, sensorineural 11/06/2014  . Arthritis of knee, degenerative 11/06/2014  . Panic attack 11/06/2014  . Restless legs syndrome 11/06/2014  . Allergic rhinitis 11/06/2014  . Spinal stenosis 11/06/2014  . Osteopenia 05/09/2009  . Chronic kidney disease (CKD), stage III (moderate) (HCC) 04/20/2008    Past Surgical History:  Procedure Laterality Date  . ABDOMINAL HYSTERECTOMY    . CATARACT EXTRACTION W/PHACO Left 07/16/2017   Procedure: CATARACT EXTRACTION PHACO AND INTRAOCULAR LENS PLACEMENT (IOC);  Surgeon: [name], MD;  Location: [facility] ORS;  Service: Ophthalmology;  Laterality: Left;  [place] 01:27.0 AP% 21.0 CDE 18.28 Fluid Pack Lot # [id number]  . CHOLECYSTECTOMY    . HERNIA REPAIR      Family History  Problem Relation Age of Onset  . Cancer Mother   . Diabetes Father   . Cancer Sister   . Breast cancer Neg Hx     Social History   Tobacco Use  . Smoking status: Never Smoker  . Smokeless  tobacco: Never Used  . Tobacco comment: smoking cessation materials not required  Substance Use Topics  . Alcohol use: No    Alcohol/week: 0.0 standard drinks     Current Outpatient Medications:  .  aspirin EC 81 MG tablet, Take 81 mg by mouth daily., Disp: , Rfl:  .  Cholecalciferol (VITAMIN D) 2000 UNITS tablet, Take 2,000 Units by mouth every evening. , Disp: , Rfl:  .  Glucosamine-Chondroitin (OSTEO BI-FLEX REGULAR STRENGTH PO), Take 1 tablet by mouth daily., Disp: , Rfl:  .  Multiple Vitamin  (MULTI-VITAMINS) TABS, Take 1 tablet by mouth every evening. , Disp: , Rfl:  .  Polyethyl Glycol-Propyl Glycol (SYSTANE OP), Apply 1 drop to eye daily as needed (dry eyes)., Disp: , Rfl:  .  acetaminophen (TYLENOL) 500 MG tablet, Take 1,000 mg by mouth daily as needed for moderate pain or headache. (Patient not taking: No sig reported), Disp: , Rfl:  .  amLODipine (NORVASC) 2.5 MG tablet, Take 1 tablet (2.5 mg total) by mouth daily., Disp: 90 tablet, Rfl: 1 .  pravastatin (PRAVACHOL) 40 MG tablet, TAKE 1 TABLET BY MOUTH EVERY DAY, Disp: 90 tablet, Rfl: 3 .  telmisartan (MICARDIS) 40 MG tablet, Take 1 tablet (40 mg total) by mouth daily., Disp: 90 tablet, Rfl: 1  Allergies  Allergen Reactions  . Sulfa Antibiotics Rash    I personally reviewed active problem list, medication list, allergies, family history, social history, health maintenance with the patient/caregiver today.   ROS  Constitutional: Negative for fever or weight change.  Respiratory: Negative for cough and shortness of breath.   Cardiovascular: Negative for chest pain or palpitations.  Gastrointestinal: Negative for abdominal pain, no bowel changes.  Musculoskeletal:positive for gait problem and intermittent knee joint swelling.  Skin: Negative for rash.  Neurological: positive  for intermittent dizziness but no headache.  No other specific complaints in a complete review of systems (except as listed in HPI above).  Objective  Vitals:   09/17/20 1015  BP: 116/72  Pulse: 69  Resp: 14  Temp: 98 F (36.7 C)  TempSrc: Oral  SpO2: 98%  Weight: 140 lb 11.2 oz (63.8 kg)  Height: 5\' 6"  (1.676 m)    Body mass index is 22.71 kg/m.  Physical Exam  Constitutional: Patient appears well-developed and well-nourished.  No distress.  HEENT: head atraumatic, normocephalic, pupils equal and reactive to light, neck supple Cardiovascular: Normal rate, regular rhythm and normal heart sounds.  No murmur heard. No BLE  edema. Pulmonary/Chest: Effort normal and breath sounds normal. No respiratory distress. Abdominal: Soft.  There is no tenderness. Muscular skeletal: crepitus with extension of both knees  Psychiatric: Patient has a normal mood and affect. behavior is normal. Judgment and thought content normal.  Recent Results (from the past 2160 hour(s))  POCT HgB A1C     Status: Normal   Collection Time: 09/17/20 10:17 AM  Result Value Ref Range   Hemoglobin A1C 5.3 4.0 - 5.6 %   HbA1c POC (<> result, manual entry)     HbA1c, POC (prediabetic range)     HbA1c, POC (controlled diabetic range)       PHQ2/9: Depression screen [facility] 2/9 09/17/2020 05/16/2020 05/16/2020 11/15/2019 08/22/2019  Decreased Interest 0 1 1 0 0  Down, Depressed, Hopeless 0 0 0 0 0  PHQ - 2 Score 0 1 1 0 0  Altered sleeping - 0 - - 0  Tired, decreased energy - 1 - - 0  Change in appetite -  0 - - 0  Feeling bad or failure about yourself  - 0 - - 0  Trouble concentrating - 0 - - 0  Moving slowly or fidgety/restless - 0 - - 0  Suicidal thoughts - 0 - - 0  PHQ-9 Score - 2 - - 0  Difficult doing work/chores - Not difficult at all - - -  Some recent data might be hidden    phq 9 is negative   Fall Risk: Fall Risk  09/17/2020 05/16/2020 11/15/2019 08/22/2019 04/18/2019  Falls in the past year? 1 1 0 0 0  Number falls in past yr: 0 1 0 0 0  Comment - - - - -  Injury with Fall? 1 1 0 0 0  Comment - - - - -  Risk for fall due to : History of fall(s);Impaired balance/gait History of fall(s) Impaired balance/gait - -  Risk for fall due to: Comment - - - - -  Follow up Falls prevention discussed - Falls prevention discussed - -     Functional Status Survey: Is the patient deaf or have difficulty hearing?: No Does the patient have difficulty seeing, even when wearing glasses/contacts?: No Does the patient have difficulty concentrating, remembering, or making decisions?: No Does the patient have difficulty walking or climbing stairs?:  Yes Does the patient have difficulty dressing or bathing?: No Does the patient have difficulty doing errands alone such as visiting a doctor's office or shopping?: Yes   Assessment & Plan  1. Chronic kidney insufficiency, stage 3 (moderate) (HCC)  - telmisartan (MICARDIS) 40 MG tablet; Take 1 tablet (40 mg total) by mouth daily.  Dispense: 90 tablet; Refill: 1 - VITAMIN D 25 Hydroxy (Vit-D Deficiency, Fractures)  2. Dyslipidemia  - pravastatin (PRAVACHOL) 40 MG tablet; TAKE 1 TABLET BY MOUTH EVERY DAY  Dispense: 90 tablet; Refill: 3 - Lipid panel  3. Benign hypertension with chronic kidney disease, stage III (HCC)  - COMPLETE METABOLIC PANEL WITH GFR  4. History of diabetes mellitus, type II  - POCT HgB A1C  5. Depression, major, recurrent, in remission (HCC)   6. Primary osteoarthritis of both knees   7. Urge incontinence of urine   8. Chronic venous insufficiency   9. Benign hypertension  - amLODipine (NORVASC) 2.5 MG tablet; Take 1 tablet (2.5 mg total) by mouth daily.  Dispense: 90 tablet; Refill: 1 - telmisartan (MICARDIS) 40 MG tablet; Take 1 tablet (40 mg total) by mouth daily.  Dispense: 90 tablet; Refill: 1  10. Anemia, unspecified type  - CBC with Differential/Platelet - Iron, TIBC and Ferritin Panel

## 2020-09-17 ENCOUNTER — Ambulatory Visit (INDEPENDENT_AMBULATORY_CARE_PROVIDER_SITE_OTHER): Payer: Medicare Other | Admitting: Family Medicine

## 2020-09-17 ENCOUNTER — Encounter: Payer: Self-pay | Admitting: Family Medicine

## 2020-09-17 ENCOUNTER — Other Ambulatory Visit: Payer: Self-pay

## 2020-09-17 VITALS — BP 116/72 | HR 69 | Temp 98.0°F | Resp 14 | Ht 66.0 in | Wt 140.7 lb

## 2020-09-17 DIAGNOSIS — D649 Anemia, unspecified: Secondary | ICD-10-CM | POA: Diagnosis not present

## 2020-09-17 DIAGNOSIS — N183 Chronic kidney disease, stage 3 unspecified: Secondary | ICD-10-CM | POA: Diagnosis not present

## 2020-09-17 DIAGNOSIS — F3340 Major depressive disorder, recurrent, in remission, unspecified: Secondary | ICD-10-CM | POA: Diagnosis not present

## 2020-09-17 DIAGNOSIS — M170 Bilateral primary osteoarthritis of knee: Secondary | ICD-10-CM

## 2020-09-17 DIAGNOSIS — N3941 Urge incontinence: Secondary | ICD-10-CM | POA: Diagnosis not present

## 2020-09-17 DIAGNOSIS — E785 Hyperlipidemia, unspecified: Secondary | ICD-10-CM

## 2020-09-17 DIAGNOSIS — I10 Essential (primary) hypertension: Secondary | ICD-10-CM

## 2020-09-17 DIAGNOSIS — I872 Venous insufficiency (chronic) (peripheral): Secondary | ICD-10-CM | POA: Diagnosis not present

## 2020-09-17 DIAGNOSIS — Z8639 Personal history of other endocrine, nutritional and metabolic disease: Secondary | ICD-10-CM

## 2020-09-17 DIAGNOSIS — I129 Hypertensive chronic kidney disease with stage 1 through stage 4 chronic kidney disease, or unspecified chronic kidney disease: Secondary | ICD-10-CM

## 2020-09-17 DIAGNOSIS — N1831 Chronic kidney disease, stage 3a: Secondary | ICD-10-CM

## 2020-09-17 LAB — POCT GLYCOSYLATED HEMOGLOBIN (HGB A1C): Hemoglobin A1C: 5.3 % (ref 4.0–5.6)

## 2020-09-17 MED ORDER — PRAVASTATIN SODIUM 40 MG PO TABS: ORAL_TABLET | ORAL | 3 refills | Status: DC

## 2020-09-17 MED ORDER — TELMISARTAN 40 MG PO TABS: 40.0000 mg | ORAL_TABLET | Freq: Every day | ORAL | 1 refills | Status: DC

## 2020-09-17 MED ORDER — AMLODIPINE BESYLATE 2.5 MG PO TABS: 2.5000 mg | ORAL_TABLET | Freq: Every day | ORAL | 1 refills | Status: DC

## 2020-09-18 LAB — CBC WITH DIFFERENTIAL/PLATELET
Absolute Monocytes: 473 cells/uL (ref 200–950)
Basophils Absolute: 18 cells/uL (ref 0–200)
Basophils Relative: 0.4 %
Eosinophils Absolute: 41 cells/uL (ref 15–500)
Eosinophils Relative: 0.9 %
HCT: 35 % (ref 35.0–45.0)
Hemoglobin: 11.5 g/dL — ABNORMAL LOW (ref 11.7–15.5)
Lymphs Abs: 1283 cells/uL (ref 850–3900)
MCH: 29.2 pg (ref 27.0–33.0)
MCHC: 32.9 g/dL (ref 32.0–36.0)
MCV: 88.8 fL (ref 80.0–100.0)
MPV: 11.1 fL (ref 7.5–12.5)
Monocytes Relative: 10.5 %
Neutro Abs: 2687 cells/uL (ref 1500–7800)
Neutrophils Relative %: 59.7 %
Platelets: 237 10*3/uL (ref 140–400)
RBC: 3.94 10*6/uL (ref 3.80–5.10)
RDW: 14.2 % (ref 11.0–15.0)
Total Lymphocyte: 28.5 %
WBC: 4.5 10*3/uL (ref 3.8–10.8)

## 2020-09-18 LAB — IRON,TIBC AND FERRITIN PANEL
%SAT: 21 % (calc) (ref 16–45)
Ferritin: 228 ng/mL (ref 16–288)
Iron: 70 ug/dL (ref 45–160)
TIBC: 327 mcg/dL (calc) (ref 250–450)

## 2020-09-18 LAB — LIPID PANEL
Cholesterol: 213 mg/dL — ABNORMAL HIGH (ref <200)
HDL: 104 mg/dL (ref >=50)
LDL Cholesterol (Calc): 96 mg/dL (calc)
Non-HDL Cholesterol (Calc): 109 mg/dL (calc) (ref <130)
Total CHOL/HDL Ratio: 2 (calc) (ref <5.0)
Triglycerides: 43 mg/dL (ref <150)

## 2020-09-18 LAB — COMPLETE METABOLIC PANEL WITH GFR
AG Ratio: 1.7 (calc) (ref 1.0–2.5)
ALT: 14 U/L (ref 6–29)
AST: 24 U/L (ref 10–35)
Albumin: 4.5 g/dL (ref 3.6–5.1)
Alkaline phosphatase (APISO): 81 U/L (ref 37–153)
BUN/Creatinine Ratio: 19 (calc) (ref 6–22)
BUN: 25 mg/dL (ref 7–25)
CO2: 28 mmol/L (ref 20–32)
Calcium: 10 mg/dL (ref 8.6–10.4)
Chloride: 108 mmol/L (ref 98–110)
Creat: 1.32 mg/dL — ABNORMAL HIGH (ref 0.60–0.88)
GFR, Est African American: 44 mL/min/{1.73_m2} — ABNORMAL LOW (ref >=60)
GFR, Est Non African American: 38 mL/min/{1.73_m2} — ABNORMAL LOW (ref >=60)
Globulin: 2.6 g/dL (calc) (ref 1.9–3.7)
Glucose, Bld: 102 mg/dL — ABNORMAL HIGH (ref 65–99)
Potassium: 4.9 mmol/L (ref 3.5–5.3)
Sodium: 145 mmol/L (ref 135–146)
Total Bilirubin: 0.6 mg/dL (ref 0.2–1.2)
Total Protein: 7.1 g/dL (ref 6.1–8.1)

## 2020-09-18 LAB — VITAMIN D 25 HYDROXY (VIT D DEFICIENCY, FRACTURES): Vit D, 25-Hydroxy: 55 ng/mL (ref 30–100)

## 2020-11-08 ENCOUNTER — Other Ambulatory Visit: Payer: Self-pay | Admitting: Family Medicine

## 2020-11-08 DIAGNOSIS — I10 Essential (primary) hypertension: Secondary | ICD-10-CM

## 2020-11-20 ENCOUNTER — Ambulatory Visit: Payer: Medicare Other

## 2020-11-29 ENCOUNTER — Other Ambulatory Visit: Payer: Self-pay

## 2020-11-29 ENCOUNTER — Encounter: Payer: Self-pay | Admitting: Podiatry

## 2020-11-29 ENCOUNTER — Ambulatory Visit (INDEPENDENT_AMBULATORY_CARE_PROVIDER_SITE_OTHER): Payer: Medicare Other | Admitting: Podiatry

## 2020-11-29 DIAGNOSIS — E1122 Type 2 diabetes mellitus with diabetic chronic kidney disease: Secondary | ICD-10-CM | POA: Diagnosis not present

## 2020-11-29 DIAGNOSIS — M79674 Pain in right toe(s): Secondary | ICD-10-CM | POA: Diagnosis not present

## 2020-11-29 DIAGNOSIS — B351 Tinea unguium: Secondary | ICD-10-CM | POA: Diagnosis not present

## 2020-11-29 DIAGNOSIS — N183 Chronic kidney disease, stage 3 unspecified: Secondary | ICD-10-CM

## 2020-11-29 DIAGNOSIS — M79675 Pain in left toe(s): Secondary | ICD-10-CM

## 2020-11-29 DIAGNOSIS — I872 Venous insufficiency (chronic) (peripheral): Secondary | ICD-10-CM

## 2020-11-29 NOTE — Progress Notes (Signed)
This patient returns to my office for at risk foot care.  This patient requires this care by a professional since this patient will be at risk due to having chronic kidney disease and chronic venous insufficiency.  This patient is unable to cut nails herself since the patient cannot reach her nails.These nails are painful walking and wearing shoes.  This patient presents for at risk foot care today.  General Appearance  Alert, conversant and in no acute stress.  Vascular  Dorsalis pedis and posterior tibial  pulses are weakly  palpable  bilaterally.  Capillary return is within normal limits  bilaterally. Temperature is within normal limits  bilaterally.  Neurologic  Senn-Weinstein monofilament wire test within normal limits  bilaterally. Muscle power within normal limits bilaterally.  Nails Thick disfigured discolored nails with subungual debris  from hallux to fifth toes bilaterally. No evidence of bacterial infection or drainage bilaterally.  Orthopedic  No limitations of motion  feet .  No crepitus or effusions noted.  No bony pathology or digital deformities noted.  Skin  normotropic skin with no porokeratosis noted bilaterally.  No signs of infections or ulcers noted.     Onychomycosis  Pain in right toes  Pain in left toes  Consent was obtained for treatment procedures.   Mechanical debridement of nails 1-5  bilaterally performed with a nail nipper.  Filed with dremel without incident.    Return office visit  3 months                   Told patient to return for periodic foot care and evaluation due to potential at risk complications.   Helane Gunther DPM

## 2020-12-06 ENCOUNTER — Ambulatory Visit (INDEPENDENT_AMBULATORY_CARE_PROVIDER_SITE_OTHER): Payer: Medicare Other

## 2020-12-06 ENCOUNTER — Other Ambulatory Visit: Payer: Self-pay

## 2020-12-06 VITALS — BP 138/84 | HR 73 | Temp 97.6°F | Resp 16 | Ht 66.0 in | Wt 142.3 lb

## 2020-12-06 DIAGNOSIS — Z1231 Encounter for screening mammogram for malignant neoplasm of breast: Secondary | ICD-10-CM

## 2020-12-06 DIAGNOSIS — Z0000 Encounter for general adult medical examination without abnormal findings: Secondary | ICD-10-CM | POA: Diagnosis not present

## 2020-12-06 NOTE — Progress Notes (Signed)
Subjective:   Melanie Gray is a 80 y.o. female who presents for Medicare Annual (Subsequent) preventive examination.  Review of Systems     Cardiac Risk Factors include: advanced age (>2355men, 30>65 women);dyslipidemia;hypertension     Objective:    Today's Vitals   12/06/20 1042  BP: 138/84  Pulse: 73  Resp: 16  Temp: 97.6 F (36.4 C)  TempSrc: Oral  SpO2: 99%  Weight: 142 lb 4.8 oz (64.5 kg)  Height: 5\' 6"  (1.676 m)   Body mass index is 22.97 kg/m.  Advanced Directives 12/06/2020 11/15/2019 09/17/2018 09/11/2017 04/08/2017 03/25/2017 12/01/2016  Does Patient Have a Medical Advance Directive? No No No No No No No  Would patient like information on creating a medical advance directive? Yes (MAU/Ambulatory/Procedural Areas - Information given) No - Patient declined No - Patient declined Yes (MAU/Ambulatory/Procedural Areas - Information given) - - -    Current Medications (verified) Outpatient Encounter Medications as of 12/06/2020  Medication Sig  . acetaminophen (TYLENOL) 500 MG tablet Take 1,000 mg by mouth daily as needed for moderate pain or headache.  Marland Kitchen. amLODipine (NORVASC) 2.5 MG tablet Take 1 tablet (2.5 mg total) by mouth daily.  Marland Kitchen. aspirin EC 81 MG tablet Take 81 mg by mouth daily.  . Cholecalciferol (VITAMIN D) 2000 UNITS tablet Take 2,000 Units by mouth every evening.   . Glucosamine-Chondroitin (OSTEO BI-FLEX REGULAR STRENGTH PO) Take 1 tablet by mouth daily.  . Multiple Vitamin (MULTI-VITAMINS) TABS Take 1 tablet by mouth every evening.   Bertram Gala. Polyethyl Glycol-Propyl Glycol (SYSTANE OP) Apply 1 drop to eye daily as needed (dry eyes).  . pravastatin (PRAVACHOL) 40 MG tablet TAKE 1 TABLET BY MOUTH EVERY DAY  . telmisartan (MICARDIS) 40 MG tablet Take 1 tablet (40 mg total) by mouth daily.   No facility-administered encounter medications on file as of 12/06/2020.    Allergies (verified) Sulfa antibiotics   History: Past Medical History:  Diagnosis Date  . Chronic kidney  disease    RENAL INSUFF  . Complication of anesthesia    HA , N/V  . Diabetes mellitus without complication (HCC)   . Hyperlipidemia   . Hypertension   . Knee instability    RIGHT KNEE WEAKNESS  . PONV (postoperative nausea and vomiting)   . Vertigo    Past Surgical History:  Procedure Laterality Date  . ABDOMINAL HYSTERECTOMY    . CATARACT EXTRACTION W/PHACO Left 07/16/2017   Procedure: CATARACT EXTRACTION PHACO AND INTRAOCULAR LENS PLACEMENT (IOC);  Surgeon: Nevada CraneKing, Bradley Mark, MD;  Location: [facility] ORS;  Service: Ophthalmology;  Laterality: Left;  US 01:27.0 AP% 21.0 CDE 18.28 Fluid Pack Lot # [id number]  . CHOLECYSTECTOMY    . HERNIA REPAIR     Family History  Problem Relation Age of Onset  . Cancer Mother   . Diabetes Father   . Cancer Sister   . Breast cancer Neg Hx    Social History   Socioeconomic History  . Marital status: Widowed    Spouse name: [name]  . Number of children: 2  . Years of education: Not on file  . Highest education level: 12th grade  Occupational History    Employer: RETIRED  Tobacco Use  . Smoking status: Never Smoker  . Smokeless tobacco: Never Used  . Tobacco comment: smoking cessation materials not required  Vaping Use  . Vaping Use: Never used  Substance and Sexual Activity  . Alcohol use: No    Alcohol/week: 0.0 standard drinks  . Drug use:  No  . Sexual activity: Not Currently  Other Topics Concern  . Not on file  Social History Narrative   Son moved in with her early 2022    Social Determinants of Health   Financial Resource Strain: Low Risk   . Difficulty of Paying Living Expenses: Not hard at all  Food Insecurity: No Food Insecurity  . Worried About Programme researcher, broadcasting/film/video in the Last Year: Never true  . Ran Out of Food in the Last Year: Never true  Transportation Needs: No Transportation Needs  . Lack of Transportation (Medical): No  . Lack of Transportation (Non-Medical): No  Physical Activity: Inactive  . Days of Exercise  per Week: 0 days  . Minutes of Exercise per Session: 0 min  Stress: No Stress Concern Present  . Feeling of Stress : Only a little  Social Connections: Socially Isolated  . Frequency of Communication with Friends and Family: More than three times a week  . Frequency of Social Gatherings with Friends and Family: Twice a week  . Attends Religious Services: Never  . Active Member of Clubs or Organizations: No  . Attends Banker Meetings: Never  . Marital Status: Widowed    Tobacco Counseling Counseling given: Not Answered Comment: smoking cessation materials not required   Clinical Intake:  Pre-visit preparation completed: Yes  Pain : No/denies pain     BMI - recorded: 22.97 Nutritional Status: BMI of 19-24  Normal Nutritional Risks: None Diabetes: No  How often do you need to have someone help you when you read instructions, pamphlets, or other written materials from your doctor or pharmacy?: 1 - Never    Interpreter Needed?: No  Information entered by :: [name] LPN   Activities of Daily Living In your present state of health, do you have any difficulty performing the following activities: 12/06/2020 09/17/2020  Hearing? N N  Comment declines hearing aids -  Vision? N N  Difficulty concentrating or making decisions? N N  Walking or climbing stairs? Y Y  Dressing or bathing? Y N  Doing errands, shopping? N Y  Quarry manager and eating ? N -  Using the Toilet? N -  In the past six months, have you accidently leaked urine? Y -  Comment wears depends -  Do you have problems with loss of bowel control? N -  Managing your Medications? N -  Managing your Finances? N -  Housekeeping or managing your Housekeeping? N -  Some recent data might be hidden    Patient Care Team: Alba Cory, MD as PCP - General (Family Medicine) [name], MD as Consulting Physician (Ophthalmology) Mosetta Pigeon, MD (Nephrology) Helane Gunther, DPM as  Consulting Physician (Podiatry)  Indicate any recent Medical Services you may have received from other than [facility] providers in the past year (date may be approximate).     Assessment:   This is a routine wellness examination for [facility].  Hearing/Vision screen  Hearing Screening   125Hz  250Hz  500Hz  1000Hz  2000Hz  3000Hz  4000Hz  6000Hz  8000Hz   Right ear:           Left ear:           Comments: Pt denies difficulty hearing  Vision Screening Comments: Annual vision screenings with Dr. ; due for exam   Dietary issues and exercise activities discussed: Current Exercise Habits: The patient does not participate in regular exercise at present, Exercise limited by: orthopedic condition(s)  Goals Addressed  This Visit's Progress   . DIET - INCREASE WATER INTAKE   On track    Recommend to drink at least 6-8 8oz glasses of water per day.      Depression Screen PHQ 2/9 Scores 12/06/2020 09/17/2020 05/16/2020 05/16/2020 11/15/2019 08/22/2019 04/18/2019  PHQ - 2 Score 0 0 1 1 0 0 0  PHQ- 9 Score - - 2 - - 0 0  Exception Documentation - - - - - - -  Not completed - - - - - - -    Fall Risk Fall Risk  12/06/2020 09/17/2020 05/16/2020 11/15/2019 08/22/2019  Falls in the past year? 0 1 1 0 0  Number falls in past yr: 0 0 1 0 0  Comment - - - - -  Injury with Fall? 0 1 1 0 0  Comment - - - - -  Risk for fall due to : Impaired balance/gait History of fall(s);Impaired balance/gait History of fall(s) Impaired balance/gait -  Risk for fall due to: Comment - - - - -  Follow up Falls prevention discussed Falls prevention discussed - Falls prevention discussed -    FALL RISK PREVENTION PERTAINING TO THE HOME:  Any stairs in or around the home? Yes  If so, are there any without handrails? No  Home free of loose throw rugs in walkways, pet beds, electrical cords, etc? Yes  Adequate lighting in your home to reduce risk of falls? Yes   ASSISTIVE DEVICES UTILIZED TO PREVENT FALLS:  Life alert? Yes   Use of a cane, walker or w/c? Yes  Grab bars in the bathroom? No  Shower chair or bench in shower? No  Elevated toilet seat or a handicapped toilet? Yes   TIMED UP AND GO:  Was the test performed? Yes .  Length of time to ambulate 10 feet: 9 sec.   Gait slow and steady with assistive device  Cognitive Function: Normal cognitive status assessed by direct observation by this Nurse Health Advisor. No abnormalities found.       6CIT Screen 11/15/2019 09/17/2018 09/11/2017  What Year? 0 points 0 points 0 points  What month? 0 points 0 points 0 points  What time? 0 points 0 points 0 points  Count back from 20 0 points 0 points 0 points  Months in reverse 0 points 0 points 0 points  Repeat phrase 2 points 2 points 6 points  Total Score 2 2 6     Immunizations Immunization History  Administered Date(s) Administered  . Fluad Quad(high Dose 65+) 03/28/2019, 05/16/2020  . Influenza Split 03/25/2010  . Influenza, High Dose Seasonal PF 03/26/2015, 03/28/2016, 04/08/2017, 06/07/2018  . Influenza, Seasonal, Injecte, Preservative Fre 04/02/2011, 06/16/2012  . Influenza,inj,Quad PF,6+ Mos 02/24/2014  . Influenza-Unspecified 02/24/2014  . PFIZER(Purple Top)SARS-COV-2 Vaccination 08/04/2019, 08/25/2019, 05/29/2020  . Pneumococcal Conjugate-13 03/26/2015  . Pneumococcal Polysaccharide-23 07/25/2010  . Tdap 07/21/2006  . Zoster 06/24/2013  . Zoster Recombinat (Shingrix) 04/24/2020, 06/25/2020    TDAP status: Due, Education has been provided regarding the importance of this vaccine. Advised may receive this vaccine at local pharmacy or Health Dept. Aware to provide a copy of the vaccination record if obtained from local pharmacy or Health Dept. Verbalized acceptance and understanding.  Flu Vaccine status: Up to date  Pneumococcal vaccine status: Up to date  Covid-19 vaccine status: Completed vaccines  Qualifies for Shingles Vaccine? Yes   Zostavax completed Yes   Shingrix Completed?:  Yes  Screening Tests Health Maintenance  Topic Date Due  .  OPHTHALMOLOGY EXAM  07/23/2018  . MAMMOGRAM  11/22/2020  . TETANUS/TDAP  09/17/2021 (Originally 07/21/2016)  . INFLUENZA VACCINE  02/11/2021  . HEMOGLOBIN A1C  03/20/2021  . FOOT EXAM  08/30/2021  . DEXA SCAN  Completed  . COVID-19 Vaccine  Completed  . PNA vac Low Risk Adult  Completed  . HPV VACCINES  Aged Out    Health Maintenance  Health Maintenance Due  Topic Date Due  . OPHTHALMOLOGY EXAM  07/23/2018  . MAMMOGRAM  11/22/2020    Colorectal cancer screening: No longer required.   Mammogram status: Completed 11/23/19. Repeat every year  Bone Density status: Completed 11/23/19. Results reflect: Bone density results: OSTEOPENIA. Repeat every 2 years.  Lung Cancer Screening: (Low Dose CT Chest recommended if Age 77-80 years, 30 pack-year currently smoking OR have quit w/in 15years.) does not qualify.   Additional Screening:  Hepatitis C Screening: does not qualify  Vision Screening: Recommended annual ophthalmology exams for early detection of glaucoma and other disorders of the eye. Is the patient up to date with their annual eye exam?  No  Who is the provider or what is the name of the office in which the patient attends annual eye exams? [facility].   Dental Screening: Recommended annual dental exams for proper oral hygiene  Community Resource Referral / Chronic Care Management: CRR required this visit?  No   CCM required this visit?  No      Plan:     I have personally reviewed and noted the following in the patient's chart:   . Medical and social history . Use of alcohol, tobacco or illicit drugs  . Current medications and supplements including opioid prescriptions.  . Functional ability and status . Nutritional status . Physical activity . Advanced directives . List of other physicians . Hospitalizations, surgeries, and ER visits in previous 12 months . Vitals . Screenings to include  cognitive, depression, and falls . Referrals and appointments  In addition, I have reviewed and discussed with patient certain preventive protocols, quality metrics, and best practice recommendations. A written personalized care plan for preventive services as well as general preventive health recommendations were provided to patient.     [name], LPN   [id number]   Nurse Notes: none

## 2020-12-06 NOTE — Patient Instructions (Signed)
[name] , Thank you for taking time to come for your Medicare Wellness Visit. I appreciate your ongoing commitment to your health goals. Please review the following plan we discussed and let me know if I can assist you in the future.   Screening recommendations/referrals: Colonoscopy: no longer required Mammogram: done 11/23/19. Please call [phone] to schedule your mammogram.  Bone Density: done 11/23/19 Recommended yearly ophthalmology/optometry visit for glaucoma screening and checkup Recommended yearly dental visit for hygiene and checkup  Vaccinations: Influenza vaccine: done 05/16/20 Pneumococcal vaccine: done 03/26/15 Tdap vaccine: due Shingles vaccine: done 04/24/20 & 06/25/20   Covid-19:done 08/04/19, 08/25/19 & 05/29/20  Advanced directives: Advance directive discussed with you today. I have provided a copy for you to complete at home and have notarized. Once this is complete please bring a copy in to our office so we can scan it into your chart.  Conditions/risks identified: recommend continuing fall prevention in the home  Next appointment: Follow up in one year for your annual wellness visit    Preventive Care 65 Years and Older, Female Preventive care refers to lifestyle choices and visits with your health care provider that can promote health and wellness. What does preventive care include?  A yearly physical exam. This is also called an annual well check.  Dental exams once or twice a year.  Routine eye exams. Ask your health care provider how often you should have your eyes checked.  Personal lifestyle choices, including:  Daily care of your teeth and gums.  Regular physical activity.  Eating a healthy diet.  Avoiding tobacco and drug use.  Limiting alcohol use.  Practicing safe sex.  Taking low-dose aspirin every day.  Taking vitamin and mineral supplements as recommended by your health care provider. What happens during an annual well check? The  services and screenings done by your health care provider during your annual well check will depend on your age, overall health, lifestyle risk factors, and family history of disease. Counseling  Your health care provider may ask you questions about your:  Alcohol use.  Tobacco use.  Drug use.  Emotional well-being.  Home and relationship well-being.  Sexual activity.  Eating habits.  History of falls.  Memory and ability to understand (cognition).  Work and work Astronomer.  Reproductive health. Screening  You may have the following tests or measurements:  Height, weight, and BMI.  Blood pressure.  Lipid and cholesterol levels. These may be checked every 5 years, or more frequently if you are over 49 years old.  Skin check.  Lung cancer screening. You may have this screening every year starting at age 39 if you have a 30-pack-year history of smoking and currently smoke or have quit within the past 15 years.  Fecal occult blood test (FOBT) of the stool. You may have this test every year starting at age 79.  Flexible sigmoidoscopy or colonoscopy. You may have a sigmoidoscopy every 5 years or a colonoscopy every 10 years starting at age 10.  Hepatitis C blood test.  Hepatitis B blood test.  Sexually transmitted disease (STD) testing.  Diabetes screening. This is done by checking your blood sugar (glucose) after you have not eaten for a while (fasting). You may have this done every 1-3 years.  Bone density scan. This is done to screen for osteoporosis. You may have this done starting at age 70.  Mammogram. This may be done every 1-2 years. Talk to your health care provider about how often you should have  regular mammograms. Talk with your health care provider about your test results, treatment options, and if necessary, the need for more tests. Vaccines  Your health care provider may recommend certain vaccines, such as:  Influenza vaccine. This is recommended  every year.  Tetanus, diphtheria, and acellular pertussis (Tdap, Td) vaccine. You may need a Td booster every 10 years.  Zoster vaccine. You may need this after age 89.  Pneumococcal 13-valent conjugate (PCV13) vaccine. One dose is recommended after age 36.  Pneumococcal polysaccharide (PPSV23) vaccine. One dose is recommended after age 74. Talk to your health care provider about which screenings and vaccines you need and how often you need them. This information is not intended to replace advice given to you by your health care provider. Make sure you discuss any questions you have with your health care provider. Document Released: 07/27/2015 Document Revised: 03/19/2016 Document Reviewed: 05/01/2015 Elsevier Interactive Patient Education  2017 [place] Prevention in the Home Falls can cause injuries. They can happen to people of all ages. There are many things you can do to make your home safe and to help prevent falls. What can I do on the outside of my home?  Regularly fix the edges of walkways and driveways and fix any cracks.  Remove anything that might make you trip as you walk through a door, such as a raised step or threshold.  Trim any bushes or trees on the path to your home.  Use bright outdoor lighting.  Clear any walking paths of anything that might make someone trip, such as rocks or tools.  Regularly check to see if handrails are loose or broken. Make sure that both sides of any steps have handrails.  Any raised decks and porches should have guardrails on the edges.  Have any leaves, snow, or ice cleared regularly.  Use sand or salt on walking paths during winter.  Clean up any spills in your garage right away. This includes oil or grease spills. What can I do in the bathroom?  Use night lights.  Install grab bars by the toilet and in the tub and shower. Do not use towel bars as grab bars.  Use non-skid mats or decals in the tub or shower.  If  you need to sit down in the shower, use a plastic, non-slip stool.  Keep the floor dry. Clean up any water that spills on the floor as soon as it happens.  Remove soap buildup in the tub or shower regularly.  Attach bath mats securely with double-sided non-slip rug tape.  Do not have throw rugs and other things on the floor that can make you trip. What can I do in the bedroom?  Use night lights.  Make sure that you have a light by your bed that is easy to reach.  Do not use any sheets or blankets that are too big for your bed. They should not hang down onto the floor.  Have a firm chair that has side arms. You can use this for support while you get dressed.  Do not have throw rugs and other things on the floor that can make you trip. What can I do in the kitchen?  Clean up any spills right away.  Avoid walking on wet floors.  Keep items that you use a lot in easy-to-reach places.  If you need to reach something above you, use a strong step stool that has a grab bar.  Keep electrical cords out of the  way.  Do not use floor polish or wax that makes floors slippery. If you must use wax, use non-skid floor wax.  Do not have throw rugs and other things on the floor that can make you trip. What can I do with my stairs?  Do not leave any items on the stairs.  Make sure that there are handrails on both sides of the stairs and use them. Fix handrails that are broken or loose. Make sure that handrails are as long as the stairways.  Check any carpeting to make sure that it is firmly attached to the stairs. Fix any carpet that is loose or worn.  Avoid having throw rugs at the top or bottom of the stairs. If you do have throw rugs, attach them to the floor with carpet tape.  Make sure that you have a light switch at the top of the stairs and the bottom of the stairs. If you do not have them, ask someone to add them for you. What else can I do to help prevent falls?  Wear shoes  that:  Do not have high heels.  Have rubber bottoms.  Are comfortable and fit you well.  Are closed at the toe. Do not wear sandals.  If you use a stepladder:  Make sure that it is fully opened. Do not climb a closed stepladder.  Make sure that both sides of the stepladder are locked into place.  Ask someone to hold it for you, if possible.  Clearly mark and make sure that you can see:  Any grab bars or handrails.  First and last steps.  Where the edge of each step is.  Use tools that help you move around (mobility aids) if they are needed. These include:  Canes.  Walkers.  Scooters.  Crutches.  Turn on the lights when you go into a dark area. Replace any light bulbs as soon as they burn out.  Set up your furniture so you have a clear path. Avoid moving your furniture around.  If any of your floors are uneven, fix them.  If there are any pets around you, be aware of where they are.  Review your medicines with your doctor. Some medicines can make you feel dizzy. This can increase your chance of falling. Ask your doctor what other things that you can do to help prevent falls. This information is not intended to replace advice given to you by your health care provider. Make sure you discuss any questions you have with your health care provider. Document Released: 04/26/2009 Document Revised: 12/06/2015 Document Reviewed: 08/04/2014 Elsevier Interactive Patient Education  2017 [employer].

## 2020-12-11 DIAGNOSIS — Z23 Encounter for immunization: Secondary | ICD-10-CM | POA: Diagnosis not present

## 2020-12-20 DIAGNOSIS — R8290 Unspecified abnormal findings in urine: Secondary | ICD-10-CM | POA: Diagnosis not present

## 2020-12-20 DIAGNOSIS — N1832 Chronic kidney disease, stage 3b: Secondary | ICD-10-CM | POA: Diagnosis not present

## 2020-12-27 DIAGNOSIS — I10 Essential (primary) hypertension: Secondary | ICD-10-CM | POA: Diagnosis not present

## 2020-12-27 DIAGNOSIS — R600 Localized edema: Secondary | ICD-10-CM | POA: Diagnosis not present

## 2020-12-27 DIAGNOSIS — N1832 Chronic kidney disease, stage 3b: Secondary | ICD-10-CM | POA: Diagnosis not present

## 2021-01-17 ENCOUNTER — Ambulatory Visit: Payer: Medicare Other | Admitting: Family Medicine

## 2021-01-22 NOTE — Progress Notes (Addendum)
Name: Melanie Gray   MRN: [id number]    DOB: 06/12/1941   Date:01/23/2021       Progress Note  Subjective  Chief Complaint  Follow Up  HPI  HTN: she is now on Micardis 40 mg and nephrologist gave her Amlodipine 2.5 mg and bp is still towards low end of normal but improved, no longer going down to 100/60, usually 115-120 at home. She denies orthostatic hypotension . She has CKI stage  3   Diabetes with renal manifestation: GFR stable, on ARB, she sees a nephrologist. she denies polyphagia, polydipsia or polyuria but has urinary frequency and nocturia. . She follows a diabetic diet. HgbA1C is still at goal, today was 5.2% Glucose at home is checked weekly and between 90's-110's , she has diabetic neuropathy, failed monofilament test in the past, under the care of Podiatrist.   Cyst on face: she noticed a knot on face for years, but recently started to grown and is bothering her. No pain or increase in warmth, no drainage or redness.    Hyperlipidemia: taking Pravastatin, denies myalgia , compliant with medication, reviewed labs labs and LDL 96, discussed goal is below 70, she is willing to try switching to Rosuvastatin 20 mg    Major Depression: She denies being depressed, she states she is an introvert, she is socializing over the phone. Her son has moved in with her early 2022 because she was afraid after her fall in 2021. She is happy her son is here helping her out    Varicose veins:  she wears compression stocking hoses now and seems to be helping with lower extremity edema.    Urge incontinence: she drinks coffee and tea, advised to cut down on caffeine,  I gave her ditropan last visit but she never took it, she was afraid of side effects.  Symptoms are stable    OA knee: history of arthroscopic surgery of right knee , she still uses a cane and walks slowly. Right knee has mild effusion intermittently , she walks very slowly , sometimes pain goes down to lateral lower leg, we will give her  topical medication to use prn   Patient Active Problem List   Diagnosis Date Noted   Pain due to onychomycosis of toenails of both feet 08/30/2020   Injury of toenail of right foot 08/30/2020   Edema of lower extremity 12/05/2019   Depression, major, recurrent, in remission (HCC) 08/10/2017   Varicose veins of both lower extremities with inflammation 12/01/2016   Chronic venous insufficiency 12/01/2016   Pain in limb 12/01/2016   Benign hypertension 11/06/2014   Chronic anemia 11/06/2014   Seborrheic dermatitis 11/06/2014   Dyslipidemia 11/06/2014   Cephalalgia 11/06/2014   Deafness, sensorineural 11/06/2014   Arthritis of knee, degenerative 11/06/2014   Panic attack 11/06/2014   Restless legs syndrome 11/06/2014   Allergic rhinitis 11/06/2014   Spinal stenosis 11/06/2014   Osteopenia 05/09/2009   Chronic kidney disease (CKD), stage III (moderate) (HCC) 04/20/2008    Past Surgical History:  Procedure Laterality Date   ABDOMINAL HYSTERECTOMY     CATARACT EXTRACTION W/PHACO Left 07/16/2017   Procedure: CATARACT EXTRACTION PHACO AND INTRAOCULAR LENS PLACEMENT (IOC);  Surgeon: [name], MD;  Location: [facility] ORS;  Service: Ophthalmology;  Laterality: Left;  [place] 01:27.0 AP% 21.0 CDE 18.28 Fluid Pack Lot # [id number]   CHOLECYSTECTOMY     HERNIA REPAIR      Family History  Problem Relation Age of Onset   Cancer Mother  Diabetes Father    Cancer Sister    Breast cancer Neg Hx     Social History   Tobacco Use   Smoking status: Never   Smokeless tobacco: Never   Tobacco comments:    smoking cessation materials not required  Substance Use Topics   Alcohol use: No    Alcohol/week: 0.0 standard drinks     Current Outpatient Medications:    acetaminophen (TYLENOL) 500 MG tablet, Take 1,000 mg by mouth daily as needed for moderate pain or headache., Disp: , Rfl:    amLODipine (NORVASC) 2.5 MG tablet, Take 1 tablet (2.5 mg total) by mouth daily., Disp: 90 tablet,  Rfl: 1   aspirin EC 81 MG tablet, Take 81 mg by mouth daily., Disp: , Rfl:    Cholecalciferol (VITAMIN D) 2000 UNITS tablet, Take 2,000 Units by mouth every evening. , Disp: , Rfl:    Glucosamine-Chondroitin (OSTEO BI-FLEX REGULAR STRENGTH PO), Take 1 tablet by mouth daily., Disp: , Rfl:    Multiple Vitamin (MULTI-VITAMINS) TABS, Take 1 tablet by mouth every evening. , Disp: , Rfl:    Polyethyl Glycol-Propyl Glycol (SYSTANE OP), Apply 1 drop to eye daily as needed (dry eyes)., Disp: , Rfl:    pravastatin (PRAVACHOL) 40 MG tablet, TAKE 1 TABLET BY MOUTH EVERY DAY, Disp: 90 tablet, Rfl: 3   telmisartan (MICARDIS) 40 MG tablet, Take 1 tablet (40 mg total) by mouth daily., Disp: 90 tablet, Rfl: 1  Allergies  Allergen Reactions   Sulfa Antibiotics Rash    I personally reviewed active problem list, medication list, allergies, family history, social history, health maintenance with the patient/caregiver today.   ROS  Constitutional: Negative for fever or weight change.  Respiratory: Negative for cough and shortness of breath.   Cardiovascular: Negative for chest pain or palpitations.  Gastrointestinal: Negative for abdominal pain, no bowel changes.  Musculoskeletal: positive or gait problem and intermittent joint swelling.  Skin: Negative for rash.  Neurological: Negative for dizziness or headache.  No other specific complaints in a complete review of systems (except as listed in HPI above).   Objective  Vitals:   01/23/21 0959  BP: 114/70  Pulse: 87  Resp: 16  Temp: 97.9 F (36.6 C)  TempSrc: Oral  SpO2: 99%  Weight: 143 lb (64.9 kg)  Height: 5\' 6"  (1.676 m)    Body mass index is 23.08 kg/m.  Physical Exam  Constitutional: Patient appears well-developed and well-nourished. No distress.  HEENT: head atraumatic, normocephalic, pupils equal and reactive to light, neck supple Cardiovascular: Normal rate, regular rhythm and normal heart sounds.  No murmur heard. No BLE  edema. Pulmonary/Chest: Effort normal and breath sounds normal. No respiratory distress. Abdominal: Soft.  There is no tenderness. Muscular skeletal: decrease extension of both knees, mild effusion right knee, using cane, walks slowly  Skin: cyst on right side of face, sebaceous, she gave me a verbal consent to drain it, she tolerated procedure well, no complications Psychiatric: Patient has a normal mood and affect. behavior is normal. Judgment and thought content normal.   Recent Results (from the past 2160 hour(s))  POCT HgB A1C     Status: None   Collection Time: 01/23/21 10:03 AM  Result Value Ref Range   Hemoglobin A1C 5.2 4.0 - 5.6 %   HbA1c POC (<> result, manual entry)     HbA1c, POC (prediabetic range)     HbA1c, POC (controlled diabetic range)       PHQ2/9: Depression screen [facility] 2/9  01/23/2021 12/06/2020 09/17/2020 05/16/2020 05/16/2020  Decreased Interest 0 0 0 1 1  Down, Depressed, Hopeless 0 0 0 0 0  PHQ - 2 Score 0 0 0 1 1  Altered sleeping - - - 0 -  Tired, decreased energy - - - 1 -  Change in appetite - - - 0 -  Feeling bad or failure about yourself  - - - 0 -  Trouble concentrating - - - 0 -  Moving slowly or fidgety/restless - - - 0 -  Suicidal thoughts - - - 0 -  PHQ-9 Score - - - 2 -  Difficult doing work/chores - - - Not difficult at all -  Some recent data might be hidden    phq 9 is negative   Fall Risk: Fall Risk  01/23/2021 12/06/2020 09/17/2020 05/16/2020 11/15/2019  Falls in the past year? 0 0 1 1 0  Number falls in past yr: 0 0 0 1 0  Comment - - - - -  Injury with Fall? 0 0 1 1 0  Comment - - - - -  Risk for fall due to : - Impaired balance/gait History of fall(s);Impaired balance/gait History of fall(s) Impaired balance/gait  Risk for fall due to: Comment - - - - -  Follow up - Falls prevention discussed Falls prevention discussed - Falls prevention discussed      Functional Status Survey: Is the patient deaf or have difficulty hearing?: No Does  the patient have difficulty seeing, even when wearing glasses/contacts?: No Does the patient have difficulty concentrating, remembering, or making decisions?: No Does the patient have difficulty walking or climbing stairs?: Yes Does the patient have difficulty dressing or bathing?: Yes Does the patient have difficulty doing errands alone such as visiting a doctor's office or shopping?: No    Assessment & Plan  1. History of diabetes mellitus, type II  - POCT HgB A1C  2. Chronic kidney insufficiency, stage 3 (moderate) (HCC)  - telmisartan (MICARDIS) 40 MG tablet; Take 1 tablet (40 mg total) by mouth daily.  Dispense: 90 tablet; Refill: 1  3. Benign hypertension  - telmisartan (MICARDIS) 40 MG tablet; Take 1 tablet (40 mg total) by mouth daily.  Dispense: 90 tablet; Refill: 1  4. Dyslipidemia  - rosuvastatin (CRESTOR) 20 MG tablet; Take 1 tablet (20 mg total) by mouth daily. In place of pravastatin for cholesterol  Dispense: 90 tablet; Refill: 1  5. Depression, major, recurrent, in remission (HCC)   6. Benign hypertension with chronic kidney disease, stage III (HCC)   7. Primary osteoarthritis of both knees  - diclofenac Sodium (VOLTAREN) 1 % GEL; Apply 4 g topically 4 (four) times daily.  Dispense: 300 g; Refill: 1  8. Type 2 diabetes mellitus with stage 3a chronic kidney disease, without long-term current use of insulin (HCC)   9. Varicose veins of leg with pain, right   10. Sebaceous cyst  - Ambulatory referral to Dermatology   Verbal consent given Area cleaned with alcohol on right lower side of face Pressure applied and large amount of sebum extracted, size got significantly smaller  No complications

## 2021-01-23 ENCOUNTER — Other Ambulatory Visit: Payer: Self-pay

## 2021-01-23 ENCOUNTER — Ambulatory Visit (INDEPENDENT_AMBULATORY_CARE_PROVIDER_SITE_OTHER): Payer: Medicare Other | Admitting: Family Medicine

## 2021-01-23 ENCOUNTER — Encounter: Payer: Self-pay | Admitting: Family Medicine

## 2021-01-23 VITALS — BP 114/70 | HR 87 | Temp 97.9°F | Resp 16 | Ht 66.0 in | Wt 143.0 lb

## 2021-01-23 DIAGNOSIS — N183 Chronic kidney disease, stage 3 unspecified: Secondary | ICD-10-CM | POA: Diagnosis not present

## 2021-01-23 DIAGNOSIS — N1831 Type 2 diabetes mellitus with diabetic chronic kidney disease: Secondary | ICD-10-CM

## 2021-01-23 DIAGNOSIS — L723 Sebaceous cyst: Secondary | ICD-10-CM | POA: Diagnosis not present

## 2021-01-23 DIAGNOSIS — F3340 Major depressive disorder, recurrent, in remission, unspecified: Secondary | ICD-10-CM | POA: Diagnosis not present

## 2021-01-23 DIAGNOSIS — Z8639 Personal history of other endocrine, nutritional and metabolic disease: Secondary | ICD-10-CM | POA: Diagnosis not present

## 2021-01-23 DIAGNOSIS — E785 Hyperlipidemia, unspecified: Secondary | ICD-10-CM | POA: Diagnosis not present

## 2021-01-23 DIAGNOSIS — E1122 Type 2 diabetes mellitus with diabetic chronic kidney disease: Secondary | ICD-10-CM | POA: Diagnosis not present

## 2021-01-23 DIAGNOSIS — I83811 Varicose veins of right lower extremities with pain: Secondary | ICD-10-CM

## 2021-01-23 DIAGNOSIS — I129 Hypertensive chronic kidney disease with stage 1 through stage 4 chronic kidney disease, or unspecified chronic kidney disease: Secondary | ICD-10-CM

## 2021-01-23 DIAGNOSIS — M170 Bilateral primary osteoarthritis of knee: Secondary | ICD-10-CM | POA: Diagnosis not present

## 2021-01-23 DIAGNOSIS — I10 Essential (primary) hypertension: Secondary | ICD-10-CM | POA: Diagnosis not present

## 2021-01-23 LAB — POCT GLYCOSYLATED HEMOGLOBIN (HGB A1C): Hemoglobin A1C: 5.2 % (ref 4.0–5.6)

## 2021-01-23 MED ORDER — DICLOFENAC SODIUM 1 % EX GEL: 4.0000 g | Freq: Four times a day (QID) | CUTANEOUS | 1 refills | Status: AC

## 2021-01-23 MED ORDER — ROSUVASTATIN CALCIUM 20 MG PO TABS: 20.0000 mg | ORAL_TABLET | Freq: Every day | ORAL | 1 refills | Status: DC

## 2021-01-23 MED ORDER — TELMISARTAN 40 MG PO TABS: 40.0000 mg | ORAL_TABLET | Freq: Every day | ORAL | 1 refills | Status: DC

## 2021-01-23 NOTE — Addendum Note (Signed)
Addended by: Alba Cory F on: 01/23/2021 10:41 AM   Modules accepted: Orders

## 2021-03-07 ENCOUNTER — Ambulatory Visit: Payer: Medicare Other | Admitting: Podiatry

## 2021-03-25 ENCOUNTER — Encounter (INDEPENDENT_AMBULATORY_CARE_PROVIDER_SITE_OTHER): Payer: Self-pay

## 2021-03-25 ENCOUNTER — Ambulatory Visit (INDEPENDENT_AMBULATORY_CARE_PROVIDER_SITE_OTHER): Payer: Medicare Other | Admitting: Podiatry

## 2021-03-25 ENCOUNTER — Other Ambulatory Visit: Payer: Self-pay

## 2021-03-25 ENCOUNTER — Encounter: Payer: Self-pay | Admitting: Podiatry

## 2021-03-25 DIAGNOSIS — I872 Venous insufficiency (chronic) (peripheral): Secondary | ICD-10-CM

## 2021-03-25 DIAGNOSIS — E1122 Type 2 diabetes mellitus with diabetic chronic kidney disease: Secondary | ICD-10-CM

## 2021-03-25 DIAGNOSIS — N183 Chronic kidney disease, stage 3 unspecified: Secondary | ICD-10-CM

## 2021-03-25 DIAGNOSIS — B351 Tinea unguium: Secondary | ICD-10-CM

## 2021-03-25 DIAGNOSIS — M79675 Pain in left toe(s): Secondary | ICD-10-CM

## 2021-03-25 DIAGNOSIS — M79674 Pain in right toe(s): Secondary | ICD-10-CM

## 2021-03-25 LAB — HM DIABETES FOOT EXAM: HM Diabetic Foot Exam: NORMAL

## 2021-03-25 NOTE — Progress Notes (Signed)
This patient returns to my office for at risk foot care.  This patient requires this care by a professional since this patient will be at risk due to having chronic kidney disease and chronic venous insufficiency.  This patient is unable to cut nails herself since the patient cannot reach her nails.These nails are painful walking and wearing shoes.  This patient presents for at risk foot care today.  General Appearance  Alert, conversant and in no acute stress.  Vascular  Dorsalis pedis and posterior tibial  pulses are weakly  palpable  bilaterally.  Capillary return is within normal limits  bilaterally. Temperature is within normal limits  bilaterally.  Neurologic  Senn-Weinstein monofilament wire test within normal limits  bilaterally. Muscle power within normal limits bilaterally.  Nails Thick disfigured discolored nails with subungual debris  from hallux to fifth toes bilaterally. No evidence of bacterial infection or drainage bilaterally.  Orthopedic  No limitations of motion  feet .  No crepitus or effusions noted.  No bony pathology or digital deformities noted.  Skin  normotropic skin with no porokeratosis noted bilaterally.  No signs of infections or ulcers noted.   Clavi 3rd toe left foot.  Onychomycosis  Pain in right toes  Pain in left toes  Clavi 3rd toe left foot.  Consent was obtained for treatment procedures.   Mechanical debridement of nails 1-5  bilaterally performed with a nail nipper.  Filed with dremel without incident.  Debride clavi with # 15 blade.  Dispense padding.   Return office visit  3 months                   Told patient to return for periodic foot care and evaluation due to potential at risk complications.   Helane Gunther DPM

## 2021-03-28 ENCOUNTER — Other Ambulatory Visit: Payer: Self-pay | Admitting: Family Medicine

## 2021-03-28 DIAGNOSIS — I10 Essential (primary) hypertension: Secondary | ICD-10-CM

## 2021-06-25 DIAGNOSIS — I10 Essential (primary) hypertension: Secondary | ICD-10-CM | POA: Diagnosis not present

## 2021-06-25 DIAGNOSIS — R600 Localized edema: Secondary | ICD-10-CM | POA: Diagnosis not present

## 2021-06-25 DIAGNOSIS — N1832 Chronic kidney disease, stage 3b: Secondary | ICD-10-CM | POA: Diagnosis not present

## 2021-06-27 ENCOUNTER — Ambulatory Visit: Payer: Medicare Other | Admitting: Podiatry

## 2021-07-01 DIAGNOSIS — R600 Localized edema: Secondary | ICD-10-CM | POA: Diagnosis not present

## 2021-07-01 DIAGNOSIS — I10 Essential (primary) hypertension: Secondary | ICD-10-CM | POA: Diagnosis not present

## 2021-07-01 DIAGNOSIS — N1832 Chronic kidney disease, stage 3b: Secondary | ICD-10-CM | POA: Diagnosis not present

## 2021-07-01 DIAGNOSIS — N1831 Chronic kidney disease, stage 3a: Secondary | ICD-10-CM | POA: Diagnosis not present

## 2021-07-26 ENCOUNTER — Ambulatory Visit: Payer: Medicare Other | Admitting: Family Medicine

## 2021-07-30 ENCOUNTER — Encounter: Payer: Self-pay | Admitting: Nurse Practitioner

## 2021-07-30 ENCOUNTER — Ambulatory Visit (INDEPENDENT_AMBULATORY_CARE_PROVIDER_SITE_OTHER): Payer: Medicare Other | Admitting: Nurse Practitioner

## 2021-07-30 ENCOUNTER — Ambulatory Visit: Payer: Self-pay | Admitting: *Deleted

## 2021-07-30 ENCOUNTER — Other Ambulatory Visit: Payer: Self-pay

## 2021-07-30 DIAGNOSIS — U071 COVID-19: Secondary | ICD-10-CM | POA: Diagnosis not present

## 2021-07-30 MED ORDER — MOLNUPIRAVIR EUA 200MG CAPSULE: 4.0000 | ORAL_CAPSULE | Freq: Two times a day (BID) | ORAL | 0 refills | Status: AC

## 2021-07-30 NOTE — Progress Notes (Signed)
Name: Melanie Gray   MRN: [id number]    DOB: 12-Jul-1941   Date:07/30/2021       Progress Note  Subjective  Chief Complaint  Chief Complaint  Patient presents with   Covid Positive    Weak, congested, scratchy throat,    I connected with  Darcel Smalling  on 07/30/21 at 3:00 pm by a telephone enabled telemedicine application and verified that I am speaking with the correct person using two identifiers.  I discussed the limitations of evaluation and management by telemedicine and the availability of in person appointments. The patient expressed understanding and agreed to proceed with a virtual visit  Staff also discussed with the patient that there may be a patient responsible charge related to this service. Patient Location: home Provider Location: [facility] Additional Individuals present: alone  HPI  Covid-19: She says that her symptoms started Saturday. She says she has fatigue, sore throat and some congestion in her throat.  She denies any nasal congestion, cough, shortness of breath or fever.  She says she took a Covid test yesterday and it was positive. She would like to discuss the Covid treatment.  Discussed side effects and benefits of the medication.  She is agreeable to take molnupiravir.  Discussed OTC treatments to take for symptoms.  Discussed taking vitamin D, vitamin C and zinc.  Push fluids and get rest.  She is agreeable to plan.   Patient Active Problem List   Diagnosis Date Noted   Pain due to onychomycosis of toenails of both feet 08/30/2020   Injury of toenail of right foot 08/30/2020   Edema of lower extremity 12/05/2019   Depression, major, recurrent, in remission ([place]) 08/10/2017   Varicose veins of both lower extremities with inflammation 12/01/2016   Chronic venous insufficiency 12/01/2016   Pain in limb 12/01/2016   Benign hypertension 11/06/2014   Chronic anemia 11/06/2014   Seborrheic dermatitis 11/06/2014   Dyslipidemia 11/06/2014   Cephalalgia 11/06/2014    Deafness, sensorineural 11/06/2014   Arthritis of knee, degenerative 11/06/2014   Panic attack 11/06/2014   Restless legs syndrome 11/06/2014   Allergic rhinitis 11/06/2014   Spinal stenosis 11/06/2014   Osteopenia 05/09/2009   Chronic kidney disease (CKD), stage III (moderate) ([place]) 04/20/2008    Social History   Tobacco Use   Smoking status: Never   Smokeless tobacco: Never   Tobacco comments:    smoking cessation materials not required  Substance Use Topics   Alcohol use: No    Alcohol/week: 0.0 standard drinks     Current Outpatient Medications:    acetaminophen (TYLENOL) 500 MG tablet, Take 1,000 mg by mouth daily as needed for moderate pain or headache., Disp: , Rfl:    amLODipine (NORVASC) 2.5 MG tablet, TAKE 1 TABLET BY MOUTH EVERY DAY, Disp: 90 tablet, Rfl: 1   aspirin EC 81 MG tablet, Take 81 mg by mouth daily., Disp: , Rfl:    Cholecalciferol (VITAMIN D) 2000 UNITS tablet, Take 2,000 Units by mouth every evening. , Disp: , Rfl:    diclofenac Sodium (VOLTAREN) 1 % GEL, Apply 4 g topically 4 (four) times daily., Disp: 300 g, Rfl: 1   Glucosamine-Chondroitin (OSTEO BI-FLEX REGULAR STRENGTH PO), Take 1 tablet by mouth daily., Disp: , Rfl:    Multiple Vitamin (MULTI-VITAMINS) TABS, Take 1 tablet by mouth every evening. , Disp: , Rfl:    Polyethyl Glycol-Propyl Glycol (SYSTANE OP), Apply 1 drop to eye daily as needed (dry eyes)., Disp: , Rfl:  rosuvastatin (CRESTOR) 20 MG tablet, Take 1 tablet (20 mg total) by mouth daily. In place of pravastatin for cholesterol, Disp: 90 tablet, Rfl: 1   telmisartan (MICARDIS) 40 MG tablet, Take 1 tablet (40 mg total) by mouth daily., Disp: 90 tablet, Rfl: 1  Allergies  Allergen Reactions   Sulfa Antibiotics Rash    I personally reviewed active problem list, medication list, allergies, notes from last encounter with the patient/caregiver today.  ROS  Constitutional: Negative for fever or weight change.  HEENT: positive for sore  throat and throat congestion Respiratory: negative for cough, negative for shortness of breath.   Cardiovascular: Negative for chest pain or palpitations.  Gastrointestinal: Negative for abdominal pain, no bowel changes.  Musculoskeletal: Negative for gait problem or joint swelling.  Skin: Negative for rash.  Neurological: Negative for dizziness or headache.  No other specific complaints in a complete review of systems (except as listed in HPI above).   Objective  Virtual encounter, vitals not obtained.  There is no height or weight on file to calculate BMI.  Nursing Note and Vital Signs reviewed.  Physical Exam  Awake, alert and oriented, speaking in complete sentences  No results found for this or any previous visit (from the past 72 hour(s)).  Assessment & Plan  1. COVID-19 -vitamin D, C and Zinc -mucinex, throat lozenges -push fluids and get rest - molnupiravir EUA (LAGEVRIO) 200 mg CAPS capsule; Take 4 capsules (800 mg total) by mouth 2 (two) times daily for 5 days.  Dispense: 40 capsule; Refill: 0   -Red flags and when to present for emergency care or RTC including fever >101.62F, chest pain, shortness of breath, new/worsening/un-resolving symptoms, reviewed with patient at time of visit. Follow up and care instructions discussed and provided in AVS. - I discussed the assessment and treatment plan with the patient. The patient was provided an opportunity to ask questions and all were answered. The patient agreed with the plan and demonstrated an understanding of the instructions.  I provided 20 minutes of non-face-to-face time during this encounter.  Bo Merino, FNP

## 2021-07-30 NOTE — Telephone Encounter (Signed)
°  Chief Complaint: + COVID Symptoms: fatigue, sinus drainage Frequency: symptoms started over the week end Pertinent Negatives: Patient denies fever, SOB Disposition: [] ED /[] Urgent Care (no appt availability in office) / [x] Appointment(In office/virtual)/ []  [place] [facility]/ [] Home Care/ [] Refused Recommended Disposition /[]  Mobile Bus/ []  Follow-up with PCP Additional Notes: + COVID, moderate risk , virtual appointment scheduled- please call on home phone- patient does not use her cell phone

## 2021-07-30 NOTE — Telephone Encounter (Signed)
Summary: Positive Covid asking for medication   Patient called in to inform Dr Ancil Boozer that on 07/29/2021 she checked herself and did test positive for Covid. Asking for medication. Say that she felt tired and just wanted to lay down, having a feeling of mucus in the throat and a few bouts of diarrhea this morning. Per patient she feel fine but would like to be prescribed medication so she does not get any worst. Please call  Ph# [phone]      Reason for Disposition  [1] HIGH RISK for severe COVID complications (e.g., weak immune system, age > 6 years, obesity with BMI > 25, pregnant, chronic lung disease or other chronic medical condition) AND [2] COVID symptoms (e.g., cough, fever)  (Exceptions: Already seen by PCP and no new or worsening symptoms.)  Answer Assessment - Initial Assessment Questions 1. COVID-19 DIAGNOSIS: "Who made your COVID-19 diagnosis?" "Was it confirmed by a positive lab test or self-test?" If not diagnosed by a doctor (or NP/PA), ask "Are there lots of cases (community spread) where you live?" Note: See public health department website, if unsure.     + COVID home test last night 2. COVID-19 EXPOSURE: "Was there any known exposure to COVID before the symptoms began?" CDC Definition of close contact: within 6 feet (2 meters) for a total of 15 minutes or more over a 24-hour period.      Home exposure 3. ONSET: "When did the COVID-19 symptoms start?"      Symptoms started several days ago- fatigue 4. WORST SYMPTOM: "What is your worst symptom?" (e.g., cough, fever, shortness of breath, muscle aches)     Fatigue 5. COUGH: "Do you have a cough?" If Yes, ask: "How bad is the cough?"       No- just drainage 6. FEVER: "Do you have a fever?" If Yes, ask: "What is your temperature, how was it measured, and when did it start?"     no 7. RESPIRATORY STATUS: "Describe your breathing?" (e.g., shortness of breath, wheezing, unable to speak)      normal 8. BETTER-SAME-WORSE: "Are  you getting better, staying the same or getting worse compared to yesterday?"  If getting worse, ask, "In what way?"     Some better-sitting up today 9. HIGH RISK DISEASE: "Do you have any chronic medical problems?" (e.g., asthma, heart or lung disease, weak immune system, obesity, etc.)     Age, high BP 10. VACCINE: "Have you had the COVID-19 vaccine?" If Yes, ask: "Which one, how many shots, when did you get it?"       Yes-pfizer  11. BOOSTER: "Have you received your COVID-19 booster?" If Yes, ask: "Which one and when did you get it?"       Yes- over 6 months 12. PREGNANCY: "Is there any chance you are pregnant?" "When was your last menstrual period?"       *No Answer* 13. OTHER SYMPTOMS: "Do you have any other symptoms?"  (e.g., chills, fatigue, headache, loss of smell or taste, muscle pain, sore throat)       Fatigue, throat irritation- sinus drainage 14. O2 SATURATION MONITOR:  "Do you use an oxygen saturation monitor (pulse oximeter) at home?" If Yes, ask "What is your reading (oxygen level) today?" "What is your usual oxygen saturation reading?" (e.g., 95%)       *No Answer*  Protocols used: Coronavirus (COVID-19) Diagnosed or Suspected-A-AH

## 2021-07-30 NOTE — Telephone Encounter (Signed)
Saw Melanie Gray

## 2021-07-31 ENCOUNTER — Ambulatory Visit: Payer: PRIVATE HEALTH INSURANCE | Admitting: Dermatology

## 2021-08-11 IMAGING — MG DIGITAL SCREENING BILAT W/ TOMO W/ CAD
6 of 12 series · 6 of 36 positions shown · non-contrast
Comparison: Previous exam(s).

CLINICAL DATA: Screening.

EXAM:
DIGITAL SCREENING BILATERAL MAMMOGRAM WITH TOMO AND CAD

[R CC synth-2D]
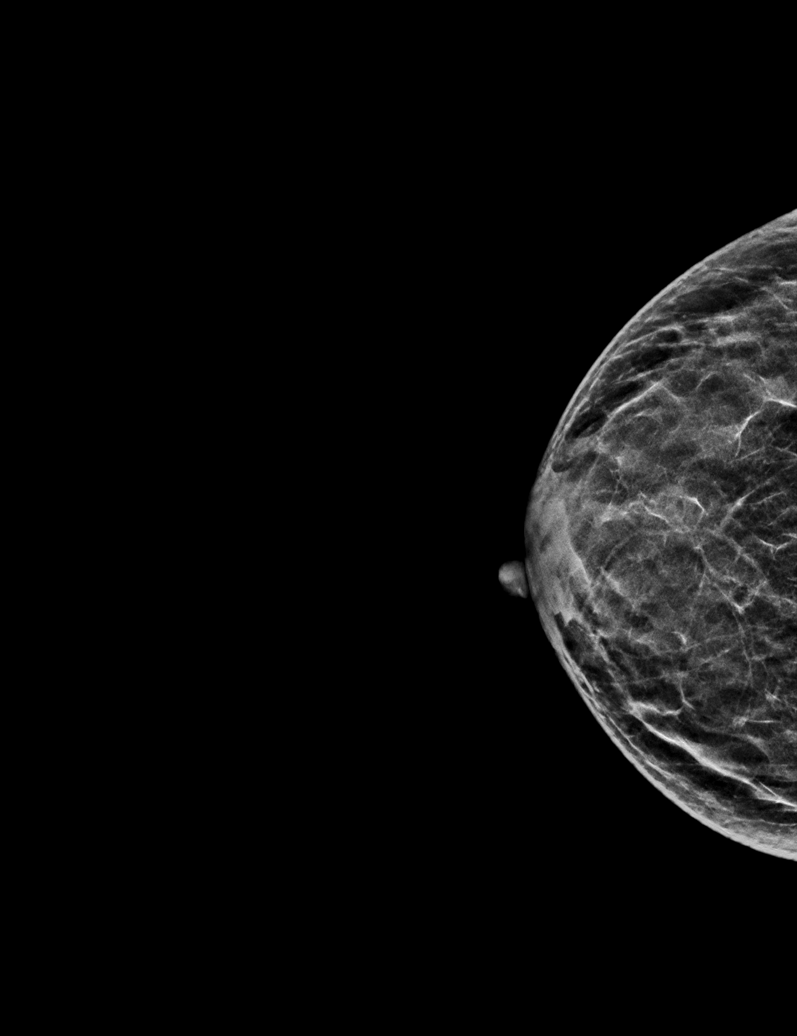

[L MLO synth-2D]
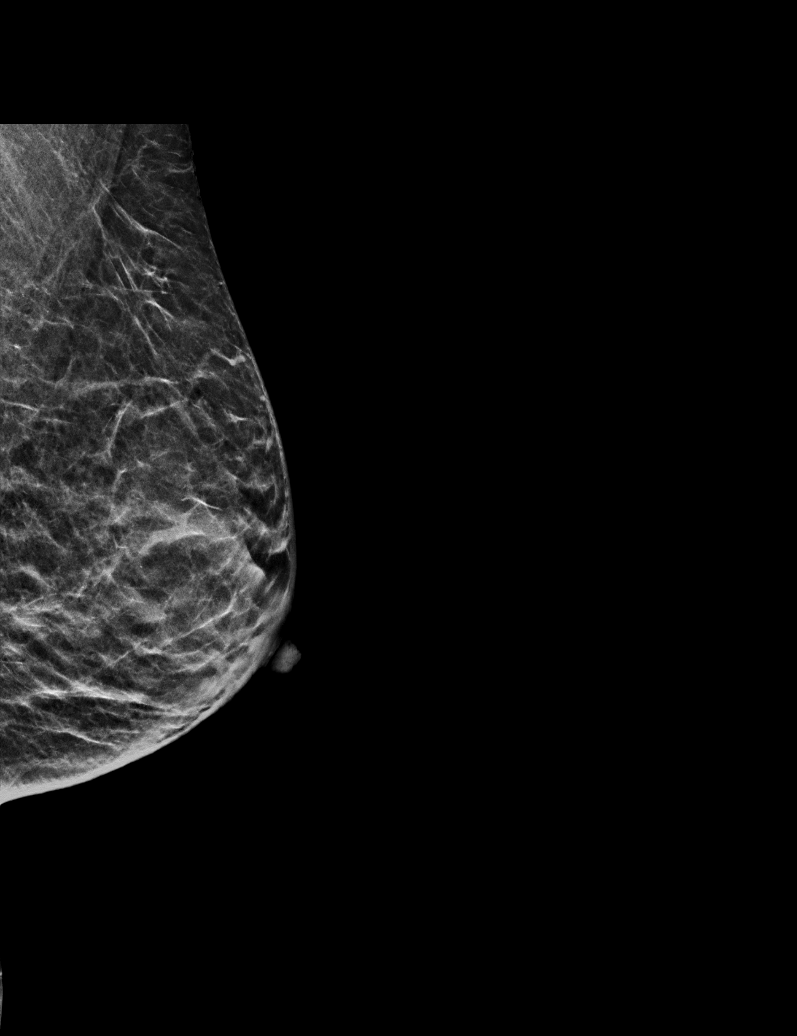

[L CC synth-2D (1 of 3)]
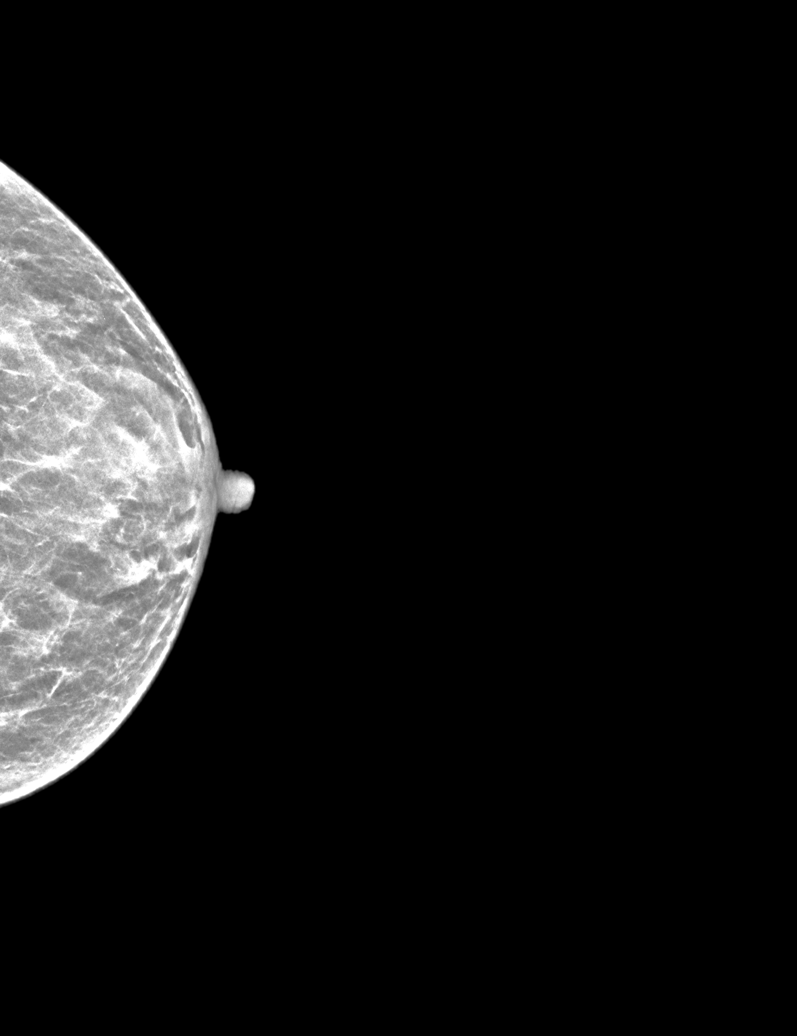

[R MLO synth-2D]
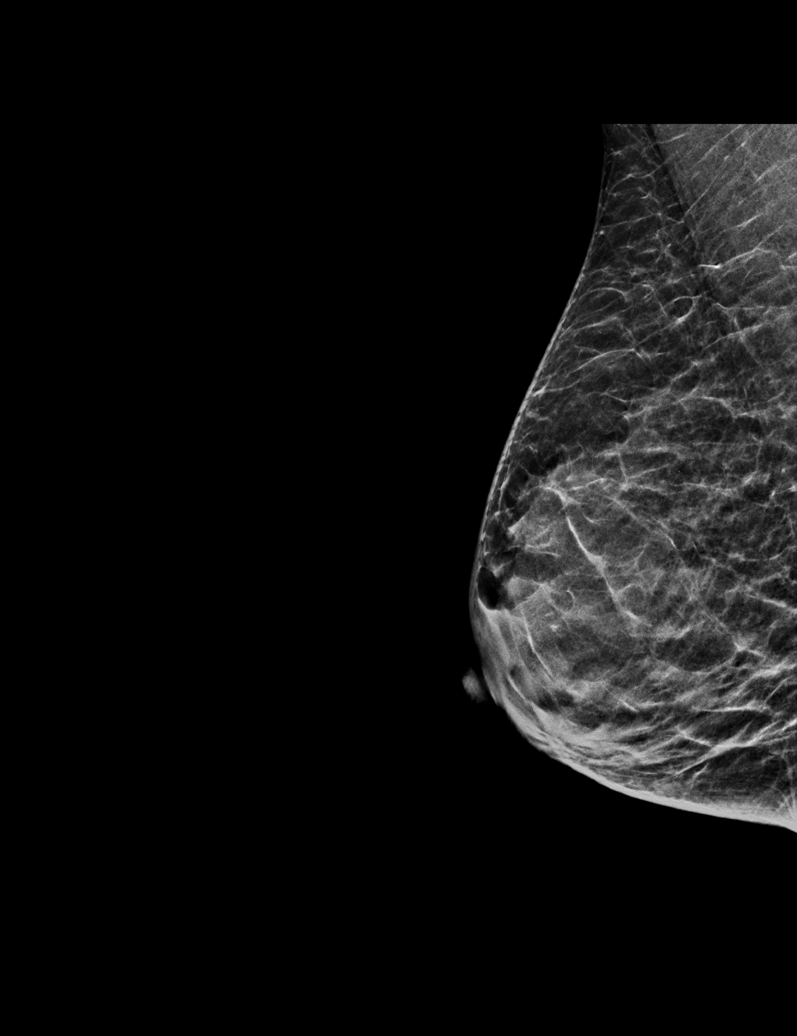

[L CC synth-2D (2 of 3)]
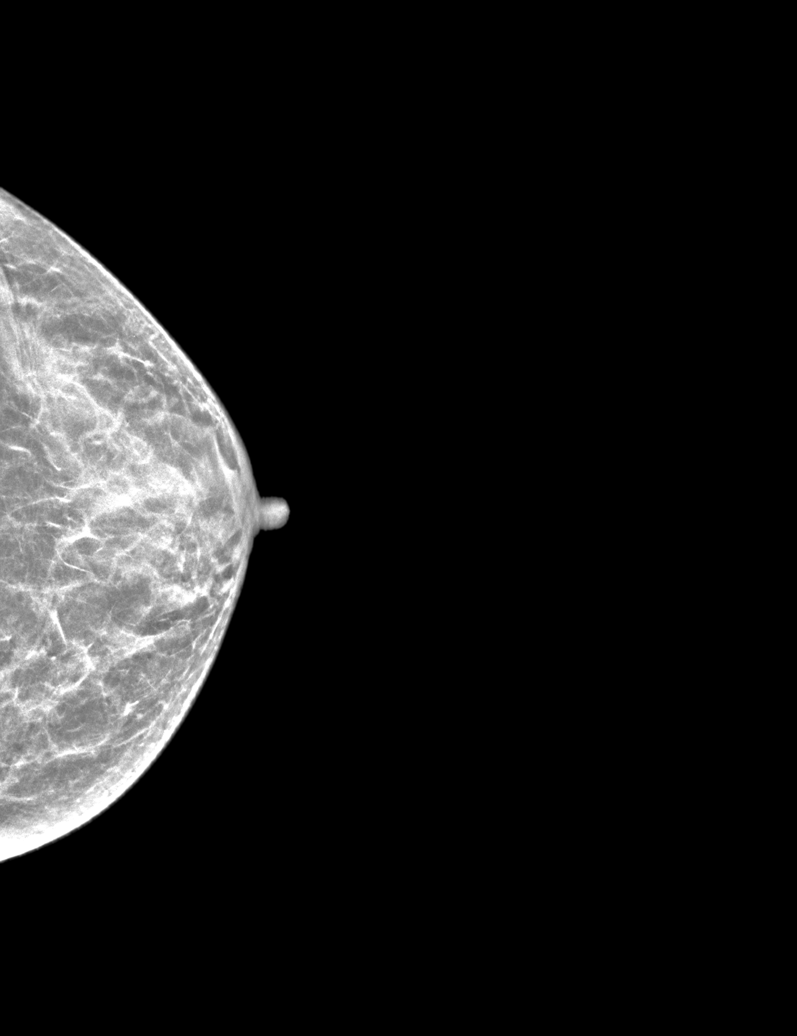

[L CC synth-2D (3 of 3)]
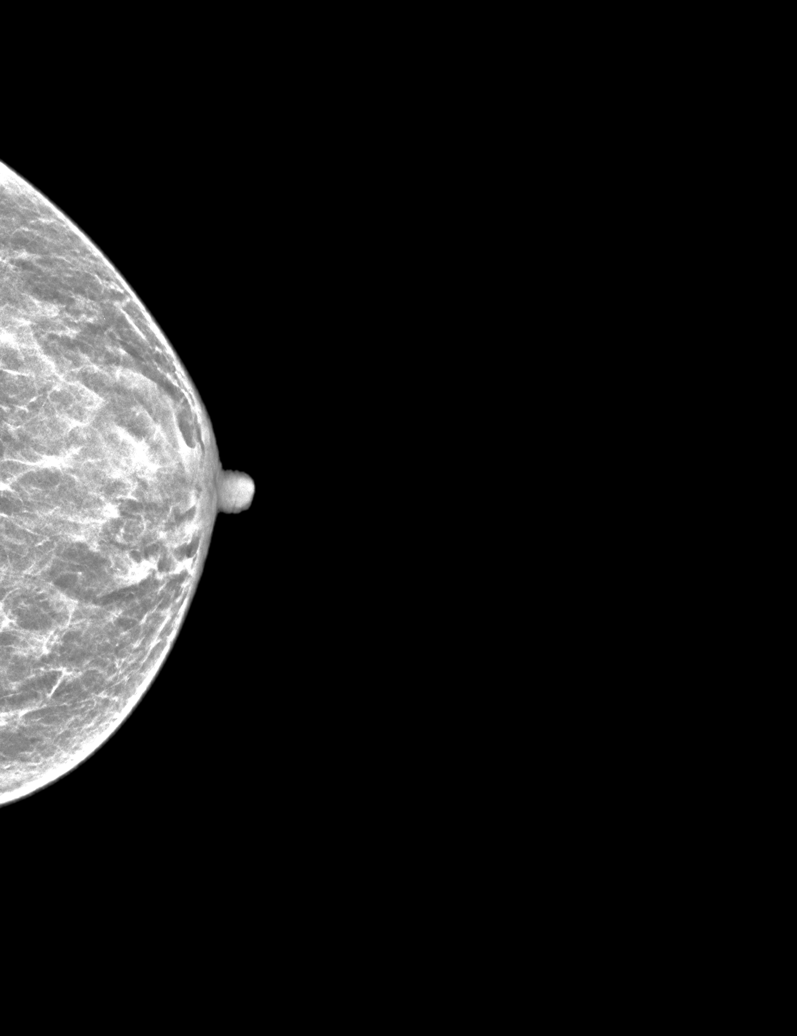

[6 of 36 positions shown; findings below may reference images not displayed]

ACR Breast Density Category c: The breast tissue is heterogeneously
dense, which may obscure small masses.
FINDINGS: There are no findings suspicious for malignancy. Images were
processed with CAD.
IMPRESSION: No mammographic evidence of malignancy. A result letter of this
screening mammogram will be mailed directly to the patient.

RECOMMENDATION:
Screening mammogram in one year. (Code:[id number]-[id number])

BI-RADS CATEGORY  1: Negative.

## 2021-08-27 NOTE — Progress Notes (Signed)
Name: Melanie Gray   MRN: [id number]    DOB: 1940/08/20   Date:08/28/2021       Progress Note  Subjective  Chief Complaint  Follow Up  HPI  HTN: she is now on Micardis 40 mg and nephrologist gave her Amlodipine 2.5 mg and bp is still towards low end of normal but improved, no longer going down to 100/60, usually 115-120 at home. She denies orthostatic hypotension . She has CKI stage  3   Diabetes with renal manifestation: GFR stable, on ARB, she sees a nephrologist last urine micro was done 06/2021 and it was normal. She denies polyphagia, polydipsia or polyuria but has urinary frequency and nocturia. She follows a diabetic diet. HgbA1C has been within normal limits. Glucose at home is checked weekly and between 90's-110's , she has diabetic neuropathy, failed monofilament test in the past, under the care of Podiatrist, last visit was 09/22.   Hyperlipidemia: she is currently taking Rosuvastatin and we will recheck labs today, no side effects    Major Depression: she had depression on an off throughout her life, really struggled after her husband died and she lost weight, had lack of motivation for years, but she has been doing well now, she states she is an introvert, she is socializing over the phone. Her son has moved in with her early 2022 because she was afraid after her fall in 2021. She had COVID-19 in January and was feeling tired but finally feeling her normal self.    Varicose veins:  she wears compression stocking hoses now and seems to be helping with lower extremity edema. Unchanged    Urge incontinence: she drinks coffee and tea, advised to cut down on caffeine,  I gave her ditropan last visit but she never took it, she was afraid of side effects.  Symptoms are stable    OA knee: history of arthroscopic surgery of right knee , she still uses a cane and walks slowly. Right knee has mild effusion intermittently , she walks very slowly , sometimes pain goes down to lateral lower leg,  she is using topical medication prn   History of COVID-19: January 2023, she had COVID-19 and lack of appetite she lost 7 lbs ( 5 % of her weight)  in weeks and is still not back to baseline but states appetite is finally getting better. Discussed increase calorie intake. Discussed returning sooner for follow up, but she wants to wait 6 months for follow up  Patient Active Problem List   Diagnosis Date Noted   Pain due to onychomycosis of toenails of both feet 08/30/2020   Injury of toenail of right foot 08/30/2020   Edema of lower extremity 12/05/2019   Depression, major, recurrent, in remission ([place]) 08/10/2017   Varicose veins of both lower extremities with inflammation 12/01/2016   Chronic venous insufficiency 12/01/2016   Pain in limb 12/01/2016   Benign hypertension 11/06/2014   Chronic anemia 11/06/2014   Seborrheic dermatitis 11/06/2014   Dyslipidemia 11/06/2014   Cephalalgia 11/06/2014   Deafness, sensorineural 11/06/2014   Arthritis of knee, degenerative 11/06/2014   Panic attack 11/06/2014   Restless legs syndrome 11/06/2014   Allergic rhinitis 11/06/2014   Spinal stenosis 11/06/2014   Osteopenia 05/09/2009   Chronic kidney disease (CKD), stage III (moderate) ([place]) 04/20/2008    Past Surgical History:  Procedure Laterality Date   ABDOMINAL HYSTERECTOMY     CATARACT EXTRACTION W/PHACO Left 07/16/2017   Procedure: CATARACT EXTRACTION PHACO AND INTRAOCULAR LENS PLACEMENT ([place]);  Surgeon: Eulogio Bear, MD;  Location: [facility] ORS;  Service: Ophthalmology;  Laterality: Left;  [place] 01:27.0 AP% 21.0 CDE 18.28 Fluid Pack Lot # [id number]   CHOLECYSTECTOMY     HERNIA REPAIR      Family History  Problem Relation Age of Onset   Cancer Mother    Diabetes Father    Cancer Sister    Breast cancer Neg Hx     Social History   Tobacco Use   Smoking status: Never   Smokeless tobacco: Never   Tobacco comments:    smoking cessation materials not required  Substance Use  Topics   Alcohol use: No    Alcohol/week: 0.0 standard drinks     Current Outpatient Medications:    acetaminophen (TYLENOL) 500 MG tablet, Take 1,000 mg by mouth daily as needed for moderate pain or headache., Disp: , Rfl:    amLODipine (NORVASC) 2.5 MG tablet, TAKE 1 TABLET BY MOUTH EVERY DAY, Disp: 90 tablet, Rfl: 1   aspirin EC 81 MG tablet, Take 81 mg by mouth daily., Disp: , Rfl:    Cholecalciferol (VITAMIN D) 2000 UNITS tablet, Take 2,000 Units by mouth every evening. , Disp: , Rfl:    diclofenac Sodium (VOLTAREN) 1 % GEL, Apply 4 g topically 4 (four) times daily., Disp: 300 g, Rfl: 1   Glucosamine-Chondroitin (OSTEO BI-FLEX REGULAR STRENGTH PO), Take 1 tablet by mouth daily., Disp: , Rfl:    Multiple Vitamin (MULTI-VITAMINS) TABS, Take 1 tablet by mouth every evening. , Disp: , Rfl:    Polyethyl Glycol-Propyl Glycol (SYSTANE OP), Apply 1 drop to eye daily as needed (dry eyes)., Disp: , Rfl:    rosuvastatin (CRESTOR) 20 MG tablet, Take 1 tablet (20 mg total) by mouth daily. In place of pravastatin for cholesterol, Disp: 90 tablet, Rfl: 1   telmisartan (MICARDIS) 40 MG tablet, Take 1 tablet (40 mg total) by mouth daily., Disp: 90 tablet, Rfl: 1  Allergies  Allergen Reactions   Sulfa Antibiotics Rash    I personally reviewed active problem list, medication list, allergies, family history, social history, health maintenance with the patient/caregiver today.   ROS  Constitutional: Negative for fever , positive for weight change.  Respiratory: Negative for cough and shortness of breath.   Cardiovascular: Negative for chest pain or palpitations.  Gastrointestinal: Negative for abdominal pain, no bowel changes.  Musculoskeletal: Negative for gait problem or joint swelling.  Skin: Negative for rash.  Neurological: Negative for dizziness or headache.  No other specific complaints in a complete review of systems (except as listed in HPI above).   Objective  Vitals:   08/28/21  1057  BP: 120/78  Pulse: 92  Resp: 16  SpO2: 98%  Weight: 136 lb (61.7 kg)  Height: 5\' 6"  (1.676 m)    Body mass index is 21.95 kg/m.  Physical Exam  Constitutional: Patient appears well-developed and well-nourished.  No distress.  HEENT: head atraumatic, normocephalic, pupils equal and reactive to light, neck supple Cardiovascular: Normal rate, regular rhythm and normal heart sounds.  No murmur heard. No BLE edema. Pulmonary/Chest: Effort normal and breath sounds normal. No respiratory distress. Abdominal: Soft.  There is no tenderness. Psychiatric: Patient has a normal mood and affect. behavior is normal. Judgment and thought content normal.    PHQ2/9: Depression screen [facility] 2/9 08/28/2021 07/30/2021 01/23/2021 12/06/2020 09/17/2020  Decreased Interest 0 0 0 0 0  Down, Depressed, Hopeless 0 0 0 0 0  PHQ - 2 Score 0 0 0 0  0  Altered sleeping 0 - - - -  Tired, decreased energy 0 - - - -  Change in appetite 0 - - - -  Feeling bad or failure about yourself  0 - - - -  Trouble concentrating 0 - - - -  Moving slowly or fidgety/restless 0 - - - -  Suicidal thoughts 0 - - - -  PHQ-9 Score 0 - - - -  Difficult doing work/chores - - - - -  Some recent data might be hidden    phq 9 is negative   Fall Risk: Fall Risk  08/28/2021 07/30/2021 01/23/2021 12/06/2020 09/17/2020  Falls in the past year? 0 0 0 0 1  Number falls in past yr: 0 0 0 0 0  Comment - - - - -  Injury with Fall? 0 0 0 0 1  Comment - - - - -  Risk for fall due to : Impaired balance/gait - - Impaired balance/gait History of fall(s);Impaired balance/gait  Risk for fall due to: Comment - - - - -  Follow up Falls prevention discussed Falls evaluation completed - Falls prevention discussed Falls prevention discussed     Functional Status Survey: Is the patient deaf or have difficulty hearing?: No Does the patient have difficulty seeing, even when wearing glasses/contacts?: No Does the patient have difficulty  concentrating, remembering, or making decisions?: No Does the patient have difficulty walking or climbing stairs?: Yes Does the patient have difficulty dressing or bathing?: No Does the patient have difficulty doing errands alone such as visiting a doctor's office or shopping?: No    Assessment & Plan  1. Type 2 diabetes mellitus with stage 3a chronic kidney disease, without long-term current use of insulin (HCC)  - HgB A1c - COMPLETE METABOLIC PANEL WITH GFR  2. Benign hypertension with chronic kidney disease, stage III ([place])   3. Chronic kidney insufficiency, stage 3 (moderate) (HCC)  - COMPLETE METABOLIC PANEL WITH GFR - CBC with Differential/Platelet - VITAMIN D 25 Hydroxy (Vit-D Deficiency, Fractures)  4. Depression, major, recurrent, in remission ([place])   5. Dyslipidemia  - Lipid panel  6. Primary osteoarthritis of both knees   7. Varicose veins of leg with pain, right   8. Needs flu shot  - Flu Vaccine QUAD High Dose(Fluad)  9. Need for Tdap vaccination  - Tdap (ADACEL) 11-12-13.5 LF-MCG/0.5 injection; Inject 0.5 mLs into the muscle once for 1 dose.  Dispense: 0.5 mL; Refill: 0

## 2021-08-28 ENCOUNTER — Ambulatory Visit (INDEPENDENT_AMBULATORY_CARE_PROVIDER_SITE_OTHER): Payer: Medicare Other | Admitting: Family Medicine

## 2021-08-28 ENCOUNTER — Other Ambulatory Visit: Payer: Self-pay

## 2021-08-28 ENCOUNTER — Encounter: Payer: Self-pay | Admitting: Family Medicine

## 2021-08-28 VITALS — BP 120/78 | HR 92 | Resp 16 | Ht 66.0 in | Wt 136.0 lb

## 2021-08-28 DIAGNOSIS — F3340 Major depressive disorder, recurrent, in remission, unspecified: Secondary | ICD-10-CM

## 2021-08-28 DIAGNOSIS — N183 Chronic kidney disease, stage 3 unspecified: Secondary | ICD-10-CM

## 2021-08-28 DIAGNOSIS — I129 Hypertensive chronic kidney disease with stage 1 through stage 4 chronic kidney disease, or unspecified chronic kidney disease: Secondary | ICD-10-CM

## 2021-08-28 DIAGNOSIS — E785 Hyperlipidemia, unspecified: Secondary | ICD-10-CM | POA: Diagnosis not present

## 2021-08-28 DIAGNOSIS — Z23 Encounter for immunization: Secondary | ICD-10-CM | POA: Diagnosis not present

## 2021-08-28 DIAGNOSIS — I83811 Varicose veins of right lower extremities with pain: Secondary | ICD-10-CM

## 2021-08-28 DIAGNOSIS — N1831 Chronic kidney disease, stage 3a: Secondary | ICD-10-CM | POA: Diagnosis not present

## 2021-08-28 DIAGNOSIS — E1122 Type 2 diabetes mellitus with diabetic chronic kidney disease: Secondary | ICD-10-CM

## 2021-08-28 DIAGNOSIS — M170 Bilateral primary osteoarthritis of knee: Secondary | ICD-10-CM

## 2021-08-28 DIAGNOSIS — N2889 Other specified disorders of kidney and ureter: Secondary | ICD-10-CM

## 2021-08-28 MED ORDER — TETANUS-DIPHTH-ACELL PERTUSSIS 5-2-15.5 LF-MCG/0.5 IM SUSP: 0.5000 mL | Freq: Once | INTRAMUSCULAR | 0 refills | Status: AC

## 2021-08-29 LAB — COMPLETE METABOLIC PANEL WITH GFR
AG Ratio: 1.5 (calc) (ref 1.0–2.5)
ALT: 15 U/L (ref 6–29)
AST: 25 U/L (ref 10–35)
Albumin: 4.4 g/dL (ref 3.6–5.1)
Alkaline phosphatase (APISO): 78 U/L (ref 37–153)
BUN/Creatinine Ratio: 17 (calc) (ref 6–22)
BUN: 18 mg/dL (ref 7–25)
CO2: 27 mmol/L (ref 20–32)
Calcium: 9.6 mg/dL (ref 8.6–10.4)
Chloride: 108 mmol/L (ref 98–110)
Creat: 1.07 mg/dL — ABNORMAL HIGH (ref 0.60–0.95)
Globulin: 2.9 g/dL (calc) (ref 1.9–3.7)
Glucose, Bld: 96 mg/dL (ref 65–99)
Potassium: 4.4 mmol/L (ref 3.5–5.3)
Sodium: 144 mmol/L (ref 135–146)
Total Bilirubin: 0.6 mg/dL (ref 0.2–1.2)
Total Protein: 7.3 g/dL (ref 6.1–8.1)
eGFR: 52 mL/min/{1.73_m2} — ABNORMAL LOW (ref >=60)

## 2021-08-29 LAB — CBC WITH DIFFERENTIAL/PLATELET
Absolute Monocytes: 513 cells/uL (ref 200–950)
Basophils Absolute: 32 cells/uL (ref 0–200)
Basophils Relative: 0.7 %
Eosinophils Absolute: 50 cells/uL (ref 15–500)
Eosinophils Relative: 1.1 %
HCT: 34 % — ABNORMAL LOW (ref 35.0–45.0)
Hemoglobin: 10.9 g/dL — ABNORMAL LOW (ref 11.7–15.5)
Lymphs Abs: 1436 cells/uL (ref 850–3900)
MCH: 28.4 pg (ref 27.0–33.0)
MCHC: 32.1 g/dL (ref 32.0–36.0)
MCV: 88.5 fL (ref 80.0–100.0)
MPV: 11.2 fL (ref 7.5–12.5)
Monocytes Relative: 11.4 %
Neutro Abs: 2471 cells/uL (ref 1500–7800)
Neutrophils Relative %: 54.9 %
Platelets: 216 10*3/uL (ref 140–400)
RBC: 3.84 10*6/uL (ref 3.80–5.10)
RDW: 14.9 % (ref 11.0–15.0)
Total Lymphocyte: 31.9 %
WBC: 4.5 10*3/uL (ref 3.8–10.8)

## 2021-08-29 LAB — LIPID PANEL
Cholesterol: 180 mg/dL (ref <200)
HDL: 89 mg/dL (ref >=50)
LDL Cholesterol (Calc): 78 mg/dL (calc)
Non-HDL Cholesterol (Calc): 91 mg/dL (calc) (ref <130)
Total CHOL/HDL Ratio: 2 (calc) (ref <5.0)
Triglycerides: 45 mg/dL (ref <150)

## 2021-08-29 LAB — VITAMIN D 25 HYDROXY (VIT D DEFICIENCY, FRACTURES): Vit D, 25-Hydroxy: 65 ng/mL (ref 30–100)

## 2021-08-29 LAB — HEMOGLOBIN A1C
Hgb A1c MFr Bld: 5.7 % of total Hgb — ABNORMAL HIGH (ref <5.7)
Mean Plasma Glucose: 117 mg/dL
eAG (mmol/L): 6.5 mmol/L

## 2021-09-27 ENCOUNTER — Other Ambulatory Visit: Payer: Self-pay | Admitting: Family Medicine

## 2021-09-27 DIAGNOSIS — N183 Chronic kidney disease, stage 3 unspecified: Secondary | ICD-10-CM

## 2021-09-27 DIAGNOSIS — I10 Essential (primary) hypertension: Secondary | ICD-10-CM

## 2021-09-29 ENCOUNTER — Other Ambulatory Visit: Payer: Self-pay | Admitting: Family Medicine

## 2021-09-29 DIAGNOSIS — I10 Essential (primary) hypertension: Secondary | ICD-10-CM

## 2021-10-26 ENCOUNTER — Other Ambulatory Visit: Payer: Self-pay | Admitting: Family Medicine

## 2021-10-26 DIAGNOSIS — E785 Hyperlipidemia, unspecified: Secondary | ICD-10-CM

## 2021-11-04 DIAGNOSIS — Z23 Encounter for immunization: Secondary | ICD-10-CM | POA: Diagnosis not present

## 2021-11-18 ENCOUNTER — Ambulatory Visit (INDEPENDENT_AMBULATORY_CARE_PROVIDER_SITE_OTHER): Payer: Medicare Other | Admitting: Podiatry

## 2021-11-18 ENCOUNTER — Encounter: Payer: Self-pay | Admitting: Podiatry

## 2021-11-18 DIAGNOSIS — E1122 Type 2 diabetes mellitus with diabetic chronic kidney disease: Secondary | ICD-10-CM

## 2021-11-18 DIAGNOSIS — I872 Venous insufficiency (chronic) (peripheral): Secondary | ICD-10-CM

## 2021-11-18 DIAGNOSIS — N183 Chronic kidney disease, stage 3 unspecified: Secondary | ICD-10-CM

## 2021-11-18 DIAGNOSIS — M79675 Pain in left toe(s): Secondary | ICD-10-CM | POA: Diagnosis not present

## 2021-11-18 DIAGNOSIS — M79674 Pain in right toe(s): Secondary | ICD-10-CM | POA: Diagnosis not present

## 2021-11-18 DIAGNOSIS — B351 Tinea unguium: Secondary | ICD-10-CM

## 2021-11-18 NOTE — Progress Notes (Signed)
This patient returns to my office for at risk foot care.  This patient requires this care by a professional since this patient will be at risk due to having chronic kidney disease and chronic venous insufficiency.  This patient is unable to cut nails herself since the patient cannot reach her nails.These nails are painful walking and wearing shoes.  This patient presents for at risk foot care today. ? ?General Appearance  Alert, conversant and in no acute stress. ? ?Vascular  Dorsalis pedis and posterior tibial  pulses are weakly  palpable  bilaterally.  Capillary return is within normal limits  bilaterally. Temperature is within normal limits  bilaterally. ? ?Neurologic  Senn-Weinstein monofilament wire test within normal limits  bilaterally. Muscle power within normal limits bilaterally. ? ?Nails Thick disfigured discolored nails with subungual debris  from hallux to fifth toes bilaterally. No evidence of bacterial infection or drainage bilaterally. ? ?Orthopedic  No limitations of motion  feet .  No crepitus or effusions noted.  No bony pathology or digital deformities noted. ? ?Skin  normotropic skin with no porokeratosis noted bilaterally.  No signs of infections or ulcers noted.   Clavi 3rd toe left foot. ? ?Onychomycosis  Pain in right toes  Pain in left toes   ? ?Consent was obtained for treatment procedures.   Mechanical debridement of nails 1-5  bilaterally performed with a nail nipper.  Filed with dremel without incident.   ? ? ?Return office visit  3 months                   Told patient to return for periodic foot care and evaluation due to potential at risk complications. ? ? ?Helane Gunther DPM  ?

## 2021-12-10 ENCOUNTER — Ambulatory Visit: Payer: Medicare Other

## 2021-12-24 ENCOUNTER — Ambulatory Visit (INDEPENDENT_AMBULATORY_CARE_PROVIDER_SITE_OTHER): Payer: Medicare Other

## 2021-12-24 DIAGNOSIS — Z780 Asymptomatic menopausal state: Secondary | ICD-10-CM

## 2021-12-24 DIAGNOSIS — Z0000 Encounter for general adult medical examination without abnormal findings: Secondary | ICD-10-CM

## 2021-12-24 DIAGNOSIS — Z1231 Encounter for screening mammogram for malignant neoplasm of breast: Secondary | ICD-10-CM

## 2021-12-24 NOTE — Progress Notes (Signed)
Subjective:   Melanie Gray is a 81 y.o. female who presents for Medicare Annual (Subsequent) preventive examination.  Virtual Visit via Telephone Note  I connected with  Darcel Smalling on 12/24/21 at  1:00 PM EDT by telephone and verified that I am speaking with the correct person using two identifiers.  Location: Patient: home Provider: [place] Persons participating in the virtual visit: [place]   I discussed the limitations, risks, security and privacy concerns of performing an evaluation and management service by telephone and the availability of in person appointments. The patient expressed understanding and agreed to proceed.  Interactive audio and video telecommunications were attempted between this nurse and patient, however failed, due to patient having technical difficulties OR patient did not have access to video capability.  We continued and completed visit with audio only.  Some vital signs may be absent or patient reported.   Clemetine Marker, LPN   Review of Systems     Cardiac Risk Factors include: advanced age (>38men, >74 women);diabetes mellitus;dyslipidemia;hypertension     Objective:    There were no vitals filed for this visit. There is no height or weight on file to calculate BMI.     12/24/2021    1:19 PM 12/06/2020   10:47 AM 11/15/2019    9:28 AM 09/17/2018    9:07 AM 09/11/2017   10:36 AM 04/08/2017   10:11 AM 03/25/2017   11:46 AM  Advanced Directives  Does Patient Have a Medical Advance Directive? No No No No No No No  Would patient like information on creating a medical advance directive? No - Patient declined Yes (MAU/Ambulatory/Procedural Areas - Information given) No - Patient declined No - Patient declined Yes (MAU/Ambulatory/Procedural Areas - Information given)      Current Medications (verified) Outpatient Encounter Medications as of 12/24/2021  Medication Sig   acetaminophen (TYLENOL) 500 MG tablet Take 1,000 mg by mouth daily as  needed for moderate pain or headache.   amLODipine (NORVASC) 2.5 MG tablet TAKE 1 TABLET BY MOUTH EVERY DAY   aspirin EC 81 MG tablet Take 81 mg by mouth daily.   Cholecalciferol (VITAMIN D) 2000 UNITS tablet Take 2,000 Units by mouth every evening.    diclofenac Sodium (VOLTAREN) 1 % GEL Apply 4 g topically 4 (four) times daily.   Glucosamine-Chondroitin (OSTEO BI-FLEX REGULAR STRENGTH PO) Take 1 tablet by mouth daily.   Multiple Vitamin (MULTI-VITAMINS) TABS Take 1 tablet by mouth every evening.    Polyethyl Glycol-Propyl Glycol (SYSTANE OP) Apply 1 drop to eye daily as needed (dry eyes).   telmisartan (MICARDIS) 40 MG tablet TAKE 1 TABLET BY MOUTH EVERY DAY   rosuvastatin (CRESTOR) 20 MG tablet TAKE 1 TABLET (20 MG TOTAL) BY MOUTH DAILY. IN PLACE OF PRAVASTATIN FOR CHOLESTEROL (Patient not taking: Reported on 12/24/2021)   No facility-administered encounter medications on file as of 12/24/2021.    Allergies (verified) Sulfa antibiotics   History: Past Medical History:  Diagnosis Date   Chronic kidney disease    RENAL INSUFF   Complication of anesthesia    HA , N/V   Diabetes mellitus without complication (HCC)    Hyperlipidemia    Hypertension    Knee instability    RIGHT KNEE WEAKNESS   PONV (postoperative nausea and vomiting)    Vertigo    Past Surgical History:  Procedure Laterality Date   ABDOMINAL HYSTERECTOMY     CATARACT EXTRACTION W/PHACO Left 07/16/2017   Procedure: CATARACT EXTRACTION PHACO AND INTRAOCULAR LENS  PLACEMENT (IOC);  Surgeon: Eulogio Bear, MD;  Location: [facility] ORS;  Service: Ophthalmology;  Laterality: Left;  [place] 01:27.0 AP% 21.0 CDE 18.28 Fluid Pack Lot # [id number]   CHOLECYSTECTOMY     HERNIA REPAIR     Family History  Problem Relation Age of Onset   Cancer Mother    Diabetes Father    Cancer Sister    Breast cancer Neg Hx    Social History   Socioeconomic History   Marital status: Widowed    Spouse name: Jeneen Rinks   Number of children: 2    Years of education: Not on file   Highest education level: 12th grade  Occupational History    Employer: RETIRED  Tobacco Use   Smoking status: Never   Smokeless tobacco: Never   Tobacco comments:    smoking cessation materials not required  Vaping Use   Vaping Use: Never used  Substance and Sexual Activity   Alcohol use: No    Alcohol/week: 0.0 standard drinks of alcohol   Drug use: No   Sexual activity: Not Currently  Other Topics Concern   Not on file  Social History Narrative   Son moved in with her early 2022    Social Determinants of Health   Financial Resource Strain: Low Risk  (12/24/2021)   Overall Financial Resource Strain (CARDIA)    Difficulty of Paying Living Expenses: Not hard at all  Food Insecurity: No Food Insecurity (12/24/2021)   Hunger Vital Sign    Worried About Running Out of Food in the Last Year: Never true    Ran Out of Food in the Last Year: Never true  Transportation Needs: No Transportation Needs (12/24/2021)   PRAPARE - Hydrologist (Medical): No    Lack of Transportation (Non-Medical): No  Physical Activity: Inactive (12/24/2021)   Exercise Vital Sign    Days of Exercise per Week: 0 days    Minutes of Exercise per Session: 0 min  Stress: No Stress Concern Present (12/24/2021)   [place]    Feeling of Stress : Not at all  Social Connections: Socially Isolated (12/24/2021)   Social Connection and Isolation Panel [NHANES]    Frequency of Communication with Friends and Family: More than three times a week    Frequency of Social Gatherings with Friends and Family: Twice a week    Attends Religious Services: Never    Marine scientist or Organizations: No    Attends Archivist Meetings: Never    Marital Status: Widowed    Tobacco Counseling Counseling given: Not Answered Tobacco comments: smoking cessation materials not  required   Clinical Intake:  Pre-visit preparation completed: Yes  Pain : No/denies pain     Nutritional Risks: None Diabetes: Yes CBG done?: No Did pt. bring in CBG monitor from home?: No  How often do you need to have someone help you when you read instructions, pamphlets, or other written materials from your doctor or pharmacy?: 1 - Never  Nutrition Risk Assessment:  Has the patient had any N/V/D within the last 2 months?  No  Does the patient have any non-healing wounds?  No  Has the patient had any unintentional weight loss or weight gain?  No   Diabetes:  Is the patient diabetic?  Yes  If diabetic, was a CBG obtained today?  No  Did the patient bring in their glucometer from home?  No  How often do you monitor your CBG's? Once weekly.   Financial Strains and Diabetes Management:  Are you having any financial strains with the device, your supplies or your medication? No .  Does the patient want to be seen by Chronic Care Management for management of their diabetes?  No  Would the patient like to be referred to a Nutritionist or for Diabetic Management?  No   Diabetic Exams:  Diabetic Eye Exam: Completed 2019. Overdue for diabetic eye exam. Pt has been advised about the importance in completing this exam.   Diabetic Foot Exam: Completed 03/25/21.   Interpreter Needed?: No  Information entered by :: Clemetine Marker LPN   Activities of Daily Living    12/24/2021    1:21 PM 08/28/2021   10:56 AM  In your present state of health, do you have any difficulty performing the following activities:  Hearing? 0 0  Vision? 0 0  Difficulty concentrating or making decisions? 0 0  Walking or climbing stairs? 1 1  Dressing or bathing? 0 0  Doing errands, shopping? 0 0  Preparing Food and eating ? N   Using the Toilet? N   In the past six months, have you accidently leaked urine? N   Do you have problems with loss of bowel control? N   Managing your Medications? N    Managing your Finances? N   Housekeeping or managing your Housekeeping? N     Patient Care Team: Steele Sizer, MD as PCP - General (Family Medicine) Eulogio Bear, MD as Consulting Physician (Ophthalmology) Murlean Iba, MD (Nephrology) Gardiner Barefoot, DPM as Consulting Physician (Podiatry)  Indicate any recent Medical Services you may have received from other than [facility] providers in the past year (date may be approximate).     Assessment:   This is a routine wellness examination for [facility].  Hearing/Vision screen Hearing Screening - Comments:: Pt denies difficulty hearing Vision Screening - Comments:: Annual vision screenings with Dr. Edison Pace; due for exam   Dietary issues and exercise activities discussed: Current Exercise Habits: The patient does not participate in regular exercise at present, Exercise limited by: orthopedic condition(s)   Goals Addressed             This Visit's Progress    DIET - INCREASE WATER INTAKE   On track    Recommend to drink at least 6-8 8oz glasses of water per day.       Depression Screen    12/24/2021    1:17 PM 08/28/2021   10:56 AM 07/30/2021    2:58 PM 01/23/2021    9:50 AM 12/06/2020   10:46 AM 09/17/2020   10:16 AM 05/16/2020   10:37 AM  PHQ 2/9 Scores  PHQ - 2 Score 0 0 0 0 0 0 1  PHQ- 9 Score  0     2    Fall Risk    12/24/2021    1:21 PM 08/28/2021   10:56 AM 07/30/2021    2:58 PM 01/23/2021    9:50 AM 12/06/2020   10:49 AM  Fall Risk   Falls in the past year? 0 0 0 0 0  Number falls in past yr: 0 0 0 0 0  Injury with Fall? 0 0 0 0 0  Risk for fall due to : No Fall Risks Impaired balance/gait   Impaired balance/gait  Follow up Falls prevention discussed Falls prevention discussed Falls evaluation completed  Falls prevention discussed    FALL RISK PREVENTION PERTAINING  TO THE HOME:  Any stairs in or around the home? Yes  If so, are there any without handrails? No  Home free of loose throw rugs in walkways, pet  beds, electrical cords, etc? Yes  Adequate lighting in your home to reduce risk of falls? Yes   ASSISTIVE DEVICES UTILIZED TO PREVENT FALLS:  Life alert? No  Use of a cane, walker or w/c? Yes  Grab bars in the bathroom? No  Shower chair or bench in shower? No  Elevated toilet seat or a handicapped toilet? Yes   TIMED UP AND GO:  Was the test performed? No . Telephonic visit.   Cognitive Function: Normal cognitive status assessed by direct observation by this Nurse Health Advisor. No abnormalities found.          11/15/2019    9:36 AM 09/17/2018    9:15 AM 09/11/2017   10:52 AM  6CIT Screen  What Year? 0 points 0 points 0 points  What month? 0 points 0 points 0 points  What time? 0 points 0 points 0 points  Count back from 20 0 points 0 points 0 points  Months in reverse 0 points 0 points 0 points  Repeat phrase 2 points 2 points 6 points  Total Score 2 points 2 points 6 points    Immunizations Immunization History  Administered Date(s) Administered   Fluad Quad(high Dose 65+) 03/28/2019, 05/16/2020, 08/28/2021   Influenza Split 03/25/2010   Influenza, High Dose Seasonal PF 03/26/2015, 03/28/2016, 04/08/2017, 06/07/2018   Influenza, Seasonal, Injecte, Preservative Fre 04/02/2011, 06/16/2012   Influenza,inj,Quad PF,6+ Mos 02/24/2014   Influenza-Unspecified 02/24/2014   PFIZER(Purple Top)SARS-COV-2 Vaccination 08/04/2019, 08/25/2019, 05/29/2020   Pneumococcal Conjugate-13 03/26/2015   Pneumococcal Polysaccharide-23 07/25/2010   Tdap 07/21/2006   Zoster Recombinat (Shingrix) 04/24/2020, 06/25/2020   Zoster, Live 06/24/2013    TDAP status: Due, Education has been provided regarding the importance of this vaccine. Advised may receive this vaccine at local pharmacy or Health Dept. Aware to provide a copy of the vaccination record if obtained from local pharmacy or Health Dept. Verbalized acceptance and understanding.  Flu Vaccine status: Up to date  Pneumococcal vaccine  status: Up to date  Covid-19 vaccine status: Completed vaccines  Qualifies for Shingles Vaccine? Yes   Zostavax completed Yes   Shingrix Completed?: Yes  Screening Tests Health Maintenance  Topic Date Due   TETANUS/TDAP  07/21/2016   OPHTHALMOLOGY EXAM  07/23/2018   COVID-19 Vaccine (4 - Pfizer series) 07/24/2020   MAMMOGRAM  11/22/2020   INFLUENZA VACCINE  02/11/2022   HEMOGLOBIN A1C  02/25/2022   FOOT EXAM  03/25/2022   Pneumonia Vaccine 15+ Years old  Completed   DEXA SCAN  Completed   Zoster Vaccines- Shingrix  Completed   HPV VACCINES  Aged Out    Health Maintenance  Health Maintenance Due  Topic Date Due   TETANUS/TDAP  07/21/2016   OPHTHALMOLOGY EXAM  07/23/2018   COVID-19 Vaccine (4 - Pfizer series) 07/24/2020   MAMMOGRAM  11/22/2020    Colorectal cancer screening: No longer required.   Mammogram status: Completed 11/23/19. Repeat every year. Ordered today  Bone Density status: Completed 11/23/19. Results reflect: Bone density results: OSTEOPENIA. Repeat every 2 years. Ordered today  Lung Cancer Screening: (Low Dose CT Chest recommended if Age 83-80 years, 30 pack-year currently smoking OR have quit w/in 15years.) does not qualify.   Additional Screening:  Hepatitis C Screening: does not qualify  Vision Screening: Recommended annual ophthalmology exams for early detection of  glaucoma and other disorders of the eye. Is the patient up to date with their annual eye exam?  No  Who is the provider or what is the name of the office in which the patient attends annual eye exams? [facility]   Dental Screening: Recommended annual dental exams for proper oral hygiene  Community Resource Referral / Chronic Care Management: CRR required this visit?  No   CCM required this visit?  No      Plan:     I have personally reviewed and noted the following in the patient's chart:   Medical and social history Use of alcohol, tobacco or illicit drugs   Current medications and supplements including opioid prescriptions.  Functional ability and status Nutritional status Physical activity Advanced directives List of other physicians Hospitalizations, surgeries, and ER visits in previous 12 months Vitals Screenings to include cognitive, depression, and falls Referrals and appointments  In addition, I have reviewed and discussed with patient certain preventive protocols, quality metrics, and best practice recommendations. A written personalized care plan for preventive services as well as general preventive health recommendations were provided to patient.     Clemetine Marker, LPN   [id number]   Nurse Notes: pt states she is not currently taking rosuvastatin 20 mg due to cost. Per patient [facility] stated it would $200 for a 90 day supply. Patient does not have any prescription part D insurance. She is requesting and alternative prescription that would be more affordable. Discussed possibly using [facility] or [facility] for generic drug plan but would need new rx. Please contact to advise. Thank you.

## 2021-12-24 NOTE — Patient Instructions (Signed)
Melanie Gray , Thank you for taking time to come for your Medicare Wellness Visit. I appreciate your ongoing commitment to your health goals. Please review the following plan we discussed and let me know if I can assist you in the future.   Screening recommendations/referrals: Colonoscopy: no longer required Mammogram: done 11/23/19. Please call [phone] to schedule your mammogram and bone density screening Bone Density: done 11/23/19 Recommended yearly ophthalmology/optometry visit for glaucoma screening and checkup Recommended yearly dental visit for hygiene and checkup  Vaccinations: Influenza vaccine: done 08/28/21 Pneumococcal vaccine: done 03/26/15 Tdap vaccine: due Shingles vaccine: done 04/24/20 & 06/25/20   Covid-19:done 08/04/19, 08/25/19 & 05/29/20  Advanced directives: Please bring a copy of your health care power of attorney and living will to the office at your convenience.   Conditions/risks identified: Recommend increasing activity as tolerated  Next appointment: Follow up in one year for your annual wellness visit    Preventive Care 65 Years and Older, Female Preventive care refers to lifestyle choices and visits with your health care provider that can promote health and wellness. What does preventive care include? A yearly physical exam. This is also called an annual well check. Dental exams once or twice a year. Routine eye exams. Ask your health care provider how often you should have your eyes checked. Personal lifestyle choices, including: Daily care of your teeth and gums. Regular physical activity. Eating a healthy diet. Avoiding tobacco and drug use. Limiting alcohol use. Practicing safe sex. Taking low-dose aspirin every day. Taking vitamin and mineral supplements as recommended by your health care provider. What happens during an annual well check? The services and screenings done by your health care provider during your annual well check will depend on  your age, overall health, lifestyle risk factors, and family history of disease. Counseling  Your health care provider may ask you questions about your: Alcohol use. Tobacco use. Drug use. Emotional well-being. Home and relationship well-being. Sexual activity. Eating habits. History of falls. Memory and ability to understand (cognition). Work and work Astronomer. Reproductive health. Screening  You may have the following tests or measurements: Height, weight, and BMI. Blood pressure. Lipid and cholesterol levels. These may be checked every 5 years, or more frequently if you are over 38 years old. Skin check. Lung cancer screening. You may have this screening every year starting at age 89 if you have a 30-pack-year history of smoking and currently smoke or have quit within the past 15 years. Fecal occult blood test (FOBT) of the stool. You may have this test every year starting at age 70. Flexible sigmoidoscopy or colonoscopy. You may have a sigmoidoscopy every 5 years or a colonoscopy every 10 years starting at age 13. Hepatitis C blood test. Hepatitis B blood test. Sexually transmitted disease (STD) testing. Diabetes screening. This is done by checking your blood sugar (glucose) after you have not eaten for a while (fasting). You may have this done every 1-3 years. Bone density scan. This is done to screen for osteoporosis. You may have this done starting at age 7. Mammogram. This may be done every 1-2 years. Talk to your health care provider about how often you should have regular mammograms. Talk with your health care provider about your test results, treatment options, and if necessary, the need for more tests. Vaccines  Your health care provider may recommend certain vaccines, such as: Influenza vaccine. This is recommended every year. Tetanus, diphtheria, and acellular pertussis (Tdap, Td) vaccine. You may need a Td  booster every 10 years. Zoster vaccine. You may need this  after age 52. Pneumococcal 13-valent conjugate (PCV13) vaccine. One dose is recommended after age 45. Pneumococcal polysaccharide (PPSV23) vaccine. One dose is recommended after age 64. Talk to your health care provider about which screenings and vaccines you need and how often you need them. This information is not intended to replace advice given to you by your health care provider. Make sure you discuss any questions you have with your health care provider. Document Released: 07/27/2015 Document Revised: 03/19/2016 Document Reviewed: 05/01/2015 Elsevier Interactive Patient Education  2017 [place] Prevention in the Home Falls can cause injuries. They can happen to people of all ages. There are many things you can do to make your home safe and to help prevent falls. What can I do on the outside of my home? Regularly fix the edges of walkways and driveways and fix any cracks. Remove anything that might make you trip as you walk through a door, such as a raised step or threshold. Trim any bushes or trees on the path to your home. Use bright outdoor lighting. Clear any walking paths of anything that might make someone trip, such as rocks or tools. Regularly check to see if handrails are loose or broken. Make sure that both sides of any steps have handrails. Any raised decks and porches should have guardrails on the edges. Have any leaves, snow, or ice cleared regularly. Use sand or salt on walking paths during winter. Clean up any spills in your garage right away. This includes oil or grease spills. What can I do in the bathroom? Use night lights. Install grab bars by the toilet and in the tub and shower. Do not use towel bars as grab bars. Use non-skid mats or decals in the tub or shower. If you need to sit down in the shower, use a plastic, non-slip stool. Keep the floor dry. Clean up any water that spills on the floor as soon as it happens. Remove soap buildup in the tub or  shower regularly. Attach bath mats securely with double-sided non-slip rug tape. Do not have throw rugs and other things on the floor that can make you trip. What can I do in the bedroom? Use night lights. Make sure that you have a light by your bed that is easy to reach. Do not use any sheets or blankets that are too big for your bed. They should not hang down onto the floor. Have a firm chair that has side arms. You can use this for support while you get dressed. Do not have throw rugs and other things on the floor that can make you trip. What can I do in the kitchen? Clean up any spills right away. Avoid walking on wet floors. Keep items that you use a lot in easy-to-reach places. If you need to reach something above you, use a strong step stool that has a grab bar. Keep electrical cords out of the way. Do not use floor polish or wax that makes floors slippery. If you must use wax, use non-skid floor wax. Do not have throw rugs and other things on the floor that can make you trip. What can I do with my stairs? Do not leave any items on the stairs. Make sure that there are handrails on both sides of the stairs and use them. Fix handrails that are broken or loose. Make sure that handrails are as long as the stairways. Check any carpeting  to make sure that it is firmly attached to the stairs. Fix any carpet that is loose or worn. Avoid having throw rugs at the top or bottom of the stairs. If you do have throw rugs, attach them to the floor with carpet tape. Make sure that you have a light switch at the top of the stairs and the bottom of the stairs. If you do not have them, ask someone to add them for you. What else can I do to help prevent falls? Wear shoes that: Do not have high heels. Have rubber bottoms. Are comfortable and fit you well. Are closed at the toe. Do not wear sandals. If you use a stepladder: Make sure that it is fully opened. Do not climb a closed stepladder. Make  sure that both sides of the stepladder are locked into place. Ask someone to hold it for you, if possible. Clearly mark and make sure that you can see: Any grab bars or handrails. First and last steps. Where the edge of each step is. Use tools that help you move around (mobility aids) if they are needed. These include: Canes. Walkers. Scooters. Crutches. Turn on the lights when you go into a dark area. Replace any light bulbs as soon as they burn out. Set up your furniture so you have a clear path. Avoid moving your furniture around. If any of your floors are uneven, fix them. If there are any pets around you, be aware of where they are. Review your medicines with your doctor. Some medicines can make you feel dizzy. This can increase your chance of falling. Ask your doctor what other things that you can do to help prevent falls. This information is not intended to replace advice given to you by your health care provider. Make sure you discuss any questions you have with your health care provider. Document Released: 04/26/2009 Document Revised: 12/06/2015 Document Reviewed: 08/04/2014 Elsevier Interactive Patient Education  2017 [employer].

## 2022-02-03 ENCOUNTER — Telehealth: Payer: Self-pay | Admitting: Family Medicine

## 2022-02-03 NOTE — Telephone Encounter (Unsigned)
Copied from CRM [id number]. Topic: General - Other >> Feb 03, 2022  9:11 AM Macon Large wrote: Reason for CRM: Pt reports that she received a call from someone on Saturday requesting her Medicare# and her doctor's name. Pt stated she provided the caller with her Medicare# but she realized that may have been a mistake once the caller requested her doctor's last name. Pt stated she just wanted Dr. Carlynn Purl to be aware.

## 2022-02-24 ENCOUNTER — Encounter: Payer: Self-pay | Admitting: Podiatry

## 2022-02-24 ENCOUNTER — Ambulatory Visit (INDEPENDENT_AMBULATORY_CARE_PROVIDER_SITE_OTHER): Payer: Medicare Other | Admitting: Podiatry

## 2022-02-24 DIAGNOSIS — B351 Tinea unguium: Secondary | ICD-10-CM | POA: Diagnosis not present

## 2022-02-24 DIAGNOSIS — E1122 Type 2 diabetes mellitus with diabetic chronic kidney disease: Secondary | ICD-10-CM

## 2022-02-24 DIAGNOSIS — M79675 Pain in left toe(s): Secondary | ICD-10-CM

## 2022-02-24 DIAGNOSIS — M79674 Pain in right toe(s): Secondary | ICD-10-CM

## 2022-02-24 DIAGNOSIS — I872 Venous insufficiency (chronic) (peripheral): Secondary | ICD-10-CM

## 2022-02-24 DIAGNOSIS — N183 Chronic kidney disease, stage 3 unspecified: Secondary | ICD-10-CM

## 2022-02-24 NOTE — Progress Notes (Signed)
This patient returns to my office for at risk foot care.  This patient requires this care by a professional since this patient will be at risk due to having chronic kidney disease and chronic venous insufficiency.  This patient is unable to cut nails herself since the patient cannot reach her nails.These nails are painful walking and wearing shoes.  This patient presents for at risk foot care today.  General Appearance  Alert, conversant and in no acute stress.  Vascular  Dorsalis pedis and posterior tibial  pulses are weakly  palpable  bilaterally.  Capillary return is within normal limits  bilaterally. Temperature is within normal limits  bilaterally.  Neurologic  Senn-Weinstein monofilament wire test within normal limits  bilaterally. Muscle power within normal limits bilaterally.  Nails Thick disfigured discolored nails with subungual debris  from hallux to fifth toes bilaterally. No evidence of bacterial infection or drainage bilaterally.  Orthopedic  No limitations of motion  feet .  No crepitus or effusions noted.  No bony pathology or digital deformities noted.  Skin  normotropic skin with no porokeratosis noted bilaterally.  No signs of infections or ulcers noted.   Clavi 3rd toe left foot.  Onychomycosis  Pain in right toes  Pain in left toes    Consent was obtained for treatment procedures.   Mechanical debridement of nails 1-5  bilaterally performed with a nail nipper.  Filed with dremel without incident.  Debride clavi with dremel tool.   Return office visit  3 months                   Told patient to return for periodic foot care and evaluation due to potential at risk complications.   Wellington Winegarden DPM  

## 2022-02-25 ENCOUNTER — Ambulatory Visit: Payer: Medicare Other | Admitting: Family Medicine

## 2022-02-25 NOTE — Progress Notes (Unsigned)
Name: Melanie Gray   MRN: [id number]    DOB: August 12, 1940   Date:02/26/2022       Progress Note  Subjective  Chief Complaint  Follow Up  HPI  HTN: she is now on Micardis 40 mg and nephrologist gave her Amlodipine 2.5 mg and bp is still towards low end of normal  but   denies orthostatic hypotension . She has CKI stage  3 . Good urine output   Diabetes with renal manifestation: GFR stable, on ARB, she sees a nephrologist last urine micro was done 06/2021 and it was normal. She denies polyphagia, polydipsia or polyuria but has urinary frequency and nocturia. She follows a diabetic diet. HgbA1C has been within normal limits. Glucose at home has been at goal 98-123   Hyperlipidemia: last LDL was down from 96 to 78, she has been off pravastatin , we sent rosuvastatin but it was too costly and she never filled medication, discussed Good RX , we will send atorvastatin to [facility] so she can get the discounted price    Major Depression: she had depression on an off throughout her life, really struggled after her husband died and she lost weight, had lack of motivation for years, but she has been doing well now, she states she is an introvert, she is socializing over the phone. Her son has moved in with her early 2022 because she was afraid after her fall in 2021. She is doing well at this time    Varicose veins:  she wears compression stocking hoses now and seems to be helping with lower extremity edema. Unchanged    Urge incontinence: she drinks coffee and tea, advised to cut down on caffeine,  I gave her ditropan last visit but she never took it, she was afraid of side effects.  Unchanged    OA knee: history of arthroscopic surgery of right knee , she still uses a cane and walks slowly. Right knee has mild effusion intermittently , she walks very slowly , stable     Patient Active Problem List   Diagnosis Date Noted   Pain due to onychomycosis of toenails of both feet 08/30/2020   Injury of toenail of  right foot 08/30/2020   Edema of lower extremity 12/05/2019   Depression, major, recurrent, in remission (HCC) 08/10/2017   Varicose veins of both lower extremities with inflammation 12/01/2016   Chronic venous insufficiency 12/01/2016   Pain in limb 12/01/2016   Benign hypertension 11/06/2014   Chronic anemia 11/06/2014   Seborrheic dermatitis 11/06/2014   Dyslipidemia 11/06/2014   Cephalalgia 11/06/2014   Deafness, sensorineural 11/06/2014   Arthritis of knee, degenerative 11/06/2014   Panic attack 11/06/2014   Restless legs syndrome 11/06/2014   Allergic rhinitis 11/06/2014   Spinal stenosis 11/06/2014   Osteopenia 05/09/2009   Chronic kidney disease (CKD), stage III (moderate) (HCC) 04/20/2008    Past Surgical History:  Procedure Laterality Date   ABDOMINAL HYSTERECTOMY     CATARACT EXTRACTION W/PHACO Left 07/16/2017   Procedure: CATARACT EXTRACTION PHACO AND INTRAOCULAR LENS PLACEMENT (IOC);  Surgeon: [name], MD;  Location: [facility] ORS;  Service: Ophthalmology;  Laterality: Left;  [place] 01:27.0 AP% 21.0 CDE 18.28 Fluid Pack Lot # [id number]   CHOLECYSTECTOMY     HERNIA REPAIR      Family History  Problem Relation Age of Onset   Cancer Mother    Diabetes Father    Cancer Sister    Breast cancer Neg Hx     Social History  Tobacco Use   Smoking status: Never   Smokeless tobacco: Never   Tobacco comments:    smoking cessation materials not required  Substance Use Topics   Alcohol use: No    Alcohol/week: 0.0 standard drinks of alcohol     Current Outpatient Medications:    acetaminophen (TYLENOL) 500 MG tablet, Take 1,000 mg by mouth daily as needed for moderate pain or headache., Disp: , Rfl:    amLODipine (NORVASC) 2.5 MG tablet, TAKE 1 TABLET BY MOUTH EVERY DAY, Disp: 90 tablet, Rfl: 1   aspirin EC 81 MG tablet, Take 81 mg by mouth daily., Disp: , Rfl:    Cholecalciferol (VITAMIN D) 2000 UNITS tablet, Take 2,000 Units by mouth every evening. , Disp: ,  Rfl:    diclofenac Sodium (VOLTAREN) 1 % GEL, Apply 4 g topically 4 (four) times daily., Disp: 300 g, Rfl: 1   Glucosamine-Chondroitin (OSTEO BI-FLEX REGULAR STRENGTH PO), Take 1 tablet by mouth daily., Disp: , Rfl:    Multiple Vitamin (MULTI-VITAMINS) TABS, Take 1 tablet by mouth every evening. , Disp: , Rfl:    Polyethyl Glycol-Propyl Glycol (SYSTANE OP), Apply 1 drop to eye daily as needed (dry eyes)., Disp: , Rfl:    telmisartan (MICARDIS) 40 MG tablet, TAKE 1 TABLET BY MOUTH EVERY DAY, Disp: 90 tablet, Rfl: 1   rosuvastatin (CRESTOR) 20 MG tablet, TAKE 1 TABLET (20 MG TOTAL) BY MOUTH DAILY. IN PLACE OF PRAVASTATIN FOR CHOLESTEROL (Patient not taking: Reported on 02/26/2022), Disp: 90 tablet, Rfl: 1  Allergies  Allergen Reactions   Sulfa Antibiotics Rash    I personally reviewed active problem list, medication list, allergies, family history, social history, health maintenance with the patient/caregiver today.   ROS  Ten systems reviewed and is negative except as mentioned in HPI   Objective  Vitals:   02/26/22 1126  BP: 120/76  Pulse: 82  Resp: 16  SpO2: 99%  Weight: 138 lb (62.6 kg)  Height: 5\' 6"  (1.676 m)    Body mass index is 22.27 kg/m.  Physical Exam  Constitutional: Patient appears well-developed and well-nourished.  No distress.  HEENT: head atraumatic, normocephalic, pupils equal and reactive to light, neck supple Cardiovascular: Normal rate, regular rhythm and normal heart sounds.  No murmur heard. Trace BLE edema. Pulmonary/Chest: Effort normal and breath sounds normal. No respiratory distress. Abdominal: Soft.  There is no tenderness. Psychiatric: Patient has a normal mood and affect. behavior is normal. Judgment and thought content normal.   Recent Results (from the past 2160 hour(s))  POCT HgB A1C     Status: None   Collection Time: 02/26/22 11:28 AM  Result Value Ref Range   Hemoglobin A1C 5.5 4.0 - 5.6 %   HbA1c POC (<> result, manual entry)      HbA1c, POC (prediabetic range)     HbA1c, POC (controlled diabetic range)       PHQ2/9:    02/26/2022   11:27 AM 12/24/2021    1:17 PM 08/28/2021   10:56 AM 07/30/2021    2:58 PM 01/23/2021    9:50 AM  Depression screen PHQ 2/9  Decreased Interest 0 0 0 0 0  Down, Depressed, Hopeless 0 0 0 0 0  PHQ - 2 Score 0 0 0 0 0  Altered sleeping 1  0    Tired, decreased energy 0  0    Change in appetite 0  0    Feeling bad or failure about yourself  0  0  Trouble concentrating 0  0    Moving slowly or fidgety/restless 0  0    Suicidal thoughts 0  0    PHQ-9 Score 1  0      phq 9 is negative   Fall Risk:    02/26/2022   11:27 AM 12/24/2021    1:21 PM 08/28/2021   10:56 AM 07/30/2021    2:58 PM 01/23/2021    9:50 AM  Fall Risk   Falls in the past year? 0 0 0 0 0  Number falls in past yr: 0 0 0 0 0  Injury with Fall? 0 0 0 0 0  Risk for fall due to : Impaired balance/gait No Fall Risks Impaired balance/gait    Follow up Falls prevention discussed Falls prevention discussed Falls prevention discussed Falls evaluation completed       Functional Status Survey: Is the patient deaf or have difficulty hearing?: No Does the patient have difficulty seeing, even when wearing glasses/contacts?: No Does the patient have difficulty concentrating, remembering, or making decisions?: No Does the patient have difficulty walking or climbing stairs?: Yes Does the patient have difficulty dressing or bathing?: No Does the patient have difficulty doing errands alone such as visiting a doctor's office or shopping?: Yes    Assessment & Plan  1. Type 2 diabetes mellitus with stage 3a chronic kidney disease, without long-term current use of insulin (HCC)  - POCT HgB A1C  2. Chronic kidney insufficiency, stage 3 (moderate) (HCC)  - telmisartan (MICARDIS) 40 MG tablet; Take 1 tablet (40 mg total) by mouth daily.  Dispense: 90 tablet; Refill: 1  3. Benign hypertension  - amLODipine (NORVASC) 2.5  MG tablet; Take 1 tablet (2.5 mg total) by mouth daily.  Dispense: 90 tablet; Refill: 1 - telmisartan (MICARDIS) 40 MG tablet; Take 1 tablet (40 mg total) by mouth daily.  Dispense: 90 tablet; Refill: 1  4. Dyslipidemia  - atorvastatin (LIPITOR) 40 MG tablet; Take 1 tablet (40 mg total) by mouth daily.  Dispense: 90 tablet; Refill: 1    5. Depression, major, recurrent, in remission (HCC)   6. Benign hypertension with chronic kidney disease, stage III (HCC)   7. Primary osteoarthritis of both knees   8. Varicose veins of leg with pain, right

## 2022-02-26 ENCOUNTER — Ambulatory Visit (INDEPENDENT_AMBULATORY_CARE_PROVIDER_SITE_OTHER): Payer: Medicare Other | Admitting: Family Medicine

## 2022-02-26 ENCOUNTER — Encounter: Payer: Self-pay | Admitting: Family Medicine

## 2022-02-26 VITALS — BP 120/76 | HR 82 | Resp 16 | Ht 66.0 in | Wt 138.0 lb

## 2022-02-26 DIAGNOSIS — N183 Chronic kidney disease, stage 3 unspecified: Secondary | ICD-10-CM | POA: Diagnosis not present

## 2022-02-26 DIAGNOSIS — E785 Hyperlipidemia, unspecified: Secondary | ICD-10-CM

## 2022-02-26 DIAGNOSIS — N1831 Chronic kidney disease, stage 3a: Secondary | ICD-10-CM

## 2022-02-26 DIAGNOSIS — F3340 Major depressive disorder, recurrent, in remission, unspecified: Secondary | ICD-10-CM

## 2022-02-26 DIAGNOSIS — E1122 Type 2 diabetes mellitus with diabetic chronic kidney disease: Secondary | ICD-10-CM

## 2022-02-26 DIAGNOSIS — I83811 Varicose veins of right lower extremities with pain: Secondary | ICD-10-CM | POA: Diagnosis not present

## 2022-02-26 DIAGNOSIS — I129 Hypertensive chronic kidney disease with stage 1 through stage 4 chronic kidney disease, or unspecified chronic kidney disease: Secondary | ICD-10-CM | POA: Diagnosis not present

## 2022-02-26 DIAGNOSIS — I10 Essential (primary) hypertension: Secondary | ICD-10-CM

## 2022-02-26 DIAGNOSIS — N2889 Other specified disorders of kidney and ureter: Secondary | ICD-10-CM

## 2022-02-26 DIAGNOSIS — M170 Bilateral primary osteoarthritis of knee: Secondary | ICD-10-CM | POA: Diagnosis not present

## 2022-02-26 LAB — POCT GLYCOSYLATED HEMOGLOBIN (HGB A1C): Hemoglobin A1C: 5.5 % (ref 4.0–5.6)

## 2022-02-26 MED ORDER — AMLODIPINE BESYLATE 2.5 MG PO TABS: 2.5000 mg | ORAL_TABLET | Freq: Every day | ORAL | 1 refills | Status: DC

## 2022-02-26 MED ORDER — ATORVASTATIN CALCIUM 40 MG PO TABS: 40.0000 mg | ORAL_TABLET | Freq: Every day | ORAL | 1 refills | Status: DC

## 2022-02-26 MED ORDER — TELMISARTAN 40 MG PO TABS: 40.0000 mg | ORAL_TABLET | Freq: Every day | ORAL | 1 refills | Status: DC

## 2022-04-06 ENCOUNTER — Other Ambulatory Visit: Payer: Self-pay | Admitting: Family Medicine

## 2022-04-06 DIAGNOSIS — N183 Chronic kidney disease, stage 3 unspecified: Secondary | ICD-10-CM

## 2022-04-06 DIAGNOSIS — I10 Essential (primary) hypertension: Secondary | ICD-10-CM

## 2022-06-16 ENCOUNTER — Ambulatory Visit (INDEPENDENT_AMBULATORY_CARE_PROVIDER_SITE_OTHER): Payer: Medicare Other | Admitting: Podiatry

## 2022-06-16 ENCOUNTER — Encounter: Payer: Self-pay | Admitting: Podiatry

## 2022-06-16 VITALS — BP 162/92 | HR 86

## 2022-06-16 DIAGNOSIS — M79675 Pain in left toe(s): Secondary | ICD-10-CM | POA: Diagnosis not present

## 2022-06-16 DIAGNOSIS — B351 Tinea unguium: Secondary | ICD-10-CM | POA: Diagnosis not present

## 2022-06-16 DIAGNOSIS — M79674 Pain in right toe(s): Secondary | ICD-10-CM

## 2022-06-16 DIAGNOSIS — E1122 Type 2 diabetes mellitus with diabetic chronic kidney disease: Secondary | ICD-10-CM

## 2022-06-16 DIAGNOSIS — N183 Chronic kidney disease, stage 3 unspecified: Secondary | ICD-10-CM

## 2022-06-16 DIAGNOSIS — I872 Venous insufficiency (chronic) (peripheral): Secondary | ICD-10-CM

## 2022-06-16 NOTE — Progress Notes (Signed)
This patient returns to my office for at risk foot care.  This patient requires this care by a professional since this patient will be at risk due to having chronic kidney disease and chronic venous insufficiency.  This patient is unable to cut nails herself since the patient cannot reach her nails.These nails are painful walking and wearing shoes.  This patient presents for at risk foot care today.  General Appearance  Alert, conversant and in no acute stress.  Vascular  Dorsalis pedis and posterior tibial  pulses are weakly  palpable  bilaterally.  Capillary return is within normal limits  bilaterally. Temperature is within normal limits  bilaterally.  Neurologic  Senn-Weinstein monofilament wire test within normal limits  bilaterally. Muscle power within normal limits bilaterally.  Nails Thick disfigured discolored nails with subungual debris  from hallux to fifth toes bilaterally. No evidence of bacterial infection or drainage bilaterally.  Orthopedic  No limitations of motion  feet .  No crepitus or effusions noted.  No bony pathology or digital deformities noted.  Skin  normotropic skin with no porokeratosis noted bilaterally.  No signs of infections or ulcers noted.   Clavi 3rd toe left foot.  Onychomycosis  Pain in right toes  Pain in left toes    Consent was obtained for treatment procedures.   Mechanical debridement of nails 1-5  bilaterally performed with a nail nipper.  Filed with dremel without incident.  Debride clavi with dremel tool.   Return office visit  3 months                   Told patient to return for periodic foot care and evaluation due to potential at risk complications.   Helane Gunther DPM

## 2022-06-23 DIAGNOSIS — I10 Essential (primary) hypertension: Secondary | ICD-10-CM | POA: Diagnosis not present

## 2022-06-23 DIAGNOSIS — N1831 Chronic kidney disease, stage 3a: Secondary | ICD-10-CM | POA: Diagnosis not present

## 2022-06-23 DIAGNOSIS — R600 Localized edema: Secondary | ICD-10-CM | POA: Diagnosis not present

## 2022-06-23 DIAGNOSIS — I129 Hypertensive chronic kidney disease with stage 1 through stage 4 chronic kidney disease, or unspecified chronic kidney disease: Secondary | ICD-10-CM | POA: Diagnosis not present

## 2022-08-28 NOTE — Progress Notes (Signed)
Name: Melanie Gray   MRN: [id number]    DOB: [birth year]   Date:08/29/2022       Progress Note  Subjective  Chief Complaint  Follow Up  HPI  HTN: she is taking micardis 40 mg  and norvasc 2.5 mg in the mornings, but she has urinary urgency and incontinence so she skips medications when out of the house. Advised to switch medications to the evening   Diabetes with CKI stage III on ARB, she sees a nephrologist last urine micro was done 06/2022  and it was normal, GFR was 44  . She denies polyphagia, polydipsia or polyuria but has urinary frequency and nocturia. She follows a diabetic diet. HgbA1C has been within normal limits. Glucose is still well controlled   Hyperlipidemia: last LDL was down from 96 to 78, she has been off pravastatin , we sent rosuvastatin but it was too costly and she never filled medication, discussed Good RX, she has been taking Atorvastatin for cholesterol now and doing well    Major Depression: she had depression on an off throughout her life, really struggled after her husband died and she lost weight, had lack of motivation for years, but she has been doing well now, she states she is an introvert, she is socializing over the phone. Her son has moved in with her early 2022 because she was afraid after her fall in 2021. He takes her to her appointments.    Varicose veins:  she wears compression stocking hoses now and seems to be helping with lower extremity edema. Stable    Urge incontinence: she switched to decaf , she continues to have urinary frequency and urgency, going on for years, wearing depends, avoids leaving the house to eat with relatives. Discussed medications    OA knee: history of arthroscopic surgery of right knee , she still uses a cane and walks slowly. Right knee has mild effusion intermittently , she walks very slowly but stable   Patient Active Problem List   Diagnosis Date Noted   Pain due to onychomycosis of toenails of both feet 08/30/2020    Injury of toenail of right foot 08/30/2020   Edema of lower extremity 12/05/2019   Depression, major, recurrent, in remission ([place]) 08/10/2017   Varicose veins of both lower extremities with inflammation 12/01/2016   Chronic venous insufficiency 12/01/2016   Benign hypertension 11/06/2014   Chronic anemia 11/06/2014   Seborrheic dermatitis 11/06/2014   Dyslipidemia 11/06/2014   Cephalalgia 11/06/2014   Deafness, sensorineural 11/06/2014   Arthritis of knee, degenerative 11/06/2014   Panic attack 11/06/2014   Restless legs syndrome 11/06/2014   Allergic rhinitis 11/06/2014   Spinal stenosis 11/06/2014   Osteopenia 05/09/2009   Chronic kidney disease (CKD), stage III (moderate) ([place]) 04/20/2008    Past Surgical History:  Procedure Laterality Date   ABDOMINAL HYSTERECTOMY     CATARACT EXTRACTION W/PHACO Left 07/16/2017   Procedure: CATARACT EXTRACTION PHACO AND INTRAOCULAR LENS PLACEMENT ([place]);  Surgeon: Eulogio Bear, MD;  Location: [facility] ORS;  Service: Ophthalmology;  Laterality: Left;  [place] 01:27.0 AP% 21.0 CDE 18.28 Fluid Pack Lot # [id number]   CHOLECYSTECTOMY     HERNIA REPAIR      Family History  Problem Relation Age of Onset   Cancer Mother    Diabetes Father    Cancer Sister    Breast cancer Neg Hx     Social History   Tobacco Use   Smoking status: Never   Smokeless tobacco: Never  Tobacco comments:    smoking cessation materials not required  Substance Use Topics   Alcohol use: No    Alcohol/week: 0.0 standard drinks of alcohol     Current Outpatient Medications:    acetaminophen (TYLENOL) 500 MG tablet, Take 1,000 mg by mouth daily as needed for moderate pain or headache., Disp: , Rfl:    amLODipine (NORVASC) 2.5 MG tablet, TAKE 1 TABLET BY MOUTH EVERY DAY, Disp: 90 tablet, Rfl: 1   aspirin EC 81 MG tablet, Take 81 mg by mouth daily., Disp: , Rfl:    atorvastatin (LIPITOR) 40 MG tablet, Take 1 tablet (40 mg total) by mouth daily., Disp: 90 tablet, Rfl:  1   cholecalciferol (VITAMIN D3) 25 MCG (1000 UNIT) tablet, Take 1,000 Units by mouth 2 (two) times daily., Disp: , Rfl:    diclofenac Sodium (VOLTAREN) 1 % GEL, Apply 4 g topically 4 (four) times daily., Disp: 300 g, Rfl: 1   Glucosamine-Chondroitin (OSTEO BI-FLEX REGULAR STRENGTH PO), Take 1 tablet by mouth daily., Disp: , Rfl:    Multiple Vitamin (MULTI-VITAMINS) TABS, Take 1 tablet by mouth every evening. , Disp: , Rfl:    Polyethyl Glycol-Propyl Glycol (SYSTANE OP), Apply 1 drop to eye daily as needed (dry eyes)., Disp: , Rfl:    telmisartan (MICARDIS) 40 MG tablet, TAKE 1 TABLET BY MOUTH EVERY DAY, Disp: 90 tablet, Rfl: 1  Allergies  Allergen Reactions   Sulfa Antibiotics Rash    I personally reviewed active problem list, medication list, allergies, family history, social history, health maintenance with the patient/caregiver today.   ROS  Ten systems reviewed and is negative except as mentioned in HPI   Objective  Vitals:   08/29/22 1041 08/29/22 1050  BP: (!) 144/92 (!) 144/86  Pulse: 92   Resp: 16   SpO2: 98%   Weight: 137 lb (62.1 kg)   Height: 5' 5"$  (1.651 m)     Body mass index is 22.8 kg/m.  Physical Exam  Constitutional: Patient appears well-developed and well-nourished.  No distress.  HEENT: head atraumatic, normocephalic, pupils equal and reactive to light, neck supple Cardiovascular: Normal rate, regular rhythm and normal heart sounds.  No murmur heard. Trace  BLE edema. Pulmonary/Chest: Effort normal and breath sounds normal. No respiratory distress. Abdominal: Soft.  There is no tenderness. Psychiatric: Patient has a normal mood and affect. behavior is normal. Judgment and thought content normal.   Recent Results (from the past 2160 hour(s))  POCT HgB A1C     Status: Abnormal   Collection Time: 08/29/22 10:51 AM  Result Value Ref Range   Hemoglobin A1C 5.8 (A) 4.0 - 5.6 %   HbA1c POC (<> result, manual entry)     HbA1c, POC (prediabetic range)      HbA1c, POC (controlled diabetic range)       PHQ2/9:    08/29/2022   10:44 AM 02/26/2022   11:27 AM 12/24/2021    1:17 PM 08/28/2021   10:56 AM 07/30/2021    2:58 PM  Depression screen PHQ 2/9  Decreased Interest 0 0 0 0 0  Down, Depressed, Hopeless 0 0 0 0 0  PHQ - 2 Score 0 0 0 0 0  Altered sleeping 1 1  0   Tired, decreased energy 0 0  0   Change in appetite 0 0  0   Feeling bad or failure about yourself  1 0  0   Trouble concentrating 0 0  0   Moving slowly or  fidgety/restless 0 0  0   Suicidal thoughts 0 0  0   PHQ-9 Score 2 1  0     phq 9 is negative   Fall Risk:    08/29/2022   10:41 AM 02/26/2022   11:27 AM 12/24/2021    1:21 PM 08/28/2021   10:56 AM 07/30/2021    2:58 PM  Fall Risk   Falls in the past year? 0 0 0 0 0  Number falls in past yr: 0 0 0 0 0  Injury with Fall? 0 0 0 0 0  Risk for fall due to : Impaired balance/gait Impaired balance/gait No Fall Risks Impaired balance/gait   Follow up Falls prevention discussed Falls prevention discussed Falls prevention discussed Falls prevention discussed Falls evaluation completed      Functional Status Survey: Is the patient deaf or have difficulty hearing?: No Does the patient have difficulty seeing, even when wearing glasses/contacts?: Yes Does the patient have difficulty concentrating, remembering, or making decisions?: No Does the patient have difficulty walking or climbing stairs?: Yes Does the patient have difficulty dressing or bathing?: No Does the patient have difficulty doing errands alone such as visiting a doctor's office or shopping?: Yes    Assessment & Plan  1. Type 2 diabetes mellitus with stage 3a chronic kidney disease, without long-term current use of insulin (HCC)  - POCT HgB A1C - amLODipine (NORVASC) 2.5 MG tablet; Take 1 tablet (2.5 mg total) by mouth daily.  Dispense: 90 tablet; Refill: 1  2. Chronic kidney insufficiency, stage 3 (moderate) (HCC)  - telmisartan (MICARDIS) 40 MG  tablet; Take 1 tablet (40 mg total) by mouth daily.  Dispense: 90 tablet; Refill: 1  3. Need for immunization against influenza  - Flu Vaccine QUAD High Dose(Fluad)  4. Dyslipidemia  - atorvastatin (LIPITOR) 40 MG tablet; Take 1 tablet (40 mg total) by mouth daily.  Dispense: 90 tablet; Refill: 1 - Lipid panel - Hepatic function panel  5. Urge incontinence of urine  - tolterodine (DETROL LA) 2 MG 24 hr capsule; Take 1-2 capsules (2-4 mg total) by mouth daily.  Dispense: 30 capsule; Refill: 0  6. Benign hypertension  - atorvastatin (LIPITOR) 40 MG tablet; Take 1 tablet (40 mg total) by mouth daily.  Dispense: 90 tablet; Refill: 1 - telmisartan (MICARDIS) 40 MG tablet; Take 1 tablet (40 mg total) by mouth daily.  Dispense: 90 tablet; Refill: 1

## 2022-08-29 ENCOUNTER — Ambulatory Visit (INDEPENDENT_AMBULATORY_CARE_PROVIDER_SITE_OTHER): Payer: Medicare Other | Admitting: Family Medicine

## 2022-08-29 ENCOUNTER — Encounter: Payer: Self-pay | Admitting: Family Medicine

## 2022-08-29 VITALS — BP 144/86 | HR 92 | Resp 16 | Ht 65.0 in | Wt 137.0 lb

## 2022-08-29 DIAGNOSIS — E1122 Type 2 diabetes mellitus with diabetic chronic kidney disease: Secondary | ICD-10-CM | POA: Diagnosis not present

## 2022-08-29 DIAGNOSIS — I10 Essential (primary) hypertension: Secondary | ICD-10-CM

## 2022-08-29 DIAGNOSIS — N183 Chronic kidney disease, stage 3 unspecified: Secondary | ICD-10-CM | POA: Diagnosis not present

## 2022-08-29 DIAGNOSIS — Z23 Encounter for immunization: Secondary | ICD-10-CM | POA: Diagnosis not present

## 2022-08-29 DIAGNOSIS — N3941 Urge incontinence: Secondary | ICD-10-CM

## 2022-08-29 DIAGNOSIS — N1831 Chronic kidney disease, stage 3a: Secondary | ICD-10-CM | POA: Diagnosis not present

## 2022-08-29 DIAGNOSIS — E785 Hyperlipidemia, unspecified: Secondary | ICD-10-CM

## 2022-08-29 LAB — POCT GLYCOSYLATED HEMOGLOBIN (HGB A1C): Hemoglobin A1C: 5.8 % — AB (ref 4.0–5.6)

## 2022-08-29 MED ORDER — AMLODIPINE BESYLATE 2.5 MG PO TABS: 2.5000 mg | ORAL_TABLET | Freq: Every day | ORAL | 1 refills | Status: DC

## 2022-08-29 MED ORDER — TELMISARTAN 40 MG PO TABS: 40.0000 mg | ORAL_TABLET | Freq: Every day | ORAL | 1 refills | Status: DC

## 2022-08-29 MED ORDER — ATORVASTATIN CALCIUM 40 MG PO TABS: 40.0000 mg | ORAL_TABLET | Freq: Every day | ORAL | 1 refills | Status: DC

## 2022-08-29 MED ORDER — TOLTERODINE TARTRATE ER 2 MG PO CP24: 2.0000 mg | ORAL_CAPSULE | Freq: Every day | ORAL | 0 refills | Status: DC

## 2022-08-29 NOTE — Patient Instructions (Addendum)
You need to get Tdap and RSV at local pharmacy   Start taking Telmisartan and amlodipine and Atorvastatin at night   I am sending rx for Detrol LA for your bladder, you can take that in the morning

## 2022-08-30 LAB — HEPATIC FUNCTION PANEL
AG Ratio: 1.5 (calc) (ref 1.0–2.5)
ALT: 20 U/L (ref 6–29)
AST: 28 U/L (ref 10–35)
Albumin: 4.4 g/dL (ref 3.6–5.1)
Alkaline phosphatase (APISO): 105 U/L (ref 37–153)
Bilirubin, Direct: 0.1 mg/dL (ref 0.0–0.2)
Globulin: 3 g/dL (calc) (ref 1.9–3.7)
Indirect Bilirubin: 0.4 mg/dL (calc) (ref 0.2–1.2)
Total Bilirubin: 0.5 mg/dL (ref 0.2–1.2)
Total Protein: 7.4 g/dL (ref 6.1–8.1)

## 2022-08-30 LAB — LIPID PANEL
Cholesterol: 183 mg/dL (ref <200)
HDL: 95 mg/dL (ref >=50)
LDL Cholesterol (Calc): 75 mg/dL (calc)
Non-HDL Cholesterol (Calc): 88 mg/dL (calc) (ref <130)
Total CHOL/HDL Ratio: 1.9 (calc) (ref <5.0)
Triglycerides: 45 mg/dL (ref <150)

## 2022-09-04 ENCOUNTER — Ambulatory Visit: Payer: Self-pay

## 2022-09-04 NOTE — Telephone Encounter (Signed)
Summary: diarrhea   Pt stated has a possible virus since Sunday has diarrhea stated last night it was bad and feels weak. Pt denies abdominal pain. Pt is asking what she should do and what she should eat. Stated her son is at home with the diarrhea as well.  Hasn't been to the bathroom today.   Seeking clinical advice.         Chief Complaint: Diarrhea. None today. Symptoms: Diarrhea Frequency: Sunday of Monday Pertinent Negatives: Patient denies vomiting Disposition: []$ ED /[]$ Urgent Care (no appt availability in office) / []$ Appointment(In office/virtual)/ []$  [facility]/ [x]$ Home Care/ []$ Refused Recommended Disposition /[]$  Mobile Bus/ [x]$  Follow-up with PCP Additional Notes: Pt. Will try to eat some toast, is drinking fluids well. "I'm a little better today." Will try bland diet. Answer Assessment - Initial Assessment Questions 1. DIARRHEA SEVERITY: "How bad is the diarrhea?" "How many more stools have you had in the past 24 hours than normal?"    - NO DIARRHEA (SCALE 0)   - MILD (SCALE 1-3): Few loose or mushy BMs; increase of 1-3 stools over normal daily number of stools; mild increase in ostomy output.   -  MODERATE (SCALE 4-7): Increase of 4-6 stools daily over normal; moderate increase in ostomy output.   -  SEVERE (SCALE 8-10; OR "WORST POSSIBLE"): Increase of 7 or more stools daily over normal; moderate increase in ostomy output; incontinence.     Moderate 2. ONSET: "When did the diarrhea begin?"      Sunday 3. BM CONSISTENCY: "How loose or watery is the diarrhea?"      watery 4. VOMITING: "Are you also vomiting?" If Yes, ask: "How many times in the past 24 hours?"      No 5. ABDOMEN PAIN: "Are you having any abdomen pain?" If Yes, ask: "What does it feel like?" (e.g., crampy, dull, intermittent, constant)      No 6. ABDOMEN PAIN SEVERITY: If present, ask: "How bad is the pain?"  (e.g., Scale 1-10; mild, moderate, or severe)   - MILD (1-3): doesn't  interfere with normal activities, abdomen soft and not tender to touch    - MODERATE (4-7): interferes with normal activities or awakens from sleep, abdomen tender to touch    - SEVERE (8-10): excruciating pain, doubled over, unable to do any normal activities       No 7. ORAL INTAKE: If vomiting, "Have you been able to drink liquids?" "How much liquids have you had in the past 24 hours?"     Yes 8. HYDRATION: "Any signs of dehydration?" (e.g., dry mouth [not just dry lips], too weak to stand, dizziness, new weight loss) "When did you last urinate?"     No 9. EXPOSURE: "Have you traveled to a foreign country recently?" "Have you been exposed to anyone with diarrhea?" "Could you have eaten any food that was spoiled?"     No 10. ANTIBIOTIC USE: "Are you taking antibiotics now or have you taken antibiotics in the past 2 months?"       No 11. OTHER SYMPTOMS: "Do you have any other symptoms?" (e.g., fever, blood in stool)       No 12. PREGNANCY: "Is there any chance you are pregnant?" "When was your last menstrual period?"       No  Protocols used: [facility]

## 2022-09-05 ENCOUNTER — Telehealth: Payer: Self-pay

## 2022-09-05 NOTE — Telephone Encounter (Signed)
Called and left voicemail informing patient to stay hydrated and watch her heart rate and bp. If symptoms worsen or signs of dehydration to go to ER

## 2022-09-05 NOTE — Telephone Encounter (Signed)
Pt called for lab results. Shared provider's note.   Steele Sizer, MD 09/01/2022  8:09 AM EST     Lipid panel is at goal , normal liver enzymes. Continue medications   Pt states that diarrhea has resolved and she is feeling better.

## 2022-09-18 ENCOUNTER — Ambulatory Visit: Payer: Medicare Other | Admitting: Podiatry

## 2022-12-07 ENCOUNTER — Inpatient Hospital Stay
Admission: EM | Admit: 2022-12-07 | Discharge: 2022-12-17 | DRG: 329 | Disposition: A | Discharge status: Home Health | Payer: Medicare Other | Attending: Internal Medicine | Admitting: Internal Medicine

## 2022-12-07 ENCOUNTER — Emergency Department: Payer: Medicare Other | Admitting: Anesthesiology

## 2022-12-07 ENCOUNTER — Other Ambulatory Visit: Payer: Self-pay

## 2022-12-07 ENCOUNTER — Encounter
Admission: EM | Disposition: A | Discharge status: Home Health | Payer: Self-pay | Source: Home / Self Care | Attending: Internal Medicine

## 2022-12-07 ENCOUNTER — Emergency Department: Payer: Medicare Other

## 2022-12-07 DIAGNOSIS — E1129 Type 2 diabetes mellitus with other diabetic kidney complication: Secondary | ICD-10-CM | POA: Diagnosis present

## 2022-12-07 DIAGNOSIS — N3941 Urge incontinence: Secondary | ICD-10-CM | POA: Diagnosis present

## 2022-12-07 DIAGNOSIS — K56609 Unspecified intestinal obstruction, unspecified as to partial versus complete obstruction: Principal | ICD-10-CM | POA: Diagnosis present

## 2022-12-07 DIAGNOSIS — D649 Anemia, unspecified: Secondary | ICD-10-CM | POA: Diagnosis present

## 2022-12-07 DIAGNOSIS — D62 Acute posthemorrhagic anemia: Secondary | ICD-10-CM | POA: Diagnosis not present

## 2022-12-07 DIAGNOSIS — G35 Multiple sclerosis: Secondary | ICD-10-CM | POA: Diagnosis present

## 2022-12-07 DIAGNOSIS — E785 Hyperlipidemia, unspecified: Secondary | ICD-10-CM | POA: Diagnosis present

## 2022-12-07 DIAGNOSIS — N1831 Chronic kidney disease, stage 3a: Secondary | ICD-10-CM | POA: Diagnosis present

## 2022-12-07 DIAGNOSIS — E440 Moderate protein-calorie malnutrition: Secondary | ICD-10-CM | POA: Diagnosis not present

## 2022-12-07 DIAGNOSIS — R188 Other ascites: Secondary | ICD-10-CM | POA: Diagnosis present

## 2022-12-07 DIAGNOSIS — Z90710 Acquired absence of both cervix and uterus: Secondary | ICD-10-CM

## 2022-12-07 DIAGNOSIS — Z9889 Other specified postprocedural states: Secondary | ICD-10-CM

## 2022-12-07 DIAGNOSIS — E870 Hyperosmolality and hypernatremia: Secondary | ICD-10-CM | POA: Diagnosis not present

## 2022-12-07 DIAGNOSIS — Z79899 Other long term (current) drug therapy: Secondary | ICD-10-CM

## 2022-12-07 DIAGNOSIS — K559 Vascular disorder of intestine, unspecified: Secondary | ICD-10-CM | POA: Diagnosis present

## 2022-12-07 DIAGNOSIS — R001 Bradycardia, unspecified: Secondary | ICD-10-CM | POA: Diagnosis not present

## 2022-12-07 DIAGNOSIS — Z833 Family history of diabetes mellitus: Secondary | ICD-10-CM

## 2022-12-07 DIAGNOSIS — I10 Essential (primary) hypertension: Secondary | ICD-10-CM | POA: Diagnosis not present

## 2022-12-07 DIAGNOSIS — Z882 Allergy status to sulfonamides status: Secondary | ICD-10-CM

## 2022-12-07 DIAGNOSIS — E872 Acidosis, unspecified: Secondary | ICD-10-CM | POA: Diagnosis present

## 2022-12-07 DIAGNOSIS — K659 Peritonitis, unspecified: Secondary | ICD-10-CM | POA: Diagnosis present

## 2022-12-07 DIAGNOSIS — Z6822 Body mass index (BMI) 22.0-22.9, adult: Secondary | ICD-10-CM

## 2022-12-07 DIAGNOSIS — I129 Hypertensive chronic kidney disease with stage 1 through stage 4 chronic kidney disease, or unspecified chronic kidney disease: Secondary | ICD-10-CM | POA: Diagnosis present

## 2022-12-07 DIAGNOSIS — E1122 Type 2 diabetes mellitus with diabetic chronic kidney disease: Secondary | ICD-10-CM | POA: Diagnosis present

## 2022-12-07 DIAGNOSIS — F32A Depression, unspecified: Secondary | ICD-10-CM | POA: Diagnosis present

## 2022-12-07 DIAGNOSIS — K460 Unspecified abdominal hernia with obstruction, without gangrene: Secondary | ICD-10-CM | POA: Diagnosis not present

## 2022-12-07 DIAGNOSIS — K567 Ileus, unspecified: Secondary | ICD-10-CM | POA: Diagnosis not present

## 2022-12-07 DIAGNOSIS — Z4682 Encounter for fitting and adjustment of non-vascular catheter: Secondary | ICD-10-CM | POA: Diagnosis not present

## 2022-12-07 DIAGNOSIS — K668 Other specified disorders of peritoneum: Secondary | ICD-10-CM | POA: Diagnosis not present

## 2022-12-07 DIAGNOSIS — Z7982 Long term (current) use of aspirin: Secondary | ICD-10-CM

## 2022-12-07 DIAGNOSIS — R100 Acute abdomen: Secondary | ICD-10-CM | POA: Diagnosis present

## 2022-12-07 DIAGNOSIS — Z9049 Acquired absence of other specified parts of digestive tract: Secondary | ICD-10-CM | POA: Diagnosis not present

## 2022-12-07 DIAGNOSIS — K461 Unspecified abdominal hernia with gangrene: Secondary | ICD-10-CM | POA: Diagnosis present

## 2022-12-07 DIAGNOSIS — R109 Unspecified abdominal pain: Secondary | ICD-10-CM | POA: Diagnosis not present

## 2022-12-07 DIAGNOSIS — E876 Hypokalemia: Secondary | ICD-10-CM | POA: Diagnosis present

## 2022-12-07 DIAGNOSIS — R918 Other nonspecific abnormal finding of lung field: Secondary | ICD-10-CM | POA: Diagnosis not present

## 2022-12-07 DIAGNOSIS — D72829 Elevated white blood cell count, unspecified: Secondary | ICD-10-CM | POA: Diagnosis not present

## 2022-12-07 DIAGNOSIS — Z4659 Encounter for fitting and adjustment of other gastrointestinal appliance and device: Secondary | ICD-10-CM | POA: Diagnosis not present

## 2022-12-07 HISTORY — PX: BOWEL RESECTION: SHX1257

## 2022-12-07 HISTORY — PX: LAPAROTOMY: SHX154

## 2022-12-07 LAB — COMPREHENSIVE METABOLIC PANEL
ALT: 21 U/L (ref 0–44)
AST: 39 U/L (ref 15–41)
Albumin: 4.9 g/dL (ref 3.5–5.0)
Alkaline Phosphatase: 88 U/L (ref 38–126)
Anion gap: 15 (ref 5–15)
BUN: 22 mg/dL (ref 8–23)
CO2: 21 mmol/L — ABNORMAL LOW (ref 22–32)
Calcium: 9.6 mg/dL (ref 8.9–10.3)
Chloride: 105 mmol/L (ref 98–111)
Creatinine, Ser: 1.06 mg/dL — ABNORMAL HIGH (ref 0.44–1.00)
GFR, Estimated: 52 mL/min — ABNORMAL LOW (ref >60)
Glucose, Bld: 209 mg/dL — ABNORMAL HIGH (ref 70–99)
Potassium: 3.3 mmol/L — ABNORMAL LOW (ref 3.5–5.1)
Sodium: 141 mmol/L (ref 135–145)
Total Bilirubin: 1.2 mg/dL (ref 0.3–1.2)
Total Protein: 8 g/dL (ref 6.5–8.1)

## 2022-12-07 LAB — CBC
HCT: 35.8 % — ABNORMAL LOW (ref 36.0–46.0)
Hemoglobin: 11.6 g/dL — ABNORMAL LOW (ref 12.0–15.0)
MCH: 28.7 pg (ref 26.0–34.0)
MCHC: 32.4 g/dL (ref 30.0–36.0)
MCV: 88.6 fL (ref 80.0–100.0)
Platelets: 253 10*3/uL (ref 150–400)
RBC: 4.04 MIL/uL (ref 3.87–5.11)
RDW: 17 % — ABNORMAL HIGH (ref 11.5–15.5)
WBC: 11 10*3/uL — ABNORMAL HIGH (ref 4.0–10.5)
nRBC: 0 % (ref 0.0–0.2)

## 2022-12-07 LAB — TYPE AND SCREEN

## 2022-12-07 LAB — TROPONIN I (HIGH SENSITIVITY): Troponin I (High Sensitivity): 10 ng/L (ref <18)

## 2022-12-07 LAB — LIPASE, BLOOD: Lipase: 31 U/L (ref 11–51)

## 2022-12-07 SURGERY — LAPAROTOMY, EXPLORATORY
Anesthesia: General

## 2022-12-07 MED ORDER — DEXMEDETOMIDINE HCL IN NACL 80 MCG/20ML IV SOLN
INTRAVENOUS | Status: AC
  Filled 2022-12-07: qty 20

## 2022-12-07 MED ORDER — ROCURONIUM BROMIDE 10 MG/ML (PF) SYRINGE
PREFILLED_SYRINGE | INTRAVENOUS | Status: AC
  Filled 2022-12-07: qty 10

## 2022-12-07 MED ORDER — LIDOCAINE VISCOUS HCL 2 % MT SOLN
15.0000 mL | Freq: Once | OROMUCOSAL | Status: AC
  Administered 2022-12-07: 15 mL via OROMUCOSAL
  Filled 2022-12-07: qty 15

## 2022-12-07 MED ORDER — HYDROMORPHONE HCL 1 MG/ML IJ SOLN
1.0000 mg | Freq: Once | INTRAMUSCULAR | Status: DC
  Filled 2022-12-07: qty 1

## 2022-12-07 MED ORDER — PHENYLEPHRINE HCL-NACL 20-0.9 MG/250ML-% IV SOLN
INTRAVENOUS | Status: DC | PRN
  Administered 2022-12-07: 40 ug/min via INTRAVENOUS

## 2022-12-07 MED ORDER — BUPIVACAINE HCL (PF) 0.25 % IJ SOLN
INTRAMUSCULAR | Status: AC
  Filled 2022-12-07: qty 30

## 2022-12-07 MED ORDER — BUPIVACAINE LIPOSOME 1.3 % IJ SUSP
INTRAMUSCULAR | Status: AC
  Filled 2022-12-07: qty 20

## 2022-12-07 MED ORDER — EPINEPHRINE PF 1 MG/ML IJ SOLN
INTRAMUSCULAR | Status: AC
  Filled 2022-12-07: qty 1

## 2022-12-07 MED ORDER — LIDOCAINE HCL (CARDIAC) PF 100 MG/5ML IV SOSY
PREFILLED_SYRINGE | INTRAVENOUS | Status: DC | PRN
  Administered 2022-12-07: 60 mg via INTRAVENOUS

## 2022-12-07 MED ORDER — LIDOCAINE HCL (PF) 2 % IJ SOLN
INTRAMUSCULAR | Status: AC
  Filled 2022-12-07: qty 5

## 2022-12-07 MED ORDER — CEFAZOLIN SODIUM-DEXTROSE 2-3 GM-%(50ML) IV SOLR
INTRAVENOUS | Status: DC | PRN
  Administered 2022-12-07: 2 g via INTRAVENOUS

## 2022-12-07 MED ORDER — ONDANSETRON HCL 4 MG/2ML IJ SOLN
INTRAMUSCULAR | Status: AC
  Filled 2022-12-07: qty 2

## 2022-12-07 MED ORDER — SODIUM CHLORIDE 0.9 % IV BOLUS
1000.0000 mL | Freq: Once | INTRAVENOUS | Status: AC
  Administered 2022-12-07: 1000 mL via INTRAVENOUS

## 2022-12-07 MED ORDER — METRONIDAZOLE 500 MG/100ML IV SOLN
500.0000 mg | Freq: Once | INTRAVENOUS | Status: AC
  Administered 2022-12-08: 500 mg via INTRAVENOUS
  Filled 2022-12-07: qty 100

## 2022-12-07 MED ORDER — PROPOFOL 10 MG/ML IV BOLUS
INTRAVENOUS | Status: DC | PRN
  Administered 2022-12-07: 100 mg via INTRAVENOUS

## 2022-12-07 MED ORDER — GLYCOPYRROLATE 0.2 MG/ML IJ SOLN
INTRAMUSCULAR | Status: AC
  Filled 2022-12-07: qty 1

## 2022-12-07 MED ORDER — FENTANYL CITRATE (PF) 100 MCG/2ML IJ SOLN
INTRAMUSCULAR | Status: DC | PRN
  Administered 2022-12-07 (×2): 50 ug via INTRAVENOUS

## 2022-12-07 MED ORDER — ONDANSETRON HCL 4 MG/2ML IJ SOLN
4.0000 mg | Freq: Once | INTRAMUSCULAR | Status: AC
  Administered 2022-12-07: 4 mg via INTRAVENOUS
  Filled 2022-12-07: qty 2

## 2022-12-07 MED ORDER — SUCCINYLCHOLINE CHLORIDE 200 MG/10ML IV SOSY
PREFILLED_SYRINGE | INTRAVENOUS | Status: DC | PRN
  Administered 2022-12-07: 100 mg via INTRAVENOUS

## 2022-12-07 MED ORDER — FENTANYL CITRATE (PF) 100 MCG/2ML IJ SOLN
INTRAMUSCULAR | Status: AC
  Filled 2022-12-07: qty 2

## 2022-12-07 MED ORDER — GLYCOPYRROLATE 0.2 MG/ML IJ SOLN
INTRAMUSCULAR | Status: DC | PRN
  Administered 2022-12-07: .1 mg via INTRAVENOUS

## 2022-12-07 MED ORDER — ROCURONIUM BROMIDE 100 MG/10ML IV SOLN
INTRAVENOUS | Status: DC | PRN
  Administered 2022-12-07: 30 mg via INTRAVENOUS

## 2022-12-07 MED ORDER — LACTATED RINGERS IV SOLN: INTRAVENOUS | Status: DC | PRN

## 2022-12-07 MED ORDER — IOHEXOL 300 MG/ML  SOLN
80.0000 mL | Freq: Once | INTRAMUSCULAR | Status: AC | PRN
  Administered 2022-12-07: 80 mL via INTRAVENOUS

## 2022-12-07 MED ORDER — MORPHINE SULFATE (PF) 4 MG/ML IV SOLN
4.0000 mg | Freq: Once | INTRAVENOUS | Status: AC
  Administered 2022-12-07: 4 mg via INTRAVENOUS
  Filled 2022-12-07: qty 1

## 2022-12-07 MED ORDER — PROPOFOL 10 MG/ML IV BOLUS
INTRAVENOUS | Status: AC
  Filled 2022-12-07: qty 20

## 2022-12-07 MED ORDER — SUCCINYLCHOLINE CHLORIDE 200 MG/10ML IV SOSY
PREFILLED_SYRINGE | INTRAVENOUS | Status: AC
  Filled 2022-12-07: qty 10

## 2022-12-07 SURGICAL SUPPLY — 43 items
APPLIER CLIP 11 MED OPEN (CLIP)
APPLIER CLIP 13 LRG OPEN (CLIP)
BARRIER ADH SEPRAFILM 3INX5IN (MISCELLANEOUS) IMPLANT
BLADE CLIPPER SURG (BLADE) ×1 IMPLANT
CHLORAPREP W/TINT 26 (MISCELLANEOUS) ×1 IMPLANT
CLIP APPLIE 11 MED OPEN (CLIP) IMPLANT
CLIP APPLIE 13 LRG OPEN (CLIP) IMPLANT
COVER BACK TABLE REUSABLE LG (DRAPES) IMPLANT
DRAPE LAPAROTOMY 100X77 ABD (DRAPES) ×1 IMPLANT
DRSG OPSITE POSTOP 4X10 (GAUZE/BANDAGES/DRESSINGS) IMPLANT
DRSG OPSITE POSTOP 4X12 (GAUZE/BANDAGES/DRESSINGS) IMPLANT
ELECT BLADE 6.5 EXT (BLADE) ×1 IMPLANT
ELECT REM PT RETURN 9FT ADLT (ELECTROSURGICAL) ×1
ELECTRODE REM PT RTRN 9FT ADLT (ELECTROSURGICAL) ×1 IMPLANT
GLOVE BIO SURGEON STRL SZ7 (GLOVE) ×2 IMPLANT
GOWN STRL REUS W/ TWL LRG LVL3 (GOWN DISPOSABLE) ×2 IMPLANT
GOWN STRL REUS W/TWL LRG LVL3 (GOWN DISPOSABLE) ×2
LIGASURE IMPACT 36 18CM CVD LR (INSTRUMENTS) IMPLANT
MANIFOLD NEPTUNE II (INSTRUMENTS) ×1 IMPLANT
NDL HYPO 22X1.5 SAFETY MO (MISCELLANEOUS) ×2 IMPLANT
NEEDLE HYPO 22X1.5 SAFETY MO (MISCELLANEOUS) ×2 IMPLANT
PACK BASIN MAJOR ARMC (MISCELLANEOUS) ×1 IMPLANT
RELOAD PROXIMATE 75MM BLUE (ENDOMECHANICALS) ×3 IMPLANT
RELOAD STAPLE 75 3.8 BLU REG (ENDOMECHANICALS) IMPLANT
SPONGE T-LAP 18X18 ~~LOC~~+RFID (SPONGE) ×1 IMPLANT
SPONGE T-LAP 18X36 ~~LOC~~+RFID STR (SPONGE) IMPLANT
STAPLER PROXIMATE 75MM BLUE (STAPLE) IMPLANT
STAPLER SKIN PROX 35W (STAPLE) ×1 IMPLANT
SUT PDS AB 0 CT1 27 (SUTURE) ×3 IMPLANT
SUT SILK 2 0 (SUTURE) ×1
SUT SILK 2 0 SH CR/8 (SUTURE) ×1 IMPLANT
SUT SILK 2 0SH CR/8 30 (SUTURE) ×1 IMPLANT
SUT SILK 2-0 18XBRD TIE 12 (SUTURE) ×1 IMPLANT
SUT VIC AB 0 CT1 36 (SUTURE) ×2 IMPLANT
SUT VIC AB 2-0 SH 27 (SUTURE)
SUT VIC AB 2-0 SH 27XBRD (SUTURE) IMPLANT
SUT VIC AB 3-0 SH 27 (SUTURE) ×1
SUT VIC AB 3-0 SH 27X BRD (SUTURE) ×1 IMPLANT
SYR 20ML LL LF (SYRINGE) ×2 IMPLANT
SYR 3ML LL SCALE MARK (SYRINGE) ×1 IMPLANT
TRAP FLUID SMOKE EVACUATOR (MISCELLANEOUS) ×1 IMPLANT
TRAY FOLEY MTR SLVR 16FR STAT (SET/KITS/TRAYS/PACK) ×1 IMPLANT
WATER STERILE IRR 500ML POUR (IV SOLUTION) ×1 IMPLANT

## 2022-12-07 NOTE — ED Notes (Signed)
Surgeon in to see pt. Tube placed, draining bile. Pt tolerated procedure well. OR in team to take pt

## 2022-12-07 NOTE — H&P (Signed)
Patient ID: Melanie Gray, female   DOB: 1940-12-18, 82 y.o.   MRN: [id number]  HPI Melanie Gray is a 82 y.o. female seen at the request of Dr. Marisa Severin acute abdomen. She presents with an acute onset of abdominal pain that is severe diffuse and colicky.  She also had associated nausea and vomiting. No fevers no chills and no other symptoms.  She does have chronic kidney disease and follows with nephrology Surgical history includes abdominal hysterectomy, cholecystectomy and hernia repair. CT scan personally reviewed showing evidence of swirl sign concerning for internal hernia and closed-loop obstruction.  There is no free air White count is 11,000 with a hemoglobin of 11.6, pl 253k. Creat 1.06 Good cardiovascular reserve is able to perform more than 4 METS of activity without shortness of breath or chest pain.  She does not drive and her son drives her everywhere.  Son is at the bedside and is very supportive Currently she did have history also of multiple sclerosis, She walks with a cane ( slow) but no neurological deficits SHe had a prior history of hernia repair as well as cholecystectomy and hysterectomy   HPI  Past Medical History:  Diagnosis Date   Chronic kidney disease    RENAL INSUFF   Complication of anesthesia    HA , N/V   Diabetes mellitus without complication (HCC)    Hyperlipidemia    Hypertension    Knee instability    RIGHT KNEE WEAKNESS   PONV (postoperative nausea and vomiting)    Vertigo     Past Surgical History:  Procedure Laterality Date   ABDOMINAL HYSTERECTOMY     CATARACT EXTRACTION W/PHACO Left 07/16/2017   Procedure: CATARACT EXTRACTION PHACO AND INTRAOCULAR LENS PLACEMENT (IOC);  Surgeon: [name], MD;  Location: [facility] ORS;  Service: Ophthalmology;  Laterality: Left;  [place] 01:27.0 AP% 21.0 CDE 18.28 Fluid Pack Lot # [id number]   CHOLECYSTECTOMY     HERNIA REPAIR      Family History  Problem Relation Age of Onset   Cancer Mother    Diabetes  Father    Cancer Sister    Breast cancer Neg Hx     Social History Social History   Tobacco Use   Smoking status: Never   Smokeless tobacco: Never   Tobacco comments:    smoking cessation materials not required  Vaping Use   Vaping Use: Never used  Substance Use Topics   Alcohol use: No    Alcohol/week: 0.0 standard drinks of alcohol   Drug use: No    Allergies  Allergen Reactions   Sulfa Antibiotics Rash    Current Facility-Administered Medications  Medication Dose Route Frequency Provider Last Rate Last Admin   HYDROmorphone (DILAUDID) injection 1 mg  1 mg Intravenous Once Dionne Bucy, MD       Current Outpatient Medications  Medication Sig Dispense Refill   acetaminophen (TYLENOL) 500 MG tablet Take 1,000 mg by mouth daily as needed for moderate pain or headache.     amLODipine (NORVASC) 2.5 MG tablet Take 1 tablet (2.5 mg total) by mouth daily. 90 tablet 1   aspirin EC 81 MG tablet Take 81 mg by mouth daily.     atorvastatin (LIPITOR) 40 MG tablet Take 1 tablet (40 mg total) by mouth daily. 90 tablet 1   cholecalciferol (VITAMIN D3) 25 MCG (1000 UNIT) tablet Take 1,000 Units by mouth 2 (two) times daily.     diclofenac Sodium (VOLTAREN) 1 % GEL Apply 4 g topically  4 (four) times daily. 300 g 1   Glucosamine-Chondroitin (OSTEO BI-FLEX REGULAR STRENGTH PO) Take 1 tablet by mouth daily.     Multiple Vitamin (MULTI-VITAMINS) TABS Take 1 tablet by mouth every evening.      Polyethyl Glycol-Propyl Glycol (SYSTANE OP) Apply 1 drop to eye daily as needed (dry eyes).     telmisartan (MICARDIS) 40 MG tablet Take 1 tablet (40 mg total) by mouth daily. 90 tablet 1   tolterodine (DETROL LA) 2 MG 24 hr capsule Take 1-2 capsules (2-4 mg total) by mouth daily. 30 capsule 0     Review of Systems Full ROS  was asked and was negative except for the information on the HPI  Physical Exam Blood pressure (!) 155/89, pulse 79, temperature 97.8 F (36.6 C), temperature source  Oral, resp. rate 16, height 5\' 5"  (1.651 m), weight 62 kg, SpO2 100 %. CONSTITUTIONAL: chronically ill obvious distress due to pain. EYES: Pupils are equal, round,, Sclera are non-icteric. EARS, NOSE, MOUTH AND THROAT: The oropharynx is clear. The oral mucosa is pink and moist. Hearing is intact to voice. LYMPH NODES:  Lymph nodes in the neck are normal. RESPIRATORY:  Lungs are clear. There is normal respiratory effort, with equal breath sounds bilaterally, and without pathologic use of accessory muscles. CARDIOVASCULAR: Heart is regular without murmurs, gallops, or rubs. GI: The abdomen is tenderness palpation with rebound tenderness Rovsing sign and obvious peritonitis do not palpate a hernia .  No obvious scars  GU: Rectal deferred.   MUSCULOSKELETAL: Normal muscle strength and tone. No cyanosis or edema.   SKIN: Turgor is good and there are no pathologic skin lesions or ulcers. NEUROLOGIC: Motor and sensation is grossly normal. Cranial nerves are grossly intact. PSYCH:  Oriented to person, place and time. Affect is normal.  Data Reviewed  I have personally reviewed the patient's imaging, laboratory findings and medical records.    Assessment/Plan 82 year old female with an acute abdomen and findings concerning for internal hernia.  She is clearly peritonitic and warrants emergent surgical intervention.  Discussed with the patient and the son in detail about her situation.  The proposed surgery.  The risk the benefits and the possible complications including but not limited to: Bleeding, infection, multiple interventions, cardiovascular complications related to anesthesia and even death.  This is emergency surgery and makes it high risk for perioperative morbidity.  Unfortunately not many options.  She wants to be full code and wishes  aggressive medical therapy. We will place ngt, resuscitated her time and to screen her start antibiotics and proceed emergently to the operating room  Sterling Big, MD FACS General Surgeon 12/07/2022, 10:42 PM

## 2022-12-07 NOTE — Anesthesia Procedure Notes (Signed)
Procedure Name: Intubation Date/Time: 12/07/2022 11:16 PM  Performed by: Jeronimo Norma, CRNAPre-anesthesia Checklist: Patient identified, Emergency Drugs available, Timeout performed, Suction available and Patient being monitored Patient Re-evaluated:Patient Re-evaluated prior to induction Oxygen Delivery Method: Circle system utilized Preoxygenation: Pre-oxygenation with 100% oxygen Induction Type: IV induction Laryngoscope Size: Mac and 3 Grade View: Grade I Tube type: Oral Tube size: 7.0 mm Number of attempts: 1 Placement Confirmation: ETT inserted through vocal cords under direct vision, positive ETCO2 and breath sounds checked- equal and bilateral Secured at: 21 cm Tube secured with: Tape Dental Injury: Teeth and Oropharynx as per pre-operative assessment

## 2022-12-07 NOTE — Anesthesia Preprocedure Evaluation (Addendum)
Anesthesia Evaluation  Patient identified by MRN, date of birth, ID band Patient awake    Reviewed: Allergy & Precautions, NPO status , Patient's Chart, lab work & pertinent test results  History of Anesthesia Complications (+) PONV and history of anesthetic complications  Airway Mallampati: IV   Neck ROM: Full    Dental  (+) Missing   Pulmonary neg pulmonary ROS   Pulmonary exam normal breath sounds clear to auscultation       Cardiovascular hypertension, Normal cardiovascular exam Rhythm:Regular Rate:Normal  ECG 12/07/22: Normal sinus rhythm Rightward axis ST & T wave abnormality, consider lateral ischemia   Neuro/Psych  Headaches PSYCHIATRIC DISORDERS Anxiety     Vertigo     GI/Hepatic negative GI ROS,,,  Endo/Other  diabetes, Type 2    Renal/GU Renal disease (stage III CKD)     Musculoskeletal  (+) Arthritis ,    Abdominal   Peds  Hematology  (+) Blood dyscrasia, anemia   Anesthesia Other Findings   Reproductive/Obstetrics                             Anesthesia Physical Anesthesia Plan  ASA: 2 and emergent  Anesthesia Plan: General   Post-op Pain Management:    Induction: Intravenous and Rapid sequence  PONV Risk Score and Plan: 4 or greater and Ondansetron, Dexamethasone and Treatment may vary due to age or medical condition  Airway Management Planned: Oral ETT  Additional Equipment:   Intra-op Plan:   Post-operative Plan: Extubation in OR  Informed Consent: I have reviewed the patients History and Physical, chart, labs and discussed the procedure including the risks, benefits and alternatives for the proposed anesthesia with the patient or authorized representative who has indicated his/her understanding and acceptance.     Dental advisory given  Plan Discussed with: CRNA  Anesthesia Plan Comments: (Son at bedside for history and consent.  Patient and son  consented for risks of anesthesia including but not limited to:  - adverse reactions to medications - damage to eyes, teeth, lips or other oral mucosa - nerve damage due to positioning  - sore throat or hoarseness - damage to heart, brain, nerves, lungs, other parts of body or loss of life  Informed patient about role of CRNA in peri- and intra-operative care.  Patient voiced understanding.)        Anesthesia Quick Evaluation

## 2022-12-07 NOTE — ED Triage Notes (Signed)
Pt to ED from home via POV with son for abdominal pain that started early this morning. Pt has been vomiting as well. Pt is CAOx4, in no acute distress in wheel chair in triage.  Pt has not lost any of her appetite. Pt ambulates at home with no issues either.

## 2022-12-07 NOTE — ED Provider Notes (Signed)
[facility] Provider Note    Event Date/Time   First MD Initiated Contact with Patient 12/07/22 2038     (approximate)   History   Abdominal Pain (Since early AM)   HPI  Melanie Gray is a 82 y.o. female with a history of hypertension, CKD, hyperlipidemia, depression who presents with abdominal pain, acute onset this morning, diffuse in location, associate with nausea and multiple episodes of vomiting.  The patient states that she had a hard bowel movement earlier today.  She has not had any diarrhea.  She denies any acute urinary symptoms.  She reports a remote history of a hysterectomy but no other abdominal surgeries.  I reviewed the past medical records.  The patient's most recent outpatient encounter was with Dr. Thedore Mins from nephrology on 12/11 of last year for follow-up of her CKD.   Physical Exam   Triage Vital Signs: ED Triage Vitals [12/07/22 2027]  Enc Vitals Group     BP (!) 146/75     Pulse Rate 77     Resp 16     Temp 97.8 F (36.6 C)     Temp Source Oral     SpO2 99 %     Weight 136 lb 11 oz (62 kg)     Height 5\' 5"  (1.651 m)     Head Circumference      Peak Flow      Pain Score 10     Pain Loc      Pain Edu?      Excl. in GC?     Most recent vital signs: Vitals:   12/07/22 2200 12/07/22 2230  BP: (!) 159/78 (!) 155/89  Pulse: 83 79  Resp:    Temp:    SpO2: 100% 100%     General: Alert and oriented, uncomfortable appearing. CV:  Good peripheral perfusion.  Resp:  Normal effort.  Abd:  Soft with mild diffuse tenderness.  No peritoneal signs.  No distention.  Other:  No jaundice or scleral icterus.  Somewhat dry mucous membranes.   ED Results / Procedures / Treatments   Labs (all labs ordered are listed, but only abnormal results are displayed) Labs Reviewed  COMPREHENSIVE METABOLIC PANEL - Abnormal; Notable for the following components:      Result Value   Potassium 3.3 (*)    CO2 21 (*)    Glucose, Bld 209 (*)     Creatinine, Ser 1.06 (*)    GFR, Estimated 52 (*)    All other components within normal limits  CBC - Abnormal; Notable for the following components:   WBC 11.0 (*)    Hemoglobin 11.6 (*)    HCT 35.8 (*)    RDW 17.0 (*)    All other components within normal limits  LACTIC ACID, PLASMA - Abnormal; Notable for the following components:   Lactic Acid, Venous 4.7 (*)    All other components within normal limits  AEROBIC/ANAEROBIC CULTURE W GRAM STAIN (SURGICAL/DEEP WOUND)  LIPASE, BLOOD  URINALYSIS, ROUTINE W REFLEX MICROSCOPIC  LACTIC ACID, PLASMA  TYPE AND SCREEN  SURGICAL PATHOLOGY  TROPONIN I (HIGH SENSITIVITY)  TROPONIN I (HIGH SENSITIVITY)     EKG  ED ECG REPORT I, Dionne Bucy, the attending physician, personally viewed and interpreted this ECG.  Date: 12/07/2022 EKG Time: 2028 Rate: 74 Rhythm: normal sinus rhythm QRS Axis: Right status Intervals: normal ST/T Wave abnormalities: Nonspecific ST abnormalities Narrative Interpretation: Nonspecific abnormalities with no evidence of acute ischemia;  no recent EKG available for comparison    RADIOLOGY  CT abdomen/pelvis: I independently viewed and interpreted the images; multiple dilated bowel loops.  Radiology report indicates closed-loop obstruction.   PROCEDURES:  Critical Care performed: Yes, see critical care procedure note(s)  .Critical Care  Performed by: Dionne Bucy, MD Authorized by: Dionne Bucy, MD   Critical care provider statement:    Critical care time (minutes):  30   Critical care time was exclusive of:  Separately billable procedures and treating other patients   Critical care was necessary to treat or prevent imminent or life-threatening deterioration of the following conditions: Surgical emergency.   Critical care was time spent personally by me on the following activities:  Development of treatment plan with patient or surrogate, discussions with consultants, evaluation of  patient's response to treatment, examination of patient, ordering and review of laboratory studies, ordering and review of radiographic studies, ordering and performing treatments and interventions, pulse oximetry, re-evaluation of patient's condition, review of old charts and obtaining history from patient or surrogate   Care discussed with: admitting provider      MEDICATIONS ORDERED IN ED: Medications  HYDROmorphone (DILAUDID) injection 1 mg ( Intravenous MAR Hold 12/07/22 2309)  metroNIDAZOLE (FLAGYL) IVPB 500 mg (500 mg Intravenous Given 12/08/22 0002)  ondansetron (ZOFRAN) injection 4 mg (4 mg Intravenous Given 12/07/22 2119)  morphine (PF) 4 MG/ML injection 4 mg (4 mg Intravenous Given 12/07/22 2119)  iohexol (OMNIPAQUE) 300 MG/ML solution 80 mL (80 mLs Intravenous Contrast Given 12/07/22 2128)  sodium chloride 0.9 % bolus 1,000 mL (1,000 mLs Intravenous New Bag/Given 12/07/22 2237)  lidocaine (XYLOCAINE) 2 % viscous mouth solution 15 mL (15 mLs Mouth/Throat Given 12/07/22 2233)     IMPRESSION / MDM / ASSESSMENT AND PLAN / ED COURSE  I reviewed the triage vital signs and the nursing notes.  82 year old female with PMH as noted above presents with acute onset of diffuse abdominal pain and vomiting earlier today which has persisted.  On exam she is uncomfortable appearing and is somewhat diffusely tender on exam.  Differential diagnosis includes, but is not limited to, gastroenteritis, diverticulitis, colitis, SBO, volvulus, UTI, pancreatitis, less likely other hepatobiliary etiology.  We will obtain lab workup, CT abdomen/pelvis, give analgesia, and reassess.  Patient's presentation is most consistent with acute presentation with potential threat to life or bodily function.  The patient is on the cardiac monitor to evaluate for evidence of arrhythmia and/or significant heart rate changes.  ----------------------------------------- 10:01 PM on  12/07/2022 -----------------------------------------  Radiology verbal report: Closed-loop bowel obstruction.  I have paged Dr. Everlene Farrier.  ----------------------------------------- 10:57 PM on 12/07/2022 -----------------------------------------  Dr. Everlene Farrier has evaluated the patient and taken her to the OR.   FINAL CLINICAL IMPRESSION(S) / ED DIAGNOSES   Final diagnoses:  Small bowel obstruction (HCC)     Rx / DC Orders   ED Discharge Orders     None        Note:  This document was prepared using Dragon voice recognition software and may include unintentional dictation errors.    Dionne Bucy, MD 12/08/22 [phone]

## 2022-12-08 ENCOUNTER — Encounter: Payer: Self-pay | Admitting: Surgery

## 2022-12-08 ENCOUNTER — Other Ambulatory Visit: Payer: Self-pay

## 2022-12-08 DIAGNOSIS — K460 Unspecified abdominal hernia with obstruction, without gangrene: Secondary | ICD-10-CM

## 2022-12-08 DIAGNOSIS — K56609 Unspecified intestinal obstruction, unspecified as to partial versus complete obstruction: Secondary | ICD-10-CM | POA: Diagnosis not present

## 2022-12-08 DIAGNOSIS — Z9889 Other specified postprocedural states: Secondary | ICD-10-CM

## 2022-12-08 DIAGNOSIS — R001 Bradycardia, unspecified: Secondary | ICD-10-CM

## 2022-12-08 LAB — CBC
HCT: 33.6 % — ABNORMAL LOW (ref 36.0–46.0)
Hemoglobin: 10.6 g/dL — ABNORMAL LOW (ref 12.0–15.0)
MCH: 28.7 pg (ref 26.0–34.0)
MCHC: 31.5 g/dL (ref 30.0–36.0)
MCV: 91.1 fL (ref 80.0–100.0)
Platelets: 207 10*3/uL (ref 150–400)
RBC: 3.69 MIL/uL — ABNORMAL LOW (ref 3.87–5.11)
RDW: 16.9 % — ABNORMAL HIGH (ref 11.5–15.5)
WBC: 9.5 10*3/uL (ref 4.0–10.5)
nRBC: 0 % (ref 0.0–0.2)

## 2022-12-08 LAB — COMPREHENSIVE METABOLIC PANEL
ALT: 19 U/L (ref 0–44)
AST: 35 U/L (ref 15–41)
Albumin: 3.5 g/dL (ref 3.5–5.0)
Alkaline Phosphatase: 58 U/L (ref 38–126)
Anion gap: 10 (ref 5–15)
BUN: 18 mg/dL (ref 8–23)
CO2: 19 mmol/L — ABNORMAL LOW (ref 22–32)
Calcium: 8 mg/dL — ABNORMAL LOW (ref 8.9–10.3)
Chloride: 111 mmol/L (ref 98–111)
Creatinine, Ser: 0.92 mg/dL (ref 0.44–1.00)
GFR, Estimated: >60 mL/min (ref >60)
Glucose, Bld: 143 mg/dL — ABNORMAL HIGH (ref 70–99)
Potassium: 3.8 mmol/L (ref 3.5–5.1)
Sodium: 140 mmol/L (ref 135–145)
Total Bilirubin: 0.8 mg/dL (ref 0.3–1.2)
Total Protein: 6.2 g/dL — ABNORMAL LOW (ref 6.5–8.1)

## 2022-12-08 LAB — PHOSPHORUS: Phosphorus: 3.8 mg/dL (ref 2.5–4.6)

## 2022-12-08 LAB — LACTIC ACID, PLASMA
Lactic Acid, Venous: 2.7 mmol/L (ref 0.5–1.9)
Lactic Acid, Venous: 4.7 mmol/L (ref 0.5–1.9)

## 2022-12-08 LAB — GLUCOSE, CAPILLARY
Glucose-Capillary: 152 mg/dL — ABNORMAL HIGH (ref 70–99)
Glucose-Capillary: 119 mg/dL — ABNORMAL HIGH (ref 70–99)

## 2022-12-08 LAB — MAGNESIUM: Magnesium: 2.2 mg/dL (ref 1.7–2.4)

## 2022-12-08 LAB — TROPONIN I (HIGH SENSITIVITY): Troponin I (High Sensitivity): 22 ng/L — ABNORMAL HIGH (ref <18)

## 2022-12-08 MED ORDER — EPHEDRINE SULFATE (PRESSORS) 50 MG/ML IJ SOLN
INTRAMUSCULAR | Status: AC
  Filled 2022-12-08: qty 1

## 2022-12-08 MED ORDER — POTASSIUM CHLORIDE IN NACL 20-0.9 MEQ/L-% IV SOLN
INTRAVENOUS | Status: DC
  Filled 2022-12-08 (×3): qty 1000

## 2022-12-08 MED ORDER — HYDRALAZINE HCL 20 MG/ML IJ SOLN: 10.0000 mg | INTRAMUSCULAR | Status: DC | PRN

## 2022-12-08 MED ORDER — ALBUMIN HUMAN 5 % IV SOLN
INTRAVENOUS | Status: AC
  Filled 2022-12-08: qty 250

## 2022-12-08 MED ORDER — MORPHINE SULFATE (PF) 2 MG/ML IV SOLN
2.0000 mg | INTRAVENOUS | Status: DC | PRN
  Administered 2022-12-09 – 2022-12-12 (×2): 2 mg via INTRAVENOUS
  Filled 2022-12-08 (×3): qty 1

## 2022-12-08 MED ORDER — ONDANSETRON HCL 4 MG/2ML IJ SOLN
4.0000 mg | Freq: Four times a day (QID) | INTRAMUSCULAR | Status: DC | PRN
  Filled 2022-12-08: qty 2

## 2022-12-08 MED ORDER — SODIUM CHLORIDE 0.9 % IV SOLN
INTRAVENOUS | Status: AC
  Filled 2022-12-08 (×4): qty 1000

## 2022-12-08 MED ORDER — ONDANSETRON HCL 4 MG/2ML IJ SOLN: 4.0000 mg | Freq: Once | INTRAMUSCULAR | Status: DC | PRN

## 2022-12-08 MED ORDER — SUGAMMADEX SODIUM 200 MG/2ML IV SOLN
INTRAVENOUS | Status: DC | PRN
  Administered 2022-12-08: 200 mg via INTRAVENOUS

## 2022-12-08 MED ORDER — SODIUM CHLORIDE 0.9 % IV SOLN
INTRAVENOUS | Status: DC | PRN
  Administered 2022-12-08: 70 mL

## 2022-12-08 MED ORDER — ALBUMIN HUMAN 5 % IV SOLN
12.5000 g | Freq: Once | INTRAVENOUS | Status: AC
  Administered 2022-12-08: 12.5 g via INTRAVENOUS

## 2022-12-08 MED ORDER — SODIUM CHLORIDE 0.9 % IV SOLN
2.0000 g | Freq: Two times a day (BID) | INTRAVENOUS | Status: AC
  Administered 2022-12-08 (×2): 2 g via INTRAVENOUS
  Filled 2022-12-08 (×2): qty 2

## 2022-12-08 MED ORDER — OXYCODONE HCL 5 MG/5ML PO SOLN: 5.0000 mg | Freq: Once | ORAL | Status: DC | PRN

## 2022-12-08 MED ORDER — DEXMEDETOMIDINE HCL IN NACL 200 MCG/50ML IV SOLN
INTRAVENOUS | Status: DC | PRN
  Administered 2022-12-07: .4 ug/kg/h via INTRAVENOUS

## 2022-12-08 MED ORDER — FENTANYL CITRATE (PF) 100 MCG/2ML IJ SOLN: 25.0000 ug | INTRAMUSCULAR | Status: DC | PRN

## 2022-12-08 MED ORDER — ONDANSETRON HCL 4 MG/2ML IJ SOLN
INTRAMUSCULAR | Status: DC | PRN
  Administered 2022-12-08: 4 mg via INTRAVENOUS

## 2022-12-08 MED ORDER — CHLORHEXIDINE GLUCONATE CLOTH 2 % EX PADS
6.0000 | MEDICATED_PAD | Freq: Every day | CUTANEOUS | Status: DC
  Administered 2022-12-08 – 2022-12-17 (×10): 6 via TOPICAL

## 2022-12-08 MED ORDER — KETOROLAC TROMETHAMINE 15 MG/ML IJ SOLN
15.0000 mg | Freq: Four times a day (QID) | INTRAMUSCULAR | Status: DC | PRN
  Administered 2022-12-08: 15 mg via INTRAVENOUS
  Filled 2022-12-08: qty 1

## 2022-12-08 MED ORDER — POTASSIUM CHLORIDE 10 MEQ/100ML IV SOLN: 10.0000 meq | INTRAVENOUS | Status: DC

## 2022-12-08 MED ORDER — MORPHINE SULFATE (PF) 2 MG/ML IV SOLN
2.0000 mg | INTRAVENOUS | Status: DC | PRN
  Administered 2022-12-09 – 2022-12-15 (×3): 2 mg via INTRAVENOUS
  Filled 2022-12-08 (×3): qty 1

## 2022-12-08 MED ORDER — ACETAMINOPHEN 10 MG/ML IV SOLN: 1000.0000 mg | Freq: Once | INTRAVENOUS | Status: DC | PRN

## 2022-12-08 MED ORDER — OXYCODONE HCL 5 MG PO TABS: 5.0000 mg | ORAL_TABLET | Freq: Once | ORAL | Status: DC | PRN

## 2022-12-08 MED ORDER — PANTOPRAZOLE SODIUM 40 MG IV SOLR
40.0000 mg | Freq: Two times a day (BID) | INTRAVENOUS | Status: DC
  Administered 2022-12-08 – 2022-12-15 (×16): 40 mg via INTRAVENOUS
  Filled 2022-12-08 (×16): qty 10

## 2022-12-08 MED ORDER — DIPHENHYDRAMINE HCL 50 MG/ML IJ SOLN: 12.5000 mg | Freq: Four times a day (QID) | INTRAMUSCULAR | Status: DC | PRN

## 2022-12-08 MED ORDER — STERILE WATER FOR INJECTION IJ SOLN
INTRAMUSCULAR | Status: AC
  Administered 2022-12-08: 10 mL
  Filled 2022-12-08: qty 10

## 2022-12-08 MED ORDER — GLYCOPYRROLATE 0.2 MG/ML IJ SOLN
INTRAMUSCULAR | Status: AC
  Filled 2022-12-08: qty 1

## 2022-12-08 MED ORDER — GLYCOPYRROLATE 0.2 MG/ML IJ SOLN: 0.1000 mg | Freq: Once | INTRAMUSCULAR | Status: DC

## 2022-12-08 MED ORDER — 0.9 % SODIUM CHLORIDE (POUR BTL) OPTIME
TOPICAL | Status: DC | PRN
  Administered 2022-12-08: 3000 mL

## 2022-12-08 MED ORDER — METHOCARBAMOL 1000 MG/10ML IJ SOLN: 500.0000 mg | Freq: Three times a day (TID) | INTRAVENOUS | Status: DC | PRN

## 2022-12-08 MED ORDER — HEPARIN SODIUM (PORCINE) 5000 UNIT/ML IJ SOLN
5000.0000 [IU] | Freq: Three times a day (TID) | INTRAMUSCULAR | Status: DC
  Administered 2022-12-08 – 2022-12-09 (×4): 5000 [IU] via SUBCUTANEOUS
  Filled 2022-12-08 (×4): qty 1

## 2022-12-08 MED ORDER — EPHEDRINE SULFATE (PRESSORS) 50 MG/ML IJ SOLN
10.0000 mg | Freq: Once | INTRAMUSCULAR | Status: AC
  Administered 2022-12-08: 10 mg via INTRAVENOUS

## 2022-12-08 MED ORDER — METHOCARBAMOL 500 MG PO TABS
500.0000 mg | ORAL_TABLET | Freq: Three times a day (TID) | ORAL | Status: DC | PRN
  Administered 2022-12-12: 500 mg via ORAL
  Filled 2022-12-08: qty 1

## 2022-12-08 MED ORDER — ACETAMINOPHEN 10 MG/ML IV SOLN
1000.0000 mg | Freq: Four times a day (QID) | INTRAVENOUS | Status: AC
  Administered 2022-12-08 (×4): 1000 mg via INTRAVENOUS
  Filled 2022-12-08 (×4): qty 100

## 2022-12-08 MED ORDER — HYDRALAZINE HCL 20 MG/ML IJ SOLN: 5.0000 mg | Freq: Four times a day (QID) | INTRAMUSCULAR | Status: DC | PRN

## 2022-12-08 MED ORDER — ALBUMIN HUMAN 25 % IV SOLN
25.0000 g | Freq: Once | INTRAVENOUS | Status: DC
  Filled 2022-12-08: qty 100

## 2022-12-08 MED ORDER — LACTATED RINGERS IV SOLN: INTRAVENOUS | Status: DC

## 2022-12-08 MED ORDER — BUPIVACAINE-EPINEPHRINE (PF) 0.25% -1:200000 IJ SOLN
INTRAMUSCULAR | Status: DC | PRN
  Administered 2022-12-08: 30 mL via PERINEURAL

## 2022-12-08 MED ORDER — ONDANSETRON HCL 4 MG PO TABS: 4.0000 mg | ORAL_TABLET | Freq: Four times a day (QID) | ORAL | Status: DC | PRN

## 2022-12-08 MED ORDER — ONDANSETRON 4 MG PO TBDP: 4.0000 mg | ORAL_TABLET | Freq: Four times a day (QID) | ORAL | Status: DC | PRN

## 2022-12-08 MED ORDER — DIPHENHYDRAMINE HCL 12.5 MG/5ML PO ELIX: 12.5000 mg | ORAL_SOLUTION | Freq: Four times a day (QID) | ORAL | Status: DC | PRN

## 2022-12-08 MED ORDER — ONDANSETRON HCL 4 MG/2ML IJ SOLN
4.0000 mg | Freq: Four times a day (QID) | INTRAMUSCULAR | Status: DC | PRN
  Administered 2022-12-09 (×2): 4 mg via INTRAVENOUS
  Filled 2022-12-08: qty 2

## 2022-12-08 NOTE — Assessment & Plan Note (Signed)
Hydralazine IV as needed n.p.o.

## 2022-12-08 NOTE — Transfer of Care (Signed)
Immediate Anesthesia Transfer of Care Note  Patient: Melanie Gray  Procedure(s) Performed: EXPLORATORY LAPAROTOMY SMALL BOWEL RESECTION  Patient Location: PACU  Anesthesia Type:General  Level of Consciousness: drowsy  Airway & Oxygen Therapy: Patient Spontanous Breathing and Patient connected to nasal cannula oxygen  Post-op Assessment: Report given to RN  Post vital signs: Reviewed and stable  Last Vitals:  Vitals Value Taken Time  BP 87/53 12/08/22 0108  Temp    Pulse 59 12/08/22 0114  Resp 14 12/08/22 0114  SpO2 100 % 12/08/22 0114  Vitals shown include unvalidated device data.  Last Pain:  Vitals:   12/07/22 2027  TempSrc: Oral  PainSc: 10-Worst pain ever         Complications: No notable events documented.

## 2022-12-08 NOTE — Assessment & Plan Note (Signed)
Patient had sinus bradycardia postoperatively mid 40s to 50s, improving to the 60s when awakened Avoid AV nodal blockers Continuous cardiac monitoring

## 2022-12-08 NOTE — Assessment & Plan Note (Signed)
Hemoglobin is at baseline

## 2022-12-08 NOTE — Assessment & Plan Note (Signed)
Will hold off on home statins while n.p.o.

## 2022-12-08 NOTE — Consult Note (Addendum)
Initial Consultation Note   Patient: Melanie Gray WJX:[id number] DOB: October 25, 1940 PCP: Melanie Cory, MD DOA: 12/07/2022 DOS: the patient was seen and examined on 12/08/2022 Primary service: Melanie Ro, MD  Referring physician: Dr Melanie Gray Reason for consult: Medical management  Assessment/Plan: Assessment and Plan: * Small bowel obstruction [facility]) S/p laparotomy 12/08/2022 Patient currently with NG tube Continued management per surgery  Sinus bradycardia Patient had sinus bradycardia postoperatively mid 40s to 50s, improving to the 60s when awakened Avoid AV nodal blockers Continuous cardiac monitoring  Dyslipidemia Will hold off on home statins while n.p.o.  Diabetes mellitus with renal manifestation (HCC) Sliding scale coverage while n.p.o.  Chronic kidney disease, stage 3a (HCC) Renal function at baseline  Chronic anemia Hemoglobin is at baseline  Benign hypertension Hydralazine IV as needed n.p.o.       [facility] will continue to follow the patient.  HPI: Melanie Gray is a 82 y.o. female with past medical history of Medical consultation for medical management on 82 year old female who is s/p emergent laparotomy for small bowel obstruction. Past medical history significant for DM, HTN, CKD 3A, HLD, depression, urge incontinence and osteoarthritis. Patient underwent partial small bowel resection and was patient was transferred to PACU in stable condition.  Operative report still pending. At the time of my evaluation, patient is resting comfortably in PACU.  She is still drowsy from the effects of anesthesia and will awake to gentle shaking but will readily fall back asleep. Vitals on monitor shows sinus bradycardia in the 50s, SBP around 100, compared to the 150s on arrival. Preoperative labs were significant for WBC 11,000 with lactic acid 4.7.  Lipase and LFTs were normal.  Creatinine at baseline at 1.06.  Hemoglobin at baseline at 11.0.  Troponin of 10. EKG, personally  viewed and interpreted showed NSR at 74 with nonspecific ST-T wave changes.   Past Medical History:  Diagnosis Date   Chronic kidney disease    RENAL INSUFF   Complication of anesthesia    HA , N/V   Diabetes mellitus without complication (HCC)    Hyperlipidemia    Hypertension    Knee instability    RIGHT KNEE WEAKNESS   PONV (postoperative nausea and vomiting)    Vertigo    Past Surgical History:  Procedure Laterality Date   ABDOMINAL HYSTERECTOMY     CATARACT EXTRACTION W/PHACO Left 07/16/2017   Procedure: CATARACT EXTRACTION PHACO AND INTRAOCULAR LENS PLACEMENT (IOC);  Surgeon: Melanie Crane, MD;  Location: [facility] ORS;  Service: Ophthalmology;  Laterality: Left;  [place] 01:27.0 AP% 21.0 CDE 18.28 Fluid Pack Lot # [id number]   CHOLECYSTECTOMY     HERNIA REPAIR     Social History:  reports that she has never smoked. She has never used smokeless tobacco. She reports that she does not drink alcohol and does not use drugs.  Allergies  Allergen Reactions   Sulfa Antibiotics Rash    Family History  Problem Relation Age of Onset   Cancer Mother    Diabetes Father    Cancer Sister    Breast cancer Neg Hx     Prior to Admission medications   Medication Sig Start Date End Date Taking? Authorizing Provider  acetaminophen (TYLENOL) 500 MG tablet Take 1,000 mg by mouth daily as needed for moderate pain or headache.    [provider]  amLODipine (NORVASC) 2.5 MG tablet Take 1 tablet (2.5 mg total) by mouth daily. 08/29/22   Melanie Cory, MD  aspirin EC 81 MG tablet Take  81 mg by mouth daily. 01/06/11   [provider]  atorvastatin (LIPITOR) 40 MG tablet Take 1 tablet (40 mg total) by mouth daily. 08/29/22   Melanie Cory, MD  cholecalciferol (VITAMIN D3) 25 MCG (1000 UNIT) tablet Take 1,000 Units by mouth 2 (two) times daily.    [provider]  diclofenac Sodium (VOLTAREN) 1 % GEL Apply 4 g topically 4 (four) times daily. 01/23/21   Melanie Cory, MD   Glucosamine-Chondroitin (OSTEO BI-FLEX REGULAR STRENGTH PO) Take 1 tablet by mouth daily.    [provider]  Multiple Vitamin (MULTI-VITAMINS) TABS Take 1 tablet by mouth every evening.     [provider]  Polyethyl Glycol-Propyl Glycol (SYSTANE OP) Apply 1 drop to eye daily as needed (dry eyes).    [provider]  telmisartan (MICARDIS) 40 MG tablet Take 1 tablet (40 mg total) by mouth daily. 08/29/22   Melanie Cory, MD  tolterodine (DETROL LA) 2 MG 24 hr capsule Take 1-2 capsules (2-4 mg total) by mouth daily. 08/29/22   Melanie Cory, MD    Physical Exam: Vitals:   12/07/22 2200 12/07/22 2230 12/08/22 0109 12/08/22 0145  BP: (!) 159/78 (!) 155/89  104/63  Pulse: 83 79  63  Resp:    14  Temp:   97.6 F (36.4 C)   TempSrc:      SpO2: 100% 100%  100%  Weight:      Height:       Physical Exam Vitals and nursing note reviewed.  Constitutional:      General: She is sleeping. She is not in acute distress.    Comments: Patient in PACU still very somnolent from anesthesia, appears comfortable. NG draining dark greenish-yellow  HENT:     Head: Normocephalic and atraumatic.  Cardiovascular:     Rate and Rhythm: Regular rhythm. Bradycardia present.     Heart sounds: Normal heart sounds.  Pulmonary:     Effort: Pulmonary effort is normal.     Breath sounds: Normal breath sounds.  Abdominal:     Tenderness: There is no abdominal tenderness.  Neurological:     General: No focal deficit present.     Data Reviewed: Relevant notes from primary care and specialist visits, past discharge summaries as available in EHR, including Care Everywhere. Prior diagnostic testing as pertinent to current admission diagnoses Updated medications and problem lists for reconciliation ED course, including vitals, labs, imaging, treatment and response to treatment Triage notes, nursing and pharmacy notes and ED provider's notes Notable results as noted in  HPI   Family Communication: none Primary team communication: Dr Melanie Gray Thank you very much for involving [place] in the care of your patient.  Author: Andris Baumann, MD 12/08/2022 2:01 AM  For on call review [email or web address].

## 2022-12-08 NOTE — Anesthesia Postprocedure Evaluation (Signed)
Anesthesia Post Note  Patient: Melanie Gray  Procedure(s) Performed: EXPLORATORY LAPAROTOMY SMALL BOWEL RESECTION  Patient location during evaluation: PACU Anesthesia Type: General Level of consciousness: awake and alert, oriented and patient cooperative Pain management: pain level controlled Vital Signs Assessment: post-procedure vital signs reviewed and stable Respiratory status: spontaneous breathing, nonlabored ventilation and respiratory function stable Cardiovascular status: blood pressure returned to baseline and stable Postop Assessment: adequate PO intake Anesthetic complications: no   No notable events documented.   Last Vitals:  Vitals:   12/08/22 0223 12/08/22 0242  BP:  137/63  Pulse: 61 (!) 51  Resp: 14 16  Temp:  (!) 36.3 C  SpO2: 100% 100%    Last Pain:  Vitals:   12/08/22 0242  TempSrc: Axillary  PainSc: 0-No pain                 Reed Breech

## 2022-12-08 NOTE — Op Note (Addendum)
PROCEDURES: Exploratory laparotomy  Reduction of internal hernia Small bowel resection 120cms with primary side to side functional end to end anastomosis  Pre-operative Diagnosis: internal henria  Post-operative Diagnosis: same  Surgeon: Merri Ray Julieana Eshleman    Anesthesia: General endotracheal anesthesia  ASA Class: 3   Surgeon: Sterling Big , MD FACS  Anesthesia: Gen. with endotracheal tube   Findings: 120 cm segment with late ischemic changes caused by internal hernia Seropurulent ascites Dense adhesions from the omentum to abd wall and from T. Colon to abd wall, no bowel injuries observed Widely patent anastomosis with good perfusion and no intra-op leak Modifier 22 applied due to the complexity of the case and also with extensive adhesions.   Estimated Blood Loss: 20cc         Specimens: small bowel          Complications: none                Procedure Details  The patient was seen again in the Holding Room. The benefits, complications, treatment options, and expected outcomes were discussed with the patient. The risks of bleeding, infection, recurrence of symptoms, failure to resolve symptoms,  bowel injury, any of which could require further surgery were reviewed with the patient.   The patient was taken to Operating Room, identified as Melanie Gray and the procedure verified.  A Time Out was held and the above information confirmed.  Prior to the induction of general anesthesia, antibiotic prophylaxis was administered. VTE prophylaxis was in place. General endotracheal anesthesia was then administered and tolerated well. After the induction, the abdomen was prepped with Chloraprep and draped in the sterile fashion. The patient was positioned in the supine position.  Serous midline laparotomy was performed and electrocautery used to dissect through subcutaneous tissue.  We entered the upper portion of the abdominal wall what it was virgin [place].  We elevated the fascia and  the abdominal cavity was entered under direct visualization.  No evidence of injuries observed.  Dense adhesions of the abdominal wall to the omentum as well as from this transverse colon to the abdominal wall were encountered.  Please note that we spent at least 40 minutes of total operative time lysing adhesions.  After restoring anatomy it was evident that she had a long segment of bowel necrosis.  This was caused by an internal hernia and adhesions.  The adhesion and internal hernia was guided with scissors liberating the twisted segment of the small bowel.  After a few minutes there was still 120 cm segment that was necrotic.  Proximal and distal mesenteric window was created and the both proximal distal bowel was divided with a 75 GIA stapler.  We felt that the margins of excisions were viable and created a side-to-side functional end-to-end stapled anastomosis.  The common channel was closed with multiple interrupted 2-0 Vicryl.  There was great circulation within the mesentery.  There was no evidence of leak.  Mesenteric defect was closed with a 3-0 Vicryl in a running fashion.  Abdominal cavity was irrigated with warm saline.  No evidence of any injuries observed. Liposomal Marcaine was used to infiltrate the abdominal wall using a tap block bilaterally.  Seprafilm was also placed to avoid further adhesions.  The midline fascia was closed with a running 0 PDS suture using the small bite technique. Skin was loosely approximated with staples and a sterile dressing applied. Needle and laparotomy count were correct and there were no immediate complications Please note that I  decided to perform an anastomosis and closed the abdominal cavity due to the patient's stable physiology and appropriate hemodynamics.  Sterling Big, MD, FACS

## 2022-12-08 NOTE — Progress Notes (Signed)
Progress Note   Patient: Melanie Gray ZOX:[id number] DOB: [birth year] DOA: 12/07/2022     1 DOS: the patient was seen and examined on 12/08/2022     Subjective:  Patient seen in his bed at bedside this morning in the presence of the son at bedside She admits to some pain at the surgical incision sites however improving She also have some headache that improved with Tylenol administration Denies nausea vomiting chest pain cough or urinary complaints NG tube still in place   Brief hospital course:  From HPI "Melanie Gray is a 82 y.o. female with past medical history of Medical consultation for medical management on 82 year old female who is s/p emergent laparotomy for small bowel obstruction. Past medical history significant for DM, HTN, CKD 3A, HLD, depression, urge incontinence and osteoarthritis. Patient underwent partial small bowel resection and was patient was transferred to PACU in stable condition.  Operative report still pending. At the time of my evaluation, patient is resting comfortably in PACU.  She is still drowsy from the effects of anesthesia and will awake to gentle shaking but will readily fall back asleep. Vitals on monitor shows sinus bradycardia in the 50s, SBP around 100, compared to the 150s on arrival. Preoperative labs were significant for WBC 11,000 with lactic acid 4.7.  Lipase and LFTs were normal.  Creatinine at baseline at 1.06.  Hemoglobin at baseline at 11.0.  Troponin of 10. EKG, personally viewed and interpreted showed NSR at 74 with nonspecific ST-T wave changes. "  Assessment and Plan: * Small bowel obstruction (HCC) S/p laparotomy 12/08/2022 Patient currently with NG tube Continued management per surgery   Sinus bradycardia Patient had sinus bradycardia postoperatively mid 40s to 50s, improving to the 60s when awakened Avoid AV nodal blockers Continuous cardiac monitoring   Dyslipidemia Will hold off on home statins while n.p.o.   Diabetes mellitus  with renal manifestation (HCC) Sliding scale coverage while n.p.o.   Chronic kidney disease, stage 3a (HCC) Renal function at baseline   Chronic anemia Hemoglobin is at baseline   Benign hypertension Hydralazine IV as needed n.p.o.    Physical Exam: Constitutional:      General: She is sleeping. She is not in acute distress.    Comments: NG draining dark greenish-yellow  HENT:     Head: Normocephalic and atraumatic.  Cardiovascular:     Rate and Rhythm: Regular rhythm. Bradycardia present.     Heart sounds: Normal heart sounds.  Pulmonary:     Effort: Pulmonary effort is normal.     Breath sounds: Normal breath sounds.  Abdominal:     Tenderness: There is no abdominal tenderness.  Neurological:     General: No focal deficit present.      Data Reviewed I have personally reviewed patient's laboratory results today showing sodium 140, potassium 3.8 glucose 143 creatinine 0.92 WBC 9.5  Family Communication: Discussed with patient's son present at bedside  Time spent: 55 minutes  Vitals:   12/08/22 0242 12/08/22 0316 12/08/22 0419 12/08/22 0818  BP: 137/63 138/61 134/65 138/70  Pulse: (!) 51 (!) 46 (!) 52 74  Resp: 16 16 16 18   Temp: (!) 97.4 F (36.3 C) (!) 97.2 F (36.2 C) (!) 97.1 F (36.2 C) 98 F (36.7 C)  TempSrc: Axillary Axillary Axillary   SpO2: 100% 100% 100% 100%  Weight:      Height:        Author: Loyce Dys, MD 12/08/2022 3:34 PM  For on call review [email or web address].

## 2022-12-08 NOTE — Assessment & Plan Note (Signed)
Sliding scale coverage while n.p.o.

## 2022-12-08 NOTE — Assessment & Plan Note (Addendum)
S/p laparotomy 12/08/2022 Patient currently with NG tube Continued management per surgery

## 2022-12-08 NOTE — Progress Notes (Signed)
12/08/2022  Subjective: Patient is 1 Day Post-Op s/p exlap and small bowel resection for internal hernia with small bowel ischemia.  No acute events overnight except an episode of bradycardia post-op, which has resolved.  Reports abdominal soreness.  WBC improved to 9.5, Cr down to 0.92, Hgb at 10.6, likely dilutional.  Vital signs: Temp:  [97.1 F (36.2 C)-98 F (36.7 C)] 98 F (36.7 C) (05/27 0818) Pulse Rate:  [46-83] 74 (05/27 0818) Resp:  [12-18] 18 (05/27 0818) BP: (104-163)/(56-89) 138/70 (05/27 0818) SpO2:  [99 %-100 %] 100 % (05/27 0818) Weight:  [62 kg] 62 kg (05/26 2027)   Intake/Output: 05/26 0701 - 05/27 0700 In: 1195 [I.V.:995; IV Piggyback:200] Out: 550 [Urine:500; Blood:50] Last BM Date : 12/07/22  Physical Exam: Constitutional: No acute distress Abdomen:  soft, non-distended, appropriately tender to palpation.  Incision clean, with old serosanguinous stain on dressing.  No active bleeding.  NG in place with bilious content in cannister.  Labs:  Recent Labs    12/07/22 2030 12/08/22 0501  WBC 11.0* 9.5  HGB 11.6* 10.6*  HCT 35.8* 33.6*  PLT 253 207   Recent Labs    12/07/22 2030 12/08/22 0501  NA 141 140  K 3.3* 3.8  CL 105 111  CO2 21* 19*  GLUCOSE 209* 143*  BUN 22 18  CREATININE 1.06* 0.92  CALCIUM 9.6 8.0*   No results for input(s): "LABPROT", "INR" in the last 72 hours.  Imaging: CT ABDOMEN PELVIS W CONTRAST  Result Date: 12/07/2022 CLINICAL DATA:  Abdominal pain, acute, nonlocalized. Abdominal pain that started early this morning. Pt has been vomiting as well. Pt is CAOx4, in no acute distress in wheel chair in triage. EXAM: CT ABDOMEN AND PELVIS WITH CONTRAST TECHNIQUE: Multidetector CT imaging of the abdomen and pelvis was performed using the standard protocol following bolus administration of intravenous contrast. RADIATION DOSE REDUCTION: This exam was performed according to the departmental dose-optimization program which includes  automated exposure control, adjustment of the mA and/or kV according to patient size and/or use of iterative reconstruction technique. CONTRAST:  80mL OMNIPAQUE IOHEXOL 300 MG/ML  SOLN COMPARISON:  None Available. FINDINGS: Lower chest: Hepatobiliary: Multiple indeterminate hypodense lesions within the liver that may represent hepatic cysts and possible hepatic hemangioma. No gallstones, gallbladder wall thickening, or pericholecystic fluid. No biliary dilatation. Pancreas: No focal lesion. Normal pancreatic contour. No surrounding inflammatory changes. No main pancreatic ductal dilatation. Spleen: Normal in size without focal abnormality. Adrenals/Urinary Tract: No adrenal nodule bilaterally. Bilateral kidneys enhance symmetrically. No hydronephrosis. No hydroureter. The urinary bladder is unremarkable. Stomach/Bowel: Stomach is within normal limits. Multiple loops of small bowel within the lower abdomen and pelvis are dilated with fluid measuring up to 2.9 cm in caliber. Associated mesenteric edema and tethering/swirling with likely closed loop obstruction. No pneumatosis. No bowel wall thickening. The small bowel distally is fully decompressed. No evidence of large bowel wall thickening or dilatation. Appendix appears normal. Vascular/Lymphatic: No abdominal aorta or iliac aneurysm. Mild atherosclerotic plaque of the aorta and its branches. No abdominal, pelvic, or inguinal lymphadenopathy. Reproductive: Status post hysterectomy. No adnexal masses. Other: Trace volume free fluid within the pelvis. No intraperitoneal free gas. No organized fluid collection. Musculoskeletal: No abdominal wall hernia or abnormality. No suspicious lytic or blastic osseous lesions. No acute displaced fracture. Multilevel degenerative changes of the spine. IMPRESSION: 1. Small bowel closed loop obstruction. Underlying internal hernia cannot be excluded. Mesenteric edema with no pneumatosis; however, ischemia not excluded. 2.  Aortic  Atherosclerosis (  ICD10-I70.0). These results were called by telephone at the time of interpretation on 12/07/2022 at 9:57 pm to provider [facility] , who verbally acknowledged these results. Electronically Signed   By: Tish Frederickson M.D.   On: 12/07/2022 22:00    Assessment/Plan: This is a 82 y.o. female s/p exlap and small bowel resection.  --Discussed with the patient again surgical findings and the amount of bowel resected.  For now we'll be awaiting return of bowel function and keep her NPO with NG tube in place.  Discussed with them the possibility of needing IV TPN depending on her bowel function.  The patient's son reports that she's lost about 30 lbs recently.  Her admission albumin was 4.9 and total protein 8.0. --Keep Foley catheter today, likely to d/c tomorrow. --OOB as tolerated.  Will place PT consult to start eval tomorrow.   Howie Ill, MD  [facility]

## 2022-12-08 NOTE — Assessment & Plan Note (Signed)
Renal function at baseline 

## 2022-12-09 ENCOUNTER — Other Ambulatory Visit: Payer: Self-pay

## 2022-12-09 DIAGNOSIS — K56609 Unspecified intestinal obstruction, unspecified as to partial versus complete obstruction: Secondary | ICD-10-CM | POA: Diagnosis not present

## 2022-12-09 LAB — CBC
HCT: 22.1 % — ABNORMAL LOW (ref 36.0–46.0)
Hemoglobin: 7.3 g/dL — ABNORMAL LOW (ref 12.0–15.0)
MCH: 29.2 pg (ref 26.0–34.0)
MCHC: 33 g/dL (ref 30.0–36.0)
MCV: 88.4 fL (ref 80.0–100.0)
Platelets: 169 10*3/uL (ref 150–400)
RBC: 2.5 MIL/uL — ABNORMAL LOW (ref 3.87–5.11)
RDW: 17.2 % — ABNORMAL HIGH (ref 11.5–15.5)
WBC: 10.4 10*3/uL (ref 4.0–10.5)
nRBC: 0 % (ref 0.0–0.2)

## 2022-12-09 LAB — BASIC METABOLIC PANEL
Anion gap: 6 (ref 5–15)
BUN: 26 mg/dL — ABNORMAL HIGH (ref 8–23)
CO2: 22 mmol/L (ref 22–32)
Calcium: 7.7 mg/dL — ABNORMAL LOW (ref 8.9–10.3)
Chloride: 116 mmol/L — ABNORMAL HIGH (ref 98–111)
Creatinine, Ser: 1.1 mg/dL — ABNORMAL HIGH (ref 0.44–1.00)
GFR, Estimated: 50 mL/min — ABNORMAL LOW (ref >60)
Glucose, Bld: 116 mg/dL — ABNORMAL HIGH (ref 70–99)
Potassium: 3.9 mmol/L (ref 3.5–5.1)
Sodium: 144 mmol/L (ref 135–145)

## 2022-12-09 LAB — MAGNESIUM: Magnesium: 2.1 mg/dL (ref 1.7–2.4)

## 2022-12-09 LAB — GLUCOSE, CAPILLARY: Glucose-Capillary: 206 mg/dL — ABNORMAL HIGH (ref 70–99)

## 2022-12-09 MED ORDER — POTASSIUM CHLORIDE IN NACL 20-0.9 MEQ/L-% IV SOLN: INTRAVENOUS | Status: DC

## 2022-12-09 MED ORDER — SODIUM CHLORIDE 0.9% FLUSH
10.0000 mL | Freq: Two times a day (BID) | INTRAVENOUS | Status: DC
  Administered 2022-12-09 – 2022-12-17 (×16): 10 mL

## 2022-12-09 MED ORDER — INSULIN ASPART 100 UNIT/ML IJ SOLN
0.0000 [IU] | Freq: Four times a day (QID) | INTRAMUSCULAR | Status: DC
  Administered 2022-12-09: 3 [IU] via SUBCUTANEOUS
  Administered 2022-12-10: 1 [IU] via SUBCUTANEOUS
  Administered 2022-12-10 – 2022-12-11 (×3): 2 [IU] via SUBCUTANEOUS
  Administered 2022-12-11: 1 [IU] via SUBCUTANEOUS
  Administered 2022-12-11 (×2): 2 [IU] via SUBCUTANEOUS
  Administered 2022-12-11 – 2022-12-13 (×5): 1 [IU] via SUBCUTANEOUS
  Administered 2022-12-13: 2 [IU] via SUBCUTANEOUS
  Administered 2022-12-13 – 2022-12-15 (×6): 1 [IU] via SUBCUTANEOUS
  Filled 2022-12-09 (×20): qty 1

## 2022-12-09 MED ORDER — POTASSIUM CHLORIDE 2 MEQ/ML IV SOLN: INTRAVENOUS | Status: DC

## 2022-12-09 MED ORDER — TRACE MINERALS CU-MN-SE-ZN 300-55-60-3000 MCG/ML IV SOLN
INTRAVENOUS | Status: AC
  Filled 2022-12-09: qty 566.4

## 2022-12-09 MED ORDER — SODIUM CHLORIDE 0.9% FLUSH: 10.0000 mL | INTRAVENOUS | Status: DC | PRN

## 2022-12-09 NOTE — Progress Notes (Signed)
Peripherally Inserted Central Catheter Placement  The IV Nurse has discussed with the patient and/or persons authorized to consent for the patient, the purpose of this procedure and the potential benefits and risks involved with this procedure.  The benefits include less needle sticks, lab draws from the catheter, and the patient may be discharged home with the catheter. Risks include, but not limited to, infection, bleeding, blood clot (thrombus formation), and puncture of an artery; nerve damage and irregular heartbeat and possibility to perform a PICC exchange if needed/ordered by physician.  Alternatives to this procedure were also discussed.  Bard Power PICC patient education guide, fact sheet on infection prevention and patient information card has been provided to patient /or left at bedside.    PICC Placement Documentation  PICC Double Lumen 12/09/22 Right Brachial 34 cm 0 cm (Active)  Indication for Insertion or Continuance of Line Administration of hyperosmolar/irritating solutions (i.e. TPN, Vancomycin, etc.) 12/09/22 1226  Exposed Catheter (cm) 0 cm 12/09/22 1226  Site Assessment Clean, Dry, Intact 12/09/22 1226  Lumen #1 Status Blood return noted;Flushed;Saline locked 12/09/22 1226  Lumen #2 Status Flushed;Blood return noted;Saline locked 12/09/22 1226  Dressing Type Transparent 12/09/22 1226  Dressing Status Antimicrobial disc in place 12/09/22 1226  Safety Lock Not Applicable 12/09/22 1226  Line Care Connections checked and tightened 12/09/22 1226  Line Adjustment (NICU/IV Team Only) No 12/09/22 1226  Dressing Intervention New dressing 12/09/22 1226  Dressing Change Due 12/15/22 12/09/22 1226       Zareen Jamison, [place] 12/09/2022, 12:29 PM

## 2022-12-09 NOTE — TOC CM/SW Note (Signed)
TOC following for therapy recommendations.  Charlynn Court, CSW [phone]

## 2022-12-09 NOTE — Progress Notes (Addendum)
Initial Nutrition Assessment  DOCUMENTATION CODES:   Non-severe (moderate) malnutrition in context of chronic illness  INTERVENTION:   TPN per pharmacy  Recommend thiamine 100mg  daily in TPN x 3 days  Pt at high refeed risk; recommend monitor potassium, magnesium and phosphorus labs daily until stable  Daily weights   NUTRITION DIAGNOSIS:   Moderate Malnutrition related to chronic illness as evidenced by moderate fat depletion, moderate muscle depletion, severe muscle depletion.  GOAL:   Patient will meet greater than or equal to 90% of their needs  MONITOR:   Diet advancement, Labs, Weight trends, Skin, I & O's, Other (Comment) (TPN)  REASON FOR ASSESSMENT:   Consult New TPN/TNA  ASSESSMENT:   82 y/o female with h/o HTN, CKD III, DM, HLD, MDD, MS and a past hernia repair who is admitted with SBO and ischemic changes secondary to internal hernia now s/p exploratory laparotomy with reduction of internal hernia and small bowel resection (~120cms) with primary side to side functional end to end anastomosis 5/27.  Met with pt in room today. Pt reports that she is feeling bad today. Pt reports abdominal cramping, bloating, burping and sore throat. Pt reports passing a small amount of flatus. No BM yet. NGT in place to LIS with output; there is no x-ray to confirm tip location. Pt reports good appetite and oral intake at baseline and reports nausea and abdominal pain that started the day of admission. RD discussed with pt the importance of adequate nutrition needed to preserve lean muscle and support post op healing. Plan today is for PICC line and TPN; this was discussed with patient. Pt is at high refeed risk. Pt is willing to drink Ensure with diet advancement. Pt noted to have ~120cms of small bowel resected; will await pathology report and make nutritional recommendations based on what section and quantity of bowel that was removed.   Per chart, pt appears weight stable  pta. Pt reports her UBW is ~135lbs. Per chart, pt is up ~6lbs since admission.      Medications reviewed and include: insulin, protonix, NaCl w/ KCl @70ml /hr  Labs reviewed: K 3.9 wnl, BUN 26(H), creat 1.10(H), Mg 2.1 wnl P 3.8 wnl- 5/27 Hgb 7.3(L), Hct 22.1(L) Cbgs- 119, 152 x 24 hrs  AIC 5.8- 08/2022  NUTRITION - FOCUSED PHYSICAL EXAM:  Flowsheet Row Most Recent Value  Orbital Region Mild depletion  Upper Arm Region Moderate depletion  Thoracic and Lumbar Region Mild depletion  Buccal Region Mild depletion  Temple Region Mild depletion  Clavicle Bone Region Moderate depletion  Clavicle and Acromion Bone Region Moderate depletion  Scapular Bone Region Moderate depletion  Dorsal Hand Severe depletion  Patellar Region Severe depletion  Anterior Thigh Region Severe depletion  Posterior Calf Region Severe depletion  Edema (RD Assessment) None  Hair Reviewed  Eyes Reviewed  Mouth Reviewed  Skin Reviewed  Nails Reviewed   Diet Order:   Diet Order             Diet NPO time specified Except for: Ice Chips  Diet effective now                  EDUCATION NEEDS:   Education needs have been addressed  Skin:  Skin Assessment: Reviewed RN Assessment (incision abdomen)  Last BM:  5/26  Height:   Ht Readings from Last 1 Encounters:  12/07/22 5\' 5"  (1.651 m)    Weight:   Wt Readings from Last 1 Encounters:  12/09/22 64.9 kg  Ideal Body Weight:  56.8 kg  BMI:  Body mass index is 23.8 kg/m.  Estimated Nutritional Needs:   Kcal:  1500-1700kcal/day  Protein:  75-85g/day  Fluid:  1.5-1.7L/day  Betsey Holiday MS, RD, LDN Please refer to [facility] for RD and/or RD on-call/weekend/after hours pager

## 2022-12-09 NOTE — Progress Notes (Signed)
PHARMACY - TOTAL PARENTERAL NUTRITION CONSULT NOTE   Indication: Small bowel obstruction  Patient Measurements: Height: 5\' 5"  (165.1 cm) Weight: 62 kg (136 lb 11 oz) IBW/kg (Calculated) : 57 TPN AdjBW (KG): 62 Body mass index is 22.75 kg/m.  Assessment: 82 y.o.Post-Op s/p exploratory laparotomy and small bowel resection    Glucose / Insulin: nondiabetic/no insulin prior to TPN required Electrolytes: electrolytes wnl Renal: SCr~1.1 Hepatic: LFTs, bilirubin wnl ab baseline Intake / Output; MIVF: NaCl 0.9% w/ 20 mEq KCl/L GI Imaging: No new pertinent imaging studies  GI Surgeries / Procedures:  05/27 exploratory laparotomy and small bowel resection   Central access: 12/09/22 (pending) TPN start date: 12/09/22  Nutritional Goals: Goal TPN rate is 60 mL/hr (provides 85 g of protein and 1603 kcals per day)  RD Assessment: 1500-1700kcal/day, 75-85g/day protein and 1.5-1.7L/day fluid     Current Nutrition:  NPO  Plan:  ---Start TPN at 54mL/hr at 1800 ---Electrolytes in TPN (standard): Na 61mEq/L, K 48mEq/L, Ca 37mEq/L, Mg 71mEq/L, and Phos 29mmol/L. Cl:Ac 1:1 ---Add standard MVI, thiamine 100 mg and trace elements to TPN ---Initiate Sensitive q6h SSI and adjust as needed  ---Reduce MIVF to 70 mL/hr at 1800 ---Monitor TPN labs on Mon/Thurs, daily until stable  Lowella Bandy 12/09/2022,9:33 AM

## 2022-12-09 NOTE — Progress Notes (Addendum)
[facility] SURGICAL PROGRESS NOTE  Hospital Day(s): 2.   Post op day(s): 2 Days Post-Op.   Interval History:  Patient seen and examined No acute events or new complaints overnight.  Patient reports she had increase soreness overnight No fever, chills, nausea, emesis  Continues without leukocytosis; 10.4K Hgb to 7.3; likely dilutional given significant drop in all cell lines - no evidence of bleeding  Renal function at baseline; sCr - 1.10; UO - 1100 ccs NGT in place; 200 ccs out No reports of flatus  Vital signs in last 24 hours: [min-max] current  Temp:  [98 F (36.7 C)-98.6 F (37 C)] 98.1 F (36.7 C) (05/28 0333) Pulse Rate:  [59-74] 65 (05/28 0333) Resp:  [18] 18 (05/28 0333) BP: (111-138)/(59-70) 111/63 (05/28 0333) SpO2:  [100 %] 100 % (05/28 0333)     Height: 5\' 5"  (165.1 cm) Weight: 62 kg BMI (Calculated): 22.75   Intake/Output last 2 shifts:  05/27 0701 - 05/28 0700 In: 2375.2 [I.V.:2175.2; IV Piggyback:200] Out: 1300 [Urine:1100; Emesis/NG output:200]   Physical Exam:  Constitutional: alert, cooperative and no distress  HEENT: NGT in place Respiratory: breathing non-labored at rest  Cardiovascular: regular rate and sinus rhythm  Gastrointestinal: Soft, incisional soreness, and non-distended. No rebound/guarding Genitourinary: Foley catheter in place Integumentary: Laparotomy is intact with staples; honeycomb in place with serosanguinous drainage  Labs:     Latest Ref Rng & Units 12/09/2022    6:21 AM 12/08/2022    5:01 AM 12/07/2022    8:30 PM  CBC  WBC 4.0 - 10.5 K/uL 10.4  9.5  11.0   Hemoglobin 12.0 - 15.0 g/dL 7.3  95.6  21.3   Hematocrit 36.0 - 46.0 % 22.1  33.6  35.8   Platelets 150 - 400 K/uL 169  207  253       Latest Ref Rng & Units 12/09/2022    6:21 AM 12/08/2022    5:01 AM 12/07/2022    8:30 PM  CMP  Glucose 70 - 99 mg/dL 086  578  469   BUN 8 - 23 mg/dL 26  18  22    Creatinine 0.44 - 1.00 mg/dL 6.29  5.28  4.13   Sodium  135 - 145 mmol/L 144  140  141   Potassium 3.5 - 5.1 mmol/L 3.9  3.8  3.3   Chloride 98 - 111 mmol/L 116  111  105   CO2 22 - 32 mmol/L 22  19  21    Calcium 8.9 - 10.3 mg/dL 7.7  8.0  9.6   Total Protein 6.5 - 8.1 g/dL  6.2  8.0   Total Bilirubin 0.3 - 1.2 mg/dL  0.8  1.2   Alkaline Phos 38 - 126 U/L  58  88   AST 15 - 41 U/L  35  39   ALT 0 - 44 U/L  19  21      Imaging studies: No new pertinent imaging studies   Assessment/Plan:  82 y.o. female 2 Days Post-Op s/p exploratory laparotomy and small bowel resection (120 cms) secondary to internal hernia   - Will go ahead and consult for PICC + TPN this morning  - Continue NGT decompression; LIS; monitor and record output  - Continue NPO + IVF support - Okay to continue foley catheter for now; low threshold to remove once moving - Monitor abdominal examination; on-going bowel function  - will get morning KUB - Pain control prn; antiemetics prn - Monitor H&H; likely dilutional -  no evidence of bleed   - Mobilize; PT    All of the above findings and recommendations were discussed with the patient, and the medical team, and all of patient's questions were answered to her expressed satisfaction.  -- Lynden Oxford, PA-C [facility] 12/09/2022, 7:26 AM M-F: 7am - 4pm

## 2022-12-09 NOTE — Progress Notes (Signed)
Progress Note   Patient: Melanie Gray WUJ:[id number] DOB: 09-21-1940 DOA: 12/07/2022     2 DOS: the patient was seen and examined on 12/09/2022    Subjective:  Patient seen in his bed at bedside this morning  Surgeon planning on initiating TPN Hemoglobin down trended but no evidence of active bleeding Denies nausea vomiting chest pain cough or urinary complaints NG tube still in place     Brief hospital course:   From HPI "Tawona Dever is a 82 y.o. female with past medical history of Medical consultation for medical management on 82 year old female who is s/p emergent laparotomy for small bowel obstruction. Past medical history significant for DM, HTN, CKD 3A, HLD, depression, urge incontinence and osteoarthritis. Patient underwent partial small bowel resection and was patient was transferred to PACU in stable condition.  Operative report still pending. At the time of my evaluation, patient is resting comfortably in PACU.  She is still drowsy from the effects of anesthesia and will awake to gentle shaking but will readily fall back asleep. Vitals on monitor shows sinus bradycardia in the 50s, SBP around 100, compared to the 150s on arrival. Preoperative labs were significant for WBC 11,000 with lactic acid 4.7.  Lipase and LFTs were normal.  Creatinine at baseline at 1.06.  Hemoglobin at baseline at 11.0.  Troponin of 10. EKG, personally viewed and interpreted showed NSR at 74 with nonspecific ST-T wave changes. "   Assessment and Plan: * Small bowel obstruction [facility]) S/p laparotomy 12/08/2022 Patient currently with NG tube Continued management per surgery Surgeon planning on initiating TPN after PICC line insertion Still continues to remain n.p.o.  Postoperative anemia versus dilutional from IV fluid Patient has no worsening abdominal findings No evidence of ongoing bleeding Follow-up on [facility] Surgery team aware and monitoring Continue to monitor H&H  Sinus bradycardia Patient had  sinus bradycardia postoperatively mid 40s to 50s, improving to the 60s when awakened Avoid AV nodal blockers Continuous cardiac monitoring   Dyslipidemia Will hold off on home statins while n.p.o.   Diabetes mellitus with renal manifestation (HCC) Sliding scale coverage while n.p.o.   Chronic kidney disease, stage 3a (HCC) Renal function at baseline   Chronic anemia Hemoglobin is at baseline   Benign hypertension Hydralazine IV as needed n.p.o.     Physical Exam: Constitutional:      General: She is sleeping. She is not in acute distress.    Comments: NG tube in place HENT:     Head: Normocephalic and atraumatic.  Cardiovascular:     Rate and Rhythm: Regular rhythm. Bradycardia present.     Heart sounds: Normal heart sounds.  Pulmonary:     Effort: Pulmonary effort is normal.     Breath sounds: Normal breath sounds.  Abdominal:     Tenderness: There is no abdominal tenderness.  Neurological:     General: No focal deficit present.          Data Reviewed I have personally reviewed patient's laboratory results today showing sodium 144 potassium 3.9 creatinine 1.1 WBC 10.4 hemoglobin 7.3   Family Communication: Discussed with patient's son    Time spent: 50 minutes     Vitals:   12/08/22 1553 12/08/22 1937 12/09/22 0333 12/09/22 0847  BP: 132/64 (!) 124/59 111/63 112/64  Pulse: 67 (!) 59 65 75  Resp: 18 18 18 18   Temp: 98 F (36.7 C) 98.6 F (37 C) 98.1 F (36.7 C) 98 F (36.7 C)  TempSrc:  Oral Oral  SpO2: 100% 100% 100% 100%  Weight:      Height:         Author: Loyce Dys, MD 12/09/2022 1:52 PM  For on call review [email or web address].

## 2022-12-09 NOTE — Evaluation (Signed)
Physical Therapy Evaluation Patient Details Name: Melanie Gray MRN: [id number] DOB: Mar 14, 1941 Today's Date: 12/09/2022  History of Present Illness  Pt admitted for SBO and is s/p exploratory laparotomy on 12/08/22. History includes DM, HTN, CKD, HLD, depression, and MS.  Clinical Impression  Pt is a pleasant 82 year old female who was admitted for SBO. Pt performs bed mobility/transfers with mod assist and unable to ambulate at this time. Pt stood with QC, however very unsteady. Balance improves with RW. Education given to patient/family about increasing mobility opportunities. Pt demonstrates deficits with strength/mobility/balance. SLight amount of emesis noted with exertion, RN notified. During standing attempts, pt unable to demonstrate forward lean and decreased B Knee ROM. Would benefit from skilled PT to address above deficits and promote optimal return to PLOF. Pt will continue to receive skilled PT services while admitted and will defer to TOC/care team for updates regarding disposition planning.       Recommendations for follow up therapy are one component of a multi-disciplinary discharge planning process, led by the attending physician.  Recommendations may be updated based on patient status, additional functional criteria and insurance authorization.  Follow Up Recommendations Can patient physically be transported by private vehicle: No     Assistance Recommended at Discharge Frequent or constant Supervision/Assistance  Patient can return home with the following  A lot of help with walking and/or transfers;A lot of help with bathing/dressing/bathroom;Help with stairs or ramp for entrance    Equipment Recommendations Rolling walker (2 wheels)  Recommendations for Other Services       Functional Status Assessment Patient has had a recent decline in their functional status and demonstrates the ability to make significant improvements in function in a reasonable and predictable  amount of time.     Precautions / Restrictions Precautions Precautions: Fall Restrictions Weight Bearing Restrictions: No      Mobility  Bed Mobility Overal bed mobility: Needs Assistance Bed Mobility: Supine to Sit, Sit to Supine     Supine to sit: Mod assist Sit to supine: Mod assist, +2 for physical assistance   General bed mobility comments: needs assist for B LE management and trunkal elevation. Once seated, pt with post lean and fatigue quickly. Needs assist for scooting towards EOB. Needed +2 for scooting up towards HOB.    Transfers Overall transfer level: Needs assistance Equipment used: Rolling walker (2 wheels) Transfers: Sit to/from Stand Sit to Stand: Mod assist           General transfer comment: needs cues for sequencing and poor power noted during lift off. INitially stood with QC, however due to poor balance, advanced to RW with slight improvement. Poor tolerance for upright posture and fatigues quickly    Ambulation/Gait               General Gait Details: unable to tolerate at this time, fatigues quickly  Stairs            Wheelchair Mobility    Modified Rankin (Stroke Patients Only)       Balance Overall balance assessment: Needs assistance, History of Falls Sitting-balance support: Feet supported Sitting balance-Leahy Scale: Poor     Standing balance support: During functional activity Standing balance-Leahy Scale: Poor                               Pertinent Vitals/Pain Pain Assessment Pain Assessment: Faces Faces Pain Scale: Hurts worst Pain Location: abdomen Pain Descriptors /  Indicators: Operative site guarding Pain Intervention(s): Limited activity within patient's tolerance, Repositioned, Premedicated before session    Home Living Family/patient expects to be discharged to:: Private residence Living Arrangements: Children (lives with son) Available Help at Discharge: Family;Available 24  hours/day Type of Home: House Home Access: Stairs to enter Entrance Stairs-Rails: Left;Right (too wide to reach at same time) Entrance Stairs-Number of Steps: 2   Home Layout: Laundry or work area in basement;Able to live on main level with bedroom/bathroom Home Equipment: Gilmer Mor - quad      Prior Function Prior Level of Function : Independent/Modified Independent;History of Falls (last six months)             Mobility Comments: reports she was using QC, however reports she has had multiple falls recently. Doesn't drive ADLs Comments: reports taking "bird baths" due to not being able to step over the bath tub ledge     Hand Dominance        Extremity/Trunk Assessment   Upper Extremity Assessment Upper Extremity Assessment: Generalized weakness    Lower Extremity Assessment Lower Extremity Assessment: Generalized weakness (B LE grossly 3/5 and limited knee ROM R<L)       Communication   Communication: No difficulties  Cognition Arousal/Alertness: Awake/alert Behavior During Therapy: WFL for tasks assessed/performed Overall Cognitive Status: Within Functional Limits for tasks assessed                                 General Comments: pleasant and agreeable to session        General Comments General comments (skin integrity, edema, etc.): B LE with increased edema    Exercises     Assessment/Plan    PT Assessment Patient needs continued PT services  PT Problem List Decreased strength;Decreased activity tolerance;Decreased balance;Decreased mobility;Decreased knowledge of use of DME;Decreased knowledge of precautions;Pain       PT Treatment Interventions DME instruction;Gait training;Stair training;Therapeutic activities;Therapeutic exercise;Balance training    PT Goals (Current goals can be found in the Care Plan section)  Acute Rehab PT Goals Patient Stated Goal: to get stronger PT Goal Formulation: With patient Time For Goal Achievement:  12/23/22 Potential to Achieve Goals: Good    Frequency Min 3X/week     Co-evaluation               AM-PAC PT "6 Clicks" Mobility  Outcome Measure Help needed turning from your back to your side while in a flat bed without using bedrails?: A Lot Help needed moving from lying on your back to sitting on the side of a flat bed without using bedrails?: A Lot Help needed moving to and from a bed to a chair (including a wheelchair)?: Total Help needed standing up from a chair using your arms (e.g., wheelchair or bedside chair)?: A Lot Help needed to walk in hospital room?: Total Help needed climbing 3-5 steps with a railing? : Total 6 Click Score: 9    End of Session   Activity Tolerance: Patient limited by pain Patient left: in bed;with bed alarm set;with SCD's reapplied;with family/visitor present Nurse Communication: Mobility status PT Visit Diagnosis: Unsteadiness on feet (R26.81);Repeated falls (R29.6);Muscle weakness (generalized) (M62.81);History of falling (Z91.81);Difficulty in walking, not elsewhere classified (R26.2);Pain Pain - Right/Left:  (abdominal)    Time: 4098-1191 PT Time Calculation (min) (ACUTE ONLY): 30 min   Charges:   PT Evaluation $PT Eval Low Complexity: 1 Low PT Treatments $Therapeutic Activity: 8-22 mins  Elizabeth Palau, PT, DPT, GCS [phone]   Novice Vrba 12/09/2022, 4:55 PM

## 2022-12-10 ENCOUNTER — Inpatient Hospital Stay: Payer: Medicare Other

## 2022-12-10 DIAGNOSIS — N1831 Chronic kidney disease, stage 3a: Secondary | ICD-10-CM | POA: Diagnosis not present

## 2022-12-10 DIAGNOSIS — E440 Moderate protein-calorie malnutrition: Secondary | ICD-10-CM | POA: Insufficient documentation

## 2022-12-10 DIAGNOSIS — K56609 Unspecified intestinal obstruction, unspecified as to partial versus complete obstruction: Secondary | ICD-10-CM | POA: Diagnosis not present

## 2022-12-10 DIAGNOSIS — E870 Hyperosmolality and hypernatremia: Secondary | ICD-10-CM | POA: Insufficient documentation

## 2022-12-10 LAB — VITAMIN B12: Vitamin B-12: 1316 pg/mL — ABNORMAL HIGH (ref 180–914)

## 2022-12-10 LAB — BPAM RBC
ISSUE DATE / TIME: 202405290925
Unit Type and Rh: 7300

## 2022-12-10 LAB — GLUCOSE, CAPILLARY
Glucose-Capillary: 155 mg/dL — ABNORMAL HIGH (ref 70–99)
Glucose-Capillary: 158 mg/dL — ABNORMAL HIGH (ref 70–99)
Glucose-Capillary: 146 mg/dL — ABNORMAL HIGH (ref 70–99)
Glucose-Capillary: 163 mg/dL — ABNORMAL HIGH (ref 70–99)

## 2022-12-10 LAB — BASIC METABOLIC PANEL
Anion gap: 4 — ABNORMAL LOW (ref 5–15)
BUN: 29 mg/dL — ABNORMAL HIGH (ref 8–23)
CO2: 23 mmol/L (ref 22–32)
Calcium: 8.2 mg/dL — ABNORMAL LOW (ref 8.9–10.3)
Chloride: 120 mmol/L — ABNORMAL HIGH (ref 98–111)
Creatinine, Ser: 1.05 mg/dL — ABNORMAL HIGH (ref 0.44–1.00)
GFR, Estimated: 53 mL/min — ABNORMAL LOW (ref >60)
Glucose, Bld: 158 mg/dL — ABNORMAL HIGH (ref 70–99)
Potassium: 4.2 mmol/L (ref 3.5–5.1)
Sodium: 147 mmol/L — ABNORMAL HIGH (ref 135–145)

## 2022-12-10 LAB — PREPARE RBC (CROSSMATCH)

## 2022-12-10 LAB — AEROBIC/ANAEROBIC CULTURE W GRAM STAIN (SURGICAL/DEEP WOUND): Gram Stain: NONE SEEN

## 2022-12-10 LAB — ABO/RH: ABO/RH(D): B POS

## 2022-12-10 LAB — CBC
HCT: 19.5 % — ABNORMAL LOW (ref 36.0–46.0)
Hemoglobin: 6.4 g/dL — ABNORMAL LOW (ref 12.0–15.0)
MCH: 29.1 pg (ref 26.0–34.0)
MCHC: 32.8 g/dL (ref 30.0–36.0)
MCV: 88.6 fL (ref 80.0–100.0)
Platelets: 160 10*3/uL (ref 150–400)
RBC: 2.2 MIL/uL — ABNORMAL LOW (ref 3.87–5.11)
RDW: 17.3 % — ABNORMAL HIGH (ref 11.5–15.5)
WBC: 11.5 10*3/uL — ABNORMAL HIGH (ref 4.0–10.5)
nRBC: 0 % (ref 0.0–0.2)

## 2022-12-10 LAB — PROTIME-INR
INR: 1.2 (ref 0.8–1.2)
Prothrombin Time: 15 seconds (ref 11.4–15.2)

## 2022-12-10 LAB — FERRITIN: Ferritin: 383 ng/mL — ABNORMAL HIGH (ref 11–307)

## 2022-12-10 LAB — IRON AND TIBC
Iron: 29 ug/dL (ref 28–170)
Saturation Ratios: 20 % (ref 10.4–31.8)
TIBC: 148 ug/dL — ABNORMAL LOW (ref 250–450)
UIBC: 119 ug/dL

## 2022-12-10 LAB — TYPE AND SCREEN
ABO/RH(D): B POS
Unit division: 0

## 2022-12-10 LAB — MAGNESIUM: Magnesium: 2.3 mg/dL (ref 1.7–2.4)

## 2022-12-10 LAB — PHOSPHORUS: Phosphorus: 1.6 mg/dL — ABNORMAL LOW (ref 2.5–4.6)

## 2022-12-10 MED ORDER — PHENOL 1.4 % MT LIQD
1.0000 | OROMUCOSAL | Status: DC | PRN
  Administered 2022-12-10: 1 via OROMUCOSAL
  Filled 2022-12-10: qty 177

## 2022-12-10 MED ORDER — SODIUM CHLORIDE 0.9% IV SOLUTION: Freq: Once | INTRAVENOUS | Status: DC

## 2022-12-10 MED ORDER — TRACE MINERALS CU-MN-SE-ZN 300-55-60-3000 MCG/ML IV SOLN
INTRAVENOUS | Status: AC
  Filled 2022-12-10: qty 566.4

## 2022-12-10 MED ORDER — FUROSEMIDE 10 MG/ML IJ SOLN
20.0000 mg | Freq: Once | INTRAMUSCULAR | Status: AC
  Administered 2022-12-10: 20 mg via INTRAVENOUS
  Filled 2022-12-10: qty 4

## 2022-12-10 NOTE — Hospital Course (Addendum)
Melanie Gray is a 82 y.o. female with past medical history of Medical consultation for medical management on 82 year old female who is s/p emergent laparotomy for small bowel obstruction. Past medical history significant for DM, HTN, CKD 3A, HLD, depression, urge incontinence and osteoarthritis. Patient underwent partial small bowel resection on 5/27, patient developed post op ileus, was started on NG suction.  She is also started on TPN. Patient started having bowel movement 5/31, liquid diet started.  NG tube removed.

## 2022-12-10 NOTE — Progress Notes (Signed)
Off the floor for X-ray.

## 2022-12-10 NOTE — Progress Notes (Signed)
Orders clarified with Dr Chipper Herb. Patient is to get a total of 2 units of blood today

## 2022-12-10 NOTE — Evaluation (Addendum)
Occupational Therapy Evaluation Patient Details Name: Melanie Gray MRN: [id number] DOB: 16-Dec-1940 Today's Date: 12/10/2022   History of Present Illness Pt admitted for SBO and is s/p exploratory laparotomy on 12/08/22. History includes DM, HTN, CKD, HLD, depression, and MS.   Clinical Impression   Ms. Puccia was seen for OT evaluation this date. Pt cleared for participate in therapy session by MD who was in room upon OT's arrival. Prior to hospital admission, pt was MOD I for ADL management. She reports using a QC for household mobility and sponge bathing at baseline. Pt lives with her son in a 1 level home with 2 STE. Pt presents to acute OT demonstrating impaired ADL performance and functional mobility 2/2 generalized weakness, increased pain with functional activity, and impaired balance (See OT problem list for additional functional deficits). Pt currently requires MOD A for bed mobility using log-roll technique to maximize comfort and safety as well as MOD A for LB ADL management from STS.  Pt would benefit from skilled OT services to address noted impairments and functional limitations (see below for any additional details) in order to maximize safety and independence while minimizing falls risk and caregiver burden. Anticipate the need for ongoing OT services upon acute hospital DC (< 3 hours/week)      Recommendations for follow up therapy are one component of a multi-disciplinary discharge planning process, led by the attending physician.  Recommendations may be updated based on patient status, additional functional criteria and insurance authorization.   Assistance Recommended at Discharge Intermittent Supervision/Assistance  Patient can return home with the following A lot of help with walking and/or transfers;A lot of help with bathing/dressing/bathroom;Assistance with cooking/housework;Help with stairs or ramp for entrance;Assist for transportation    Functional Status Assessment      Equipment Recommendations  BSC/3in1    Recommendations for Other Services       Precautions / Restrictions Precautions Precautions: Fall Restrictions Weight Bearing Restrictions: No      Mobility Bed Mobility Overal bed mobility: Needs Assistance Bed Mobility: Supine to Sit, Sit to Supine     Supine to sit: Mod assist Sit to supine: Mod assist, +2 for physical assistance        Transfers Overall transfer level: Needs assistance                 General transfer comment: deferred. RN in room to hang blood, pt assisted back to bed.      Balance Overall balance assessment: Needs assistance, History of Falls Sitting-balance support: Feet supported Sitting balance-Leahy Scale: Fair Sitting balance - Comments: steady sitting, reaching inside BOS. Fatigues quickly                                   ADL either performed or assessed with clinical judgement   ADL Overall ADL's : Needs assistance/impaired                                       General ADL Comments: Funcitonally limited by increased pain with mobility, decreased LB access 2/2 abdominal incision, generalized weakness, and limited balance. Requires MOD A to come to sit at EOB. Anticipate MOD A for LB ADL management from STS. SET UP assist for seated UB ADL management.     Vision Baseline Vision/History: 1 Wears glasses Ability to See in Adequate Light:  1 Impaired Patient Visual Report: No change from baseline       Perception     Praxis      Pertinent Vitals/Pain Pain Assessment Pain Assessment: 0-10 Pain Score: 6  Pain Location: abdomen Pain Descriptors / Indicators: Operative site guarding, Guarding, Grimacing Pain Intervention(s): Limited activity within patient's tolerance, Repositioned, Monitored during session, Utilized relaxation techniques (Gave pillow to hug during mobility for abdominal bracing.)     Hand Dominance     Extremity/Trunk Assessment  Upper Extremity Assessment Upper Extremity Assessment: Generalized weakness   Lower Extremity Assessment Lower Extremity Assessment: Generalized weakness       Communication Communication Communication: No difficulties   Cognition Arousal/Alertness: Awake/alert Behavior During Therapy: WFL for tasks assessed/performed Overall Cognitive Status: Within Functional Limits for tasks assessed                                 General Comments: pleasant and agreeable to session     General Comments  NGT in place with suction active at start/end of session.    Exercises Other Exercises Other Exercises: Pt educated on role of OT in acute setting, safe use of AE/DME for ADL management, routines modifications to support safety and functional indpendence during ADL management and bed mobility techniques to maximize safety and comfort with functional transfers.   Shoulder Instructions      Home Living Family/patient expects to be discharged to:: Private residence Living Arrangements: Children (lives with son) Available Help at Discharge: Family;Available 24 hours/day Type of Home: House Home Access: Stairs to enter [employer] of Steps: 2 Entrance Stairs-Rails: Left;Right Home Layout: Laundry or work area in basement;Able to live on main level with bedroom/bathroom     Bathroom Shower/Tub: Sponge bathes at baseline                    Prior Functioning/Environment Prior Level of Function : Independent/Modified Independent;History of Falls (last six months)             Mobility Comments: reports she was using QC, however reports she has had multiple falls recently. ADLs Comments: Generally MOD I for ADL management, reports sponge bathing at baseline, does not drive. Son assists with IADL management.        OT Problem List: Decreased strength;Decreased coordination;Pain;Decreased range of motion;Decreased activity tolerance;Decreased safety  awareness;Impaired balance (sitting and/or standing);Decreased knowledge of use of DME or AE;Impaired UE functional use      OT Treatment/Interventions: Self-care/ADL training;Therapeutic exercise;Therapeutic activities;DME and/or AE instruction;Patient/family education;Balance training;Energy conservation    OT Goals(Current goals can be found in the care plan section) Acute Rehab OT Goals Patient Stated Goal: To feel better and go home OT Goal Formulation: With patient Time For Goal Achievement: 12/24/22 Potential to Achieve Goals: Good ADL Goals Pt Will Perform Lower Body Dressing: sit to/from stand;with set-up;with supervision Pt Will Transfer to Toilet: ambulating;bedside commode;with set-up;with supervision Pt Will Perform Toileting - Clothing Manipulation and hygiene: sit to/from stand;sitting/lateral leans;with set-up;with supervision;with adaptive equipment  OT Frequency: Min 2X/week    Co-evaluation              AM-PAC OT "6 Clicks" Daily Activity     Outcome Measure Help from another person eating meals?: A Little (functionally. NPO at this time) Help from another person taking care of personal grooming?: A Little Help from another person toileting, which includes using toliet, bedpan, or urinal?: A Lot Help from  another person bathing (including washing, rinsing, drying)?: A Lot Help from another person to put on and taking off regular upper body clothing?: A Little Help from another person to put on and taking off regular lower body clothing?: A Lot 6 Click Score: 15   End of Session Equipment Utilized During Treatment: Gait belt;Rolling walker (2 wheels) Nurse Communication: Mobility status  Activity Tolerance: Patient tolerated treatment well Patient left: in bed;with call bell/phone within reach;with bed alarm set;with family/visitor present  OT Visit Diagnosis: Other abnormalities of gait and mobility (R26.89);Muscle weakness (generalized) (M62.81);Pain Pain -  Right/Left:  (both) Pain - part of body:  (abdomen)                Time: 1610-9604 OT Time Calculation (min): 27 min Charges:  OT General Charges $OT Visit: 1 Visit OT Evaluation $OT Eval Moderate Complexity: 1 Mod OT Treatments $Self Care/Home Management : 8-22 mins  Rockney Ghee, M.S., OTR/L 12/10/22, 11:30 AM

## 2022-12-10 NOTE — Progress Notes (Signed)
Physical Therapy Treatment Patient Details Name: Melanie Gray MRN: [id number] DOB: 1940/10/15 Today's Date: 12/10/2022   History of Present Illness Pt admitted for SBO and is s/p exploratory laparotomy on 12/08/22. History includes DM, HTN, CKD, HLD, depression, and MS.    PT Comments    Pt is making good progress towards goals with slightly improved B LE strength this session. Actively receiving blood transfusion, therefore OOB mobility deferred. Reports she previously dangled with OT earlier this date prior to transfusion. Able to perform supine there-ex and eager to participate. Will continue to progress.   Recommendations for follow up therapy are one component of a multi-disciplinary discharge planning process, led by the attending physician.  Recommendations may be updated based on patient status, additional functional criteria and insurance authorization.  Follow Up Recommendations  Can patient physically be transported by private vehicle: No    Assistance Recommended at Discharge Frequent or constant Supervision/Assistance  Patient can return home with the following A lot of help with walking and/or transfers;A lot of help with bathing/dressing/bathroom;Help with stairs or ramp for entrance   Equipment Recommendations  Rolling walker (2 wheels)    Recommendations for Other Services       Precautions / Restrictions Precautions Precautions: Fall Restrictions Weight Bearing Restrictions: No     Mobility  Bed Mobility               General bed mobility comments: not performed as pt actively receiving blood transfusion    Transfers                        Ambulation/Gait                   Stairs             Wheelchair Mobility    Modified Rankin (Stroke Patients Only)       Balance                                            Cognition Arousal/Alertness: Awake/alert Behavior During Therapy: WFL for tasks  assessed/performed Overall Cognitive Status: Within Functional Limits for tasks assessed                                 General Comments: pleasant and agreeable to session        Exercises Other Exercises Other Exercises: supine ther-ex performed on B LE including SAQ, hip abd/add, heel slides, and hip add squeezes. 10 reps performed with min/mod assist. Decreased B knee ROM. Other Exercises: education given and promoted to pt/family to increase mobility opportunities once blood transfusion completed    General Comments General comments (skin integrity, edema, etc.): NGT in place with suction active at start/end of session.      Pertinent Vitals/Pain Pain Assessment Pain Assessment: Faces Faces Pain Scale: Hurts even more Pain Location: abdomen Pain Descriptors / Indicators: Operative site guarding, Guarding, Grimacing Pain Intervention(s): Limited activity within patient's tolerance    Home Living Family/patient expects to be discharged to:: Private residence Living Arrangements: Children Available Help at Discharge: Family;Available 24 hours/day Type of Home: House Home Access: Stairs to enter Entrance Stairs-Rails: [employer] of Steps: 2   Home Layout: Laundry or work area in basement;Able to live on main level with bedroom/bathroom  Prior Function            PT Goals (current goals can now be found in the care plan section) Acute Rehab PT Goals Patient Stated Goal: to get stronger PT Goal Formulation: With patient Time For Goal Achievement: 12/23/22 Potential to Achieve Goals: Good Progress towards PT goals: Progressing toward goals    Frequency    Min 3X/week      PT Plan Current plan remains appropriate    Co-evaluation              AM-PAC PT "6 Clicks" Mobility   Outcome Measure  Help needed turning from your back to your side while in a flat bed without using bedrails?: A Lot Help needed moving  from lying on your back to sitting on the side of a flat bed without using bedrails?: A Lot Help needed moving to and from a bed to a chair (including a wheelchair)?: Total Help needed standing up from a chair using your arms (e.g., wheelchair or bedside chair)?: A Lot Help needed to walk in hospital room?: Total Help needed climbing 3-5 steps with a railing? : Total 6 Click Score: 9    End of Session   Activity Tolerance: Patient limited by pain Patient left: in bed;with bed alarm set;with SCD's reapplied;with family/visitor present Nurse Communication: Mobility status PT Visit Diagnosis: Unsteadiness on feet (R26.81);Repeated falls (R29.6);Muscle weakness (generalized) (M62.81);History of falling (Z91.81);Difficulty in walking, not elsewhere classified (R26.2);Pain Pain - part of body:  (abdomen)     Time: 1610-9604 PT Time Calculation (min) (ACUTE ONLY): 13 min  Charges:  $Therapeutic Exercise: 8-22 mins                     Melanie Gray, PT, DPT, GCS [phone]    Melanie Gray 12/10/2022, 12:00 PM

## 2022-12-10 NOTE — Care Management Important Message (Signed)
Important Message  Patient Details  Name: Melanie Gray MRN: [id number] Date of Birth: [birth year]   Medicare Important Message Given:  Yes     [name] 12/10/2022, 10:12 AM

## 2022-12-10 NOTE — Progress Notes (Signed)
  Progress Note   Patient: Melanie Gray ZOX:[id number] DOB: 1941-04-08 DOA: 12/07/2022     3 DOS: the patient was seen and examined on 12/10/2022   Brief hospital course: Ladrena Ericksen is a 82 y.o. female with past medical history of Medical consultation for medical management on 82 year old female who is s/p emergent laparotomy for small bowel obstruction. Past medical history significant for DM, HTN, CKD 3A, HLD, depression, urge incontinence and osteoarthritis. Patient underwent partial small bowel resection on 5/27, patient developed post op ileus, was started on NG suction.  She is also started on TPN.   Principal Problem:   Small bowel obstruction (HCC) Active Problems:   S/P laparotomy   Sinus bradycardia   Benign hypertension   Chronic anemia   Chronic kidney disease, stage 3a (HCC)   Diabetes mellitus with renal manifestation (HCC)   Dyslipidemia   SBO (small bowel obstruction) (HCC)   Malnutrition of moderate degree   Hypernatremia   Hypophosphatemia   Assessment and Plan:  Small bowel obstruction (HCC) S/p laparotomy 12/08/2022 Postop ileus. Patient had a small bowel resection 2 days ago, as expected, patient bowel has not recovered yet.  Patient was started on TPN after the PICC line was placed.  Will monitor electrolytes closely.  Hypernatremia Hypophosphatemia. Chronic kidney disease stage IIIa. Pharmacy to adjust sodium chloride concentration replete phosphate during the TPN. Renal function still stable.  Postoperative anemia versus dilutional from IV fluid Hemoglobin dropped down to 6.5, general surgery ordered 2 units of PRBC, ordered 20 mg IV Lasix posttransfusion. Check iron B12 level prior to PRBC transfusion.   Sinus bradycardia Stable.   Diabetes mellitus with renal manifestation (HCC) Sliding scale coverage while n.p.o.    Benign hypertension Hydralazine IV as needed n.p.o.      Subjective:  Patient still has not had any bowel movement or passed  gas.  But no nausea vomiting.  Physical Exam: Vitals:   12/10/22 0830 12/10/22 0924 12/10/22 0945 12/10/22 1231  BP: (!) 142/65 (!) 142/64 (!) 141/72 136/64  Pulse: 66 72 67 67  Resp: 18 18 16 16   Temp: (!) 97.5 F (36.4 C) 98.7 F (37.1 C) 97.8 F (36.6 C) 98.6 F (37 C)  TempSrc: Oral Oral  Oral  SpO2: 100% 100% 100% 100%  Weight:      Height:       General exam: Appears calm and comfortable  Respiratory system: Clear to auscultation. Respiratory effort normal. Cardiovascular system: S1 & S2 heard, RRR. No JVD, murmurs, rubs, gallops or clicks. No pedal edema. Gastrointestinal system: Abdomen is nondistended, soft and nontender. No organomegaly or masses felt.  No bowel sounds heard. Central nervous system: Alert and oriented. No focal neurological deficits. Extremities: Symmetric 5 x 5 power. Skin: No rashes, lesions or ulcers Psychiatry: Judgement and insight appear normal. Mood & affect appropriate.    Data Reviewed:  Lab results reviewed.  Family Communication: None  Disposition: Status is: Inpatient Remains inpatient appropriate because: Severity of disease, IV treatment.     Time spent: 35 minutes  Author: Marrion Coy, MD 12/10/2022 12:33 PM  For on call review [email or web address].

## 2022-12-10 NOTE — Progress Notes (Addendum)
[facility] SURGICAL PROGRESS NOTE  Hospital Day(s): 3.   Post op day(s): 3 Days Post-Op.   Interval History:  Patient seen and examined No acute events or new complaints overnight.  Patient reports she continues to have abdominal soreness Sore throat; coughing up phlegm No fever, chills, nausea, emesis  Slight bump in WBC; 11.5K Hgb to 6.4 Renal function at baseline; sCr - 1.05; UO - 810 ccs Hypophosphatemia to 1.6 NGT in place; 810 ccs out No reports of flatus  Vital signs in last 24 hours: [min-max] current  Temp:  [98 F (36.7 C)-98.7 F (37.1 C)] 98.7 F (37.1 C) (05/29 0500) Pulse Rate:  [60-81] 74 (05/29 0500) Resp:  [18] 18 (05/29 0500) BP: (112-132)/(59-64) 127/59 (05/29 0500) SpO2:  [98 %-100 %] 98 % (05/29 0500) Weight:  [64.6 kg-64.9 kg] 64.6 kg (05/29 0500)     Height: 5\' 5"  (165.1 cm) Weight: 64.6 kg BMI (Calculated): 23.7   Intake/Output last 2 shifts:  05/28 0701 - 05/29 0700 In: -  Out: 1410 [Urine:600; Emesis/NG output:810]   Physical Exam:  Constitutional: alert, cooperative and no distress  HEENT: NGT in place Respiratory: breathing non-labored at rest  Cardiovascular: regular rate and sinus rhythm  Gastrointestinal: Soft, incisional soreness, and non-distended. No rebound/guarding Genitourinary: Foley catheter in place Integumentary: Laparotomy is intact with staples; honeycomb removed due to saturation  Labs:     Latest Ref Rng & Units 12/10/2022    4:53 AM 12/09/2022    6:21 AM 12/08/2022    5:01 AM  CBC  WBC 4.0 - 10.5 K/uL 11.5  10.4  9.5   Hemoglobin 12.0 - 15.0 g/dL 6.4  7.3  60.4   Hematocrit 36.0 - 46.0 % 19.5  22.1  33.6   Platelets 150 - 400 K/uL 160  169  207       Latest Ref Rng & Units 12/10/2022    4:53 AM 12/09/2022    6:21 AM 12/08/2022    5:01 AM  CMP  Glucose 70 - 99 mg/dL 540  981  191   BUN 8 - 23 mg/dL 29  26  18    Creatinine 0.44 - 1.00 mg/dL 4.78  2.95  6.21   Sodium 135 - 145 mmol/L 147  144  140    Potassium 3.5 - 5.1 mmol/L 4.2  3.9  3.8   Chloride 98 - 111 mmol/L 120  116  111   CO2 22 - 32 mmol/L 23  22  19    Calcium 8.9 - 10.3 mg/dL 8.2  7.7  8.0   Total Protein 6.5 - 8.1 g/dL   6.2   Total Bilirubin 0.3 - 1.2 mg/dL   0.8   Alkaline Phos 38 - 126 U/L   58   AST 15 - 41 U/L   35   ALT 0 - 44 U/L   19      Imaging studies: No new pertinent imaging studies   Assessment/Plan:  82 y.o. female 3 Days Post-Op s/p exploratory laparotomy and small bowel resection (120 cms) secondary to internal hernia   - Continue TPN; advance to goal  - Continue NGT decompression; LIS; monitor and record output  - Continue NPO + IVF support - Okay to continue foley catheter for now; low threshold to remove pr switch to pure wick  - Monitor abdominal examination; on-going bowel function  - Serial KUB as needed - Pain control prn; antiemetics prn - Monitor H&H; agree with transfusion   - Mobilize;  PT    All of the above findings and recommendations were discussed with the patient, and the medical team, and all of patient's questions were answered to her expressed satisfaction.  -- Lynden Oxford, PA-C [facility] 12/10/2022, 7:11 AM M-F: 7am - 4pm

## 2022-12-10 NOTE — Progress Notes (Signed)
PHARMACY - TOTAL PARENTERAL NUTRITION CONSULT NOTE   Indication: Small bowel obstruction  Patient Measurements: Height: 5\' 5"  (165.1 cm) Weight: 64.6 kg (142 lb 6.7 oz) IBW/kg (Calculated) : 57 TPN AdjBW (KG): 62 Body mass index is 23.7 kg/m.  Assessment: 82 y/o female with h/o HTN, CKD III, DM, HLD, MDD, MS and a past hernia repair who is admitted with SBO and ischemic changes secondary to internal hernia now s/p exploratory laparotomy with reduction of internal hernia and small bowel resection (~120cms) with primary side to side functional end to end anastomosis 5/27.   Glucose / Insulin: BG 119 - 206 / previous 24h requiring 5 units SSI Electrolytes: slight hypernatremia, hypophosphatemia Renal: SCr~1.1 Hepatic: LFTs, bilirubin wnl ab baseline Intake / Output; MIVF: NaCl 0.9% w/ 20 mEq KCl/L GI Imaging: No new pertinent imaging studies  GI Surgeries / Procedures:  05/27 exploratory laparotomy and small bowel resection   Central access: 12/09/22 (pending) TPN start date: 12/09/22  Nutritional Goals: Goal TPN rate is 60 mL/hr (provides 85 g of protein and 1603 kcals per day)  RD Assessment:  Estimated Needs Total Energy Estimated Needs: 1500-1700kcal/day Total Protein Estimated Needs: 75-85g/day Total Fluid Estimated Needs: 1.5-1.7L/day  Current Nutrition:  NPO  Plan:  ---advance TPN  to  75mL/hr  ---Electrolytes in TPN: Na 27mEq/L, K 12mEq/L, Ca 52mEq/L, Mg 27mEq/L, and Phos 29mmol/L. Cl:Ac 1:1 ---Add standard MVI, thiamine 100 mg and trace elements to TPN ---continue Sensitive q6h SSI and adjust as needed  ---Monitor TPN labs on Mon/Thurs, daily until stable  Lowella Bandy 12/10/2022,7:05 AM

## 2022-12-11 DIAGNOSIS — E870 Hyperosmolality and hypernatremia: Secondary | ICD-10-CM

## 2022-12-11 DIAGNOSIS — E872 Acidosis, unspecified: Secondary | ICD-10-CM | POA: Insufficient documentation

## 2022-12-11 DIAGNOSIS — K56609 Unspecified intestinal obstruction, unspecified as to partial versus complete obstruction: Secondary | ICD-10-CM | POA: Diagnosis not present

## 2022-12-11 DIAGNOSIS — K559 Vascular disorder of intestine, unspecified: Secondary | ICD-10-CM | POA: Insufficient documentation

## 2022-12-11 DIAGNOSIS — D62 Acute posthemorrhagic anemia: Secondary | ICD-10-CM | POA: Insufficient documentation

## 2022-12-11 LAB — COMPREHENSIVE METABOLIC PANEL
ALT: 23 U/L (ref 0–44)
AST: 48 U/L — ABNORMAL HIGH (ref 15–41)
Albumin: 2.8 g/dL — ABNORMAL LOW (ref 3.5–5.0)
Alkaline Phosphatase: 49 U/L (ref 38–126)
Anion gap: 7 (ref 5–15)
BUN: 29 mg/dL — ABNORMAL HIGH (ref 8–23)
CO2: 24 mmol/L (ref 22–32)
Calcium: 8.3 mg/dL — ABNORMAL LOW (ref 8.9–10.3)
Chloride: 115 mmol/L — ABNORMAL HIGH (ref 98–111)
Creatinine, Ser: 0.82 mg/dL (ref 0.44–1.00)
GFR, Estimated: >60 mL/min (ref >60)
Glucose, Bld: 152 mg/dL — ABNORMAL HIGH (ref 70–99)
Potassium: 3.5 mmol/L (ref 3.5–5.1)
Sodium: 146 mmol/L — ABNORMAL HIGH (ref 135–145)
Total Bilirubin: 0.8 mg/dL (ref 0.3–1.2)
Total Protein: 5.4 g/dL — ABNORMAL LOW (ref 6.5–8.1)

## 2022-12-11 LAB — CBC
HCT: 27.3 % — ABNORMAL LOW (ref 36.0–46.0)
Hemoglobin: 9.3 g/dL — ABNORMAL LOW (ref 12.0–15.0)
MCH: 28.8 pg (ref 26.0–34.0)
MCHC: 34.1 g/dL (ref 30.0–36.0)
MCV: 84.5 fL (ref 80.0–100.0)
Platelets: 159 10*3/uL (ref 150–400)
RBC: 3.23 MIL/uL — ABNORMAL LOW (ref 3.87–5.11)
RDW: 17.8 % — ABNORMAL HIGH (ref 11.5–15.5)
WBC: 12.6 10*3/uL — ABNORMAL HIGH (ref 4.0–10.5)
nRBC: 0 % (ref 0.0–0.2)

## 2022-12-11 LAB — BPAM RBC
Blood Product Expiration Date: 202406132359
Blood Product Expiration Date: 202406132359
ISSUE DATE / TIME: 202405291242
Unit Type and Rh: 7300

## 2022-12-11 LAB — GLUCOSE, CAPILLARY
Glucose-Capillary: 156 mg/dL — ABNORMAL HIGH (ref 70–99)
Glucose-Capillary: 153 mg/dL — ABNORMAL HIGH (ref 70–99)
Glucose-Capillary: 143 mg/dL — ABNORMAL HIGH (ref 70–99)
Glucose-Capillary: 135 mg/dL — ABNORMAL HIGH (ref 70–99)

## 2022-12-11 LAB — TYPE AND SCREEN
Antibody Screen: NEGATIVE
Unit division: 0

## 2022-12-11 LAB — AEROBIC/ANAEROBIC CULTURE W GRAM STAIN (SURGICAL/DEEP WOUND)

## 2022-12-11 LAB — PROTIME-INR
INR: 1.1 (ref 0.8–1.2)
Prothrombin Time: 14.3 seconds (ref 11.4–15.2)

## 2022-12-11 LAB — PHOSPHORUS: Phosphorus: 2.2 mg/dL — ABNORMAL LOW (ref 2.5–4.6)

## 2022-12-11 LAB — MAGNESIUM: Magnesium: 2.2 mg/dL (ref 1.7–2.4)

## 2022-12-11 MED ORDER — ACETAMINOPHEN 10 MG/ML IV SOLN
1000.0000 mg | Freq: Four times a day (QID) | INTRAVENOUS | Status: AC
  Administered 2022-12-11: 1000 mg via INTRAVENOUS
  Filled 2022-12-11: qty 100

## 2022-12-11 MED ORDER — TRACE MINERALS CU-MN-SE-ZN 300-55-60-3000 MCG/ML IV SOLN
INTRAVENOUS | Status: AC
  Filled 2022-12-11: qty 566.4

## 2022-12-11 MED ORDER — ACETAMINOPHEN 10 MG/ML IV SOLN
1000.0000 mg | Freq: Four times a day (QID) | INTRAVENOUS | Status: DC
  Administered 2022-12-11 (×2): 1000 mg via INTRAVENOUS
  Filled 2022-12-11 (×4): qty 100

## 2022-12-11 NOTE — Progress Notes (Signed)
Patient had 2 episodes of Dark/maroon colored bloody stool MD Ailene Ards made aware.

## 2022-12-11 NOTE — Progress Notes (Signed)
Physical Therapy Treatment Patient Details Name: Melanie Gray MRN: [id number] DOB: 1940-10-26 Today's Date: 12/11/2022   History of Present Illness Pt admitted for SBO and is s/p exploratory laparotomy on 12/08/22. History includes DM, HTN, CKD, HLD, depression, and MS.    PT Comments    Patient is progressing toward goals. Patient was able to ambulate a further distance in their room with a rolling walker and mod assistance. Demonstrated a slow step through gait pattern and fatigues with exertion. Encouraged patient to use BSC after catheter removal. NG tube was disconnected for gait trial by nurse. Patient eager to participate. Will continue to progress as able.    Recommendations for follow up therapy are one component of a multi-disciplinary discharge planning process, led by the attending physician.  Recommendations may be updated based on patient status, additional functional criteria and insurance authorization.  Follow Up Recommendations  Can patient physically be transported by private vehicle: Yes    Assistance Recommended at Discharge Intermittent Supervision/Assistance  Patient can return home with the following A lot of help with walking and/or transfers;Assist for transportation;Help with stairs or ramp for entrance;A lot of help with bathing/dressing/bathroom   Equipment Recommendations  Rolling walker (2 wheels)    Recommendations for Other Services       Precautions / Restrictions Precautions Precautions: Fall Restrictions Weight Bearing Restrictions: No     Mobility  Bed Mobility Overal bed mobility: Needs Assistance Bed Mobility: Supine to Sit     Supine to sit: Mod assist     General bed mobility comments: Needs moderate assistance when coming to EOB. Difficulty maintaining balance at EOB, moderate support needed at her back. Moderate assist needed scooting to EOB. Very slow transitions.    Transfers Overall transfer level: Needs assistance Equipment  used: Rolling walker (2 wheels) Transfers: Sit to/from Stand Sit to Stand: Mod assist           General transfer comment: Cues needed for step sequencing and obstacle clearance. Demonstrates flexed trunk in static/dynamic stance.    Ambulation/Gait Ambulation/Gait assistance: Min assist Gait Distance (Feet): 40 Feet Assistive device: Rolling walker (2 wheels) Gait Pattern/deviations: Step-through pattern, Decreased step length - right, Decreased step length - left, Decreased stance time - right, Decreased stance time - left, Decreased stride length, Shuffle, Trunk flexed, Narrow base of support       General Gait Details: Ambulated 65ft to the door and back to the chair. Gait speed was decreased with increased trunk flexion. Cues for RW sequencing. At end of gait trial patient reports R knee giving out and required chair to be brought closer. Additional assist needed to transfer to chair.   Stairs             Wheelchair Mobility    Modified Rankin (Stroke Patients Only)       Balance Overall balance assessment: Needs assistance, History of Falls Sitting-balance support: Bilateral upper extremity supported, Feet supported Sitting balance-Leahy Scale: Fair     Standing balance support: Bilateral upper extremity supported, Reliant on assistive device for balance, During functional activity Standing balance-Leahy Scale: Fair                              Cognition Arousal/Alertness: Awake/alert Behavior During Therapy: WFL for tasks assessed/performed Overall Cognitive Status: Within Functional Limits for tasks assessed  General Comments: pleasant and agreeable to session        Exercises      General Comments        Pertinent Vitals/Pain Pain Assessment Pain Assessment: Faces Faces Pain Scale: Hurts even more Breathing: normal Pain Location: abdomen Pain Descriptors / Indicators: Operative site  guarding, Guarding, Grimacing Pain Intervention(s): Limited activity within patient's tolerance, Repositioned, Monitored during session    Home Living                          Prior Function            PT Goals (current goals can now be found in the care plan section) Acute Rehab PT Goals Patient Stated Goal: to get stronger PT Goal Formulation: With patient Time For Goal Achievement: 12/23/22 Potential to Achieve Goals: Good Progress towards PT goals: Progressing toward goals    Frequency    Min 3X/week      PT Plan Current plan remains appropriate    Co-evaluation              AM-PAC PT "6 Clicks" Mobility   Outcome Measure  Help needed turning from your back to your side while in a flat bed without using bedrails?: A Lot Help needed moving from lying on your back to sitting on the side of a flat bed without using bedrails?: A Lot Help needed moving to and from a bed to a chair (including a wheelchair)?: A Lot Help needed standing up from a chair using your arms (e.g., wheelchair or bedside chair)?: A Lot Help needed to walk in hospital room?: A Lot Help needed climbing 3-5 steps with a railing? : A Lot 6 Click Score: 12    End of Session   Activity Tolerance: Patient tolerated treatment well Patient left: in chair;with chair alarm set;with call bell/phone within reach Nurse Communication: Mobility status PT Visit Diagnosis: Unsteadiness on feet (R26.81);Repeated falls (R29.6);Muscle weakness (generalized) (M62.81);History of falling (Z91.81);Difficulty in walking, not elsewhere classified (R26.2);Pain Pain - Right/Left:  (abdominal)     Time: 1021-1050 PT Time Calculation (min) (ACUTE ONLY): 29 min  Charges:                       Malachi Carl, SPT   Malachi Carl 12/11/2022, 3:05 PM

## 2022-12-11 NOTE — Progress Notes (Addendum)
  Progress Note   Patient: Melanie Gray ZOX:[id number] DOB: Sep 22, 1940 DOA: 12/07/2022     4 DOS: the patient was seen and examined on 12/11/2022   Brief hospital course: Melanie Gray is a 82 y.o. female with past medical history of Medical consultation for medical management on 81 year old female who is s/p emergent laparotomy for small bowel obstruction. Past medical history significant for DM, HTN, CKD 3A, HLD, depression, urge incontinence and osteoarthritis. Patient underwent partial small bowel resection on 5/27, patient developed post op ileus, was started on NG suction.  She is also started on TPN.   Principal Problem:   Small bowel obstruction (HCC) Active Problems:   S/P laparotomy   Sinus bradycardia   Benign hypertension   Chronic anemia   Chronic kidney disease, stage 3a (HCC)   Diabetes mellitus with renal manifestation (HCC)   Dyslipidemia   SBO (small bowel obstruction) (HCC)   Malnutrition of moderate degree   Hypernatremia   Hypophosphatemia   Assessment and Plan:  Small bowel obstruction (HCC) S/p laparotomy 12/08/2022 Postop ileus. Lactic acidosis. Bowel ischemia status post bowel resection. Patient had a small bowel resection 2 days ago, as expected, patient bowel has not recovered yet.  Patient was started on TPN after the PICC line was placed.   Patient had some abdominal cramping today, positive bowel sounds.  Still has not have passed gas, no bowel movement.  Continue to follow. Patient had a significant lactic acidosis of 4.7, this appears to be secondary to small bowel ischemia which she was identified from surgery, the bowel has been resected.  Hypernatremia Hypophosphatemia. Chronic kidney disease stage IIIa. Adjustments through TPN.  Renal function improved.  Acute blood loss anemia. Hemoglobin dropped down to 6.5, general surgery ordered 2 units of PRBC, ordered 20 mg IV Lasix posttransfusion. No B12 deficiency, hemoglobin stable.  Sinus  bradycardia Stable.   Diabetes mellitus with renal manifestation (HCC) Sliding scale coverage while n.p.o.     Benign hypertension Hydralazine IV as needed n.p.o.     Subjective:  Patient had some abdominal cramping required pain medicine.  Otherwise doing well.  Physical Exam: Vitals:   12/10/22 1610 12/10/22 2022 12/11/22 0714 12/11/22 0750  BP: (!) 144/67 (!) 142/78  133/64  Pulse: 71 74  67  Resp: 18 18  18   Temp: 98.8 F (37.1 C) 99.1 F (37.3 C)  98.2 F (36.8 C)  TempSrc:  Oral  Oral  SpO2: 100% 100%    Weight:   60.3 kg   Height:       General exam: Appears calm and comfortable  Respiratory system: Clear to auscultation. Respiratory effort normal. Cardiovascular system: S1 & S2 heard, RRR. No JVD, murmurs, rubs, gallops or clicks. No pedal edema. Gastrointestinal system: Abdomen is nondistended, soft and nontender. No organomegaly or masses felt. Normal bowel sounds heard. Central nervous system: Alert and oriented. No focal neurological deficits. Extremities: Symmetric 5 x 5 power. Skin: No rashes, lesions or ulcers Psychiatry: Judgement and insight appear normal. Mood & affect appropriate.    Data Reviewed:  Lab results reviewed.  Family Communication: None  Disposition: Status is: Inpatient Remains inpatient appropriate because: Disease, IV treatment.     Time spent: 35 minutes  Author: Marrion Coy, MD 12/11/2022 1:19 PM  For on call review [email or web address].

## 2022-12-11 NOTE — Progress Notes (Signed)
[facility] SURGICAL PROGRESS NOTE  Hospital Day(s): 4.   Post op day(s): 4 Days Post-Op.   Interval History:  Patient seen and examined No acute events or new complaints overnight.  Patient reports she feels a little better this AM Was able to sit in chair yesterday which helped Sore throat improving No fever, chills, nausea, emesis  Slight bump in WBC; 12.6K Hgb improved to 9.3; s/p 2 units pRBCS on 05/29 Renal function normalized; sCr - 0.89; UO - 3550 ccs Hypophosphatemia to 2.2 NGT in place; 410 ccs out No reports of flatus  Vital signs in last 24 hours: [min-max] current  Temp:  [97.8 F (36.6 C)-99.1 F (37.3 C)] 98.2 F (36.8 C) (05/30 0750) Pulse Rate:  [67-74] 67 (05/30 0750) Resp:  [16-20] 18 (05/30 0750) BP: (133-144)/(51-78) 133/64 (05/30 0750) SpO2:  [100 %] 100 % (05/29 2022)     Height: 5\' 5"  (165.1 cm) Weight: 64.6 kg BMI (Calculated): 23.7   Intake/Output last 2 shifts:  05/29 0701 - 05/30 0700 In: 3103.9 [I.V.:2433.9; Blood:670] Out: 3960 [Urine:3550; Emesis/NG output:410]   Physical Exam:  Constitutional: alert, cooperative and no distress  HEENT: NGT in place; output clearing Respiratory: breathing non-labored at rest  Cardiovascular: regular rate and sinus rhythm  Gastrointestinal: Soft, incisional soreness, and non-distended. No rebound/guarding Genitourinary: Foley catheter in place Integumentary: Laparotomy is intact with staples; no erythema; no drainage   Labs:     Latest Ref Rng & Units 12/11/2022    5:12 AM 12/10/2022    4:53 AM 12/09/2022    6:21 AM  CBC  WBC 4.0 - 10.5 K/uL 12.6  11.5  10.4   Hemoglobin 12.0 - 15.0 g/dL 9.3  6.4  7.3   Hematocrit 36.0 - 46.0 % 27.3  19.5  22.1   Platelets 150 - 400 K/uL 159  160  169       Latest Ref Rng & Units 12/11/2022    5:12 AM 12/10/2022    4:53 AM 12/09/2022    6:21 AM  CMP  Glucose 70 - 99 mg/dL 161  096  045   BUN 8 - 23 mg/dL 29  29  26    Creatinine 0.44 - 1.00 mg/dL  4.09  8.11  9.14   Sodium 135 - 145 mmol/L 146  147  144   Potassium 3.5 - 5.1 mmol/L 3.5  4.2  3.9   Chloride 98 - 111 mmol/L 115  120  116   CO2 22 - 32 mmol/L 24  23  22    Calcium 8.9 - 10.3 mg/dL 8.3  8.2  7.7   Total Protein 6.5 - 8.1 g/dL 5.4     Total Bilirubin 0.3 - 1.2 mg/dL 0.8     Alkaline Phos 38 - 126 U/L 49     AST 15 - 41 U/L 48     ALT 0 - 44 U/L 23        Imaging studies: No new pertinent imaging studies   Assessment/Plan:  82 y.o. female 4 Days Post-Op s/p exploratory laparotomy and small bowel resection (120 cms) secondary to internal hernia   - Continue TPN; advance to goal  - Continue NGT decompression; LIS; monitor and record output  - Continue NPO + IVF support - Discontinue foley catheter today - Monitor abdominal examination; on-going bowel function  - Serial KUB as needed - Pain control prn; antiemetics prn - Monitor H&H; improved s/p transfusion  - Monitor leukocytosis   - Mobilize; PT -  needs to walk today   All of the above findings and recommendations were discussed with the patient, and the medical team, and all of patient's questions were answered to her expressed satisfaction.  -- Lynden Oxford, PA-C [facility] 12/11/2022, 9:18 AM M-F: 7am - 4pm

## 2022-12-11 NOTE — Progress Notes (Signed)
PHARMACY - TOTAL PARENTERAL NUTRITION CONSULT NOTE   Indication: Small bowel obstruction  Patient Measurements: Height: 5\' 5"  (165.1 cm) Weight: 64.6 kg (142 lb 6.7 oz) IBW/kg (Calculated) : 57 TPN AdjBW (KG): 62 Body mass index is 23.7 kg/m.  Assessment: 82 y/o female with h/o HTN, CKD III, DM, HLD, MDD, MS and a past hernia repair who is admitted with SBO and ischemic changes secondary to internal hernia now s/p exploratory laparotomy with reduction of internal hernia and small bowel resection (~120cms) with primary side to side functional end to end anastomosis 5/27.   Glucose / Insulin: BG 119 - 206 / previous 24h requiring 5 units SSI Electrolytes: slight hypernatremia, hypophosphatemia Renal: SCr~1.1 Hepatic: LFTs, bilirubin wnl ab baseline Intake / Output; MIVF: NaCl 0.9% w/ 20 mEq KCl/L GI Imaging: No new pertinent imaging studies  GI Surgeries / Procedures:  05/27 exploratory laparotomy and small bowel resection   Central access: 12/09/22 (pending) TPN start date: 12/09/22  Nutritional Goals: Goal TPN rate is 60 mL/hr (provides 85 g of protein and 1603 kcals per day)  RD Assessment:  Estimated Needs Total Energy Estimated Needs: 1500-1700kcal/day Total Protein Estimated Needs: 75-85g/day Total Fluid Estimated Needs: 1.5-1.7L/day  Current Nutrition:  NPO  Plan:  ---Advance TPN to 60 mL/hr  ---Electrolytes in TPN: Na 38mEq/L, K 2mEq/L, Ca 97mEq/L, Mg 74mEq/L, and Phos 35mmol/L. Cl:Ac 1:1 ---Add standard MVI, thiamine 100 mg and trace elements to TPN ---Continue Sensitive q6h SSI and adjust as needed  ---Monitor TPN labs on Mon/Thurs, daily until stable   Elliot Gurney, PharmD, BCPS Clinical Pharmacist  12/11/2022 7:43 AM

## 2022-12-12 DIAGNOSIS — N1831 Chronic kidney disease, stage 3a: Secondary | ICD-10-CM | POA: Diagnosis not present

## 2022-12-12 DIAGNOSIS — E876 Hypokalemia: Secondary | ICD-10-CM | POA: Insufficient documentation

## 2022-12-12 DIAGNOSIS — K56609 Unspecified intestinal obstruction, unspecified as to partial versus complete obstruction: Secondary | ICD-10-CM | POA: Diagnosis not present

## 2022-12-12 DIAGNOSIS — D62 Acute posthemorrhagic anemia: Secondary | ICD-10-CM

## 2022-12-12 LAB — CBC
HCT: 26 % — ABNORMAL LOW (ref 36.0–46.0)
Hemoglobin: 8.7 g/dL — ABNORMAL LOW (ref 12.0–15.0)
MCH: 29 pg (ref 26.0–34.0)
MCHC: 33.5 g/dL (ref 30.0–36.0)
MCV: 86.7 fL (ref 80.0–100.0)
Platelets: 170 10*3/uL (ref 150–400)
RBC: 3 MIL/uL — ABNORMAL LOW (ref 3.87–5.11)
RDW: 17.6 % — ABNORMAL HIGH (ref 11.5–15.5)
WBC: 9.4 10*3/uL (ref 4.0–10.5)
nRBC: 0 % (ref 0.0–0.2)

## 2022-12-12 LAB — GLUCOSE, CAPILLARY
Glucose-Capillary: 125 mg/dL — ABNORMAL HIGH (ref 70–99)
Glucose-Capillary: 131 mg/dL — ABNORMAL HIGH (ref 70–99)
Glucose-Capillary: 134 mg/dL — ABNORMAL HIGH (ref 70–99)
Glucose-Capillary: 136 mg/dL — ABNORMAL HIGH (ref 70–99)
Glucose-Capillary: 155 mg/dL — ABNORMAL HIGH (ref 70–99)

## 2022-12-12 LAB — BASIC METABOLIC PANEL
Anion gap: 4 — ABNORMAL LOW (ref 5–15)
BUN: 27 mg/dL — ABNORMAL HIGH (ref 8–23)
CO2: 25 mmol/L (ref 22–32)
Calcium: 8.5 mg/dL — ABNORMAL LOW (ref 8.9–10.3)
Chloride: 116 mmol/L — ABNORMAL HIGH (ref 98–111)
Creatinine, Ser: 0.76 mg/dL (ref 0.44–1.00)
GFR, Estimated: >60 mL/min (ref >60)
Glucose, Bld: 147 mg/dL — ABNORMAL HIGH (ref 70–99)
Potassium: 3.2 mmol/L — ABNORMAL LOW (ref 3.5–5.1)
Sodium: 145 mmol/L (ref 135–145)

## 2022-12-12 LAB — SURGICAL PATHOLOGY

## 2022-12-12 LAB — PHOSPHORUS: Phosphorus: 3.4 mg/dL (ref 2.5–4.6)

## 2022-12-12 LAB — MAGNESIUM: Magnesium: 2.2 mg/dL (ref 1.7–2.4)

## 2022-12-12 LAB — AEROBIC/ANAEROBIC CULTURE W GRAM STAIN (SURGICAL/DEEP WOUND)

## 2022-12-12 MED ORDER — BOOST / RESOURCE BREEZE PO LIQD CUSTOM
1.0000 | Freq: Three times a day (TID) | ORAL | Status: DC
  Administered 2022-12-12 – 2022-12-13 (×2): 1 via ORAL

## 2022-12-12 MED ORDER — TRACE MINERALS CU-MN-SE-ZN 300-55-60-3000 MCG/ML IV SOLN
INTRAVENOUS | Status: AC
  Filled 2022-12-12: qty 566.4

## 2022-12-12 MED ORDER — POTASSIUM CHLORIDE 10 MEQ/100ML IV SOLN
10.0000 meq | INTRAVENOUS | Status: AC
  Administered 2022-12-12 (×3): 10 meq via INTRAVENOUS
  Filled 2022-12-12: qty 100

## 2022-12-12 MED ORDER — ENOXAPARIN SODIUM 40 MG/0.4ML IJ SOSY
40.0000 mg | PREFILLED_SYRINGE | INTRAMUSCULAR | Status: DC
  Administered 2022-12-12 – 2022-12-16 (×5): 40 mg via SUBCUTANEOUS
  Filled 2022-12-12 (×5): qty 0.4

## 2022-12-12 NOTE — TOC Initial Note (Signed)
Transition of Care [facility]) - Initial/Assessment Note    Patient Details  Name: Melanie Gray MRN: [id number] Date of Birth: 03/31/1941  Transition of Care [facility]) CM/SW Contact:    Melanie Fitch, RN Phone Number: 12/12/2022, 4:38 PM  Clinical Narrative:                  Admitted for: SBO, stil requiring TPN with plans to wean Admitted from: Home with son Daughter Melanie Gray at bedside PCP: Melanie Gray  Current home health/prior home health/DME: cane  Therapy recommending SNF.  Patient defers discussion to daughter.  Daughter states they would prefer for patient to return home with home health services.  Daughter states that patient's son will be in the home with her. Daughter states that she lives in [place] but that she will be coming to stay with the patient has well  Daughter states she does not have a preference of home health agency.  Referral made and accepted by Melanie Gray with [facility]  Patient will need a rw closer to discharge    Expected Discharge Plan: Home w Home Health Services Barriers to Discharge: Continued Medical Work up   Patient Goals and CMS Choice     Choice offered to / list presented to : Patient, Adult Children      Expected Discharge Plan and Services     Post Acute Care Choice: Home Health                                        Prior Living Arrangements/Services                  Current home services: DME    Activities of Daily Living Home Assistive Devices/Equipment: Other (Comment) (cane) ADL Screening (condition at time of admission) Patient's cognitive ability adequate to safely complete daily activities?: Yes Is the patient deaf or have difficulty hearing?: No Does the patient have difficulty seeing, even when wearing glasses/contacts?: No Does the patient have difficulty concentrating, remembering, or making decisions?: No Patient able to express need for assistance with ADLs?: Yes Does the patient have difficulty  dressing or bathing?: No Independently performs ADLs?: Yes (appropriate for developmental age) Does the patient have difficulty walking or climbing stairs?: No Weakness of Legs: Both Weakness of Arms/Hands: None  Permission Sought/Granted                  Emotional Assessment              Admission diagnosis:  Small bowel obstruction (HCC) [K56.609] SBO (small bowel obstruction) (HCC) [K56.609] Patient Active Problem List   Diagnosis Date Noted   Hypokalemia 12/12/2022   Lactic acidosis 12/11/2022   Acute blood loss anemia 12/11/2022   Small bowel ischemia (HCC) 12/11/2022   Malnutrition of moderate degree 12/10/2022   Hypernatremia 12/10/2022   Hypophosphatemia 12/10/2022   SBO (small bowel obstruction) (HCC) 12/08/2022   S/P laparotomy 12/08/2022   Sinus bradycardia 12/08/2022   Small bowel obstruction (HCC) 12/07/2022   Pain due to onychomycosis of toenails of both feet 08/30/2020   Injury of toenail of right foot 08/30/2020   Edema of lower extremity 12/05/2019   Depression, major, recurrent, in remission (HCC) 08/10/2017   Varicose veins of both lower extremities with inflammation 12/01/2016   Chronic venous insufficiency 12/01/2016   Benign hypertension 11/06/2014   Chronic anemia 11/06/2014   Seborrheic dermatitis 11/06/2014  Diabetes mellitus with renal manifestation (HCC) 11/06/2014   Dyslipidemia 11/06/2014   Cephalalgia 11/06/2014   Deafness, sensorineural 11/06/2014   Arthritis of knee, degenerative 11/06/2014   Panic attack 11/06/2014   Restless legs syndrome 11/06/2014   Allergic rhinitis 11/06/2014   Spinal stenosis 11/06/2014   Osteopenia 05/09/2009   Chronic kidney disease, stage 3a (HCC) 04/20/2008   PCP:  Melanie Cory, MD Pharmacy:   [facility] [address] ([address]), [place] - [address] [place] ([place]) [place] [address] Phone: [phone] Fax: [phone]  [id number] [place],   - [address] [place] [address] Phone: [phone] Fax: [phone]     Social Determinants of Health (SDOH) Social History: SDOH Screenings   Food Insecurity: No Food Insecurity (12/10/2022)  Housing: Low Risk  (12/10/2022)  Transportation Needs: No Transportation Needs (12/10/2022)  Utilities: Not At Risk (12/10/2022)  Alcohol Screen: Low Risk  (12/24/2021)  Depression (PHQ2-9): Low Risk  (08/29/2022)  Financial Resource Strain: Low Risk  (12/24/2021)  Physical Activity: Inactive (12/24/2021)  Social Connections: Socially Isolated (12/24/2021)  Stress: No Stress Concern Present (12/24/2021)  Tobacco Use: Low Risk  (12/08/2022)   SDOH Interventions:     Readmission Risk Interventions     No data to display

## 2022-12-12 NOTE — Care Management Important Message (Signed)
Important Message  Patient Details  Name: Melanie Gray MRN: [id number] Date of Birth: 10/18/1940   Medicare Important Message Given:  Yes     [name] 12/12/2022, 11:01 AM

## 2022-12-12 NOTE — Progress Notes (Signed)
Less than 20cc residual after clamping NGT since 8a.m. NGT removed without complications.  Cornell Barman Elizandro Laura

## 2022-12-12 NOTE — Progress Notes (Signed)
PHARMACY - TOTAL PARENTERAL NUTRITION CONSULT NOTE   Indication: Small bowel obstruction  Patient Measurements: Height: 5\' 5"  (165.1 cm) Weight: 65 kg (143 lb 4.8 oz) IBW/kg (Calculated) : 57 TPN AdjBW (KG): 62 Body mass index is 23.85 kg/m.  Assessment: 82 y/o female with h/o HTN, CKD III, DM, HLD, MDD, MS and a past hernia repair who is admitted with SBO and ischemic changes secondary to internal hernia now s/p exploratory laparotomy with reduction of internal hernia and small bowel resection (~120cms) with primary side to side functional end to end anastomosis 5/27.   Glucose / Insulin: BG 119 - 206 / previous 24h requiring 5 units SSI Electrolytes: slight hypernatremia, hypophosphatemia Renal: SCr~1.1 Hepatic: LFTs, bilirubin wnl ab baseline Intake / Output; MIVF: NaCl 0.9% w/ 20 mEq KCl/L GI Imaging: No new pertinent imaging studies  GI Surgeries / Procedures:  05/27 exploratory laparotomy and small bowel resection   Central access: 12/09/22 (pending) TPN start date: 12/09/22  Nutritional Goals: Goal TPN rate is 60 mL/hr (provides 85 g of protein and 1603 kcals per day)  RD Assessment:  Estimated Needs Total Energy Estimated Needs: 1500-1700kcal/day Total Protein Estimated Needs: 75-85g/day Total Fluid Estimated Needs: 1.5-1.7L/day  Current Nutrition:  NPO  Plan:  ---Advance TPN to 60 mL/hr  --K+ 3.2. Will order Kcl IV x 3 runs.  ---Electrolytes in TPN: Na 87mEq/L, K 50mEq/L, Ca 48mEq/L, Mg 46mEq/L, and Phos 17mmol/L. Cl:Ac 1:1 ---Add standard MVI, thiamine 100 mg and trace elements to TPN ---Continue Sensitive q6h SSI and adjust as needed  ---Monitor TPN labs on Mon/Thurs, daily until stable   Elliot Gurney, PharmD, BCPS Clinical Pharmacist  12/12/2022 7:30 AM

## 2022-12-12 NOTE — Progress Notes (Signed)
[facility] SURGICAL PROGRESS NOTE  Hospital Day(s): 5.   Post op day(s): 5 Days Post-Op.   Interval History:  Patient seen and examined No acute events or new complaints overnight.  Patient reports she is feeling better this morning; biggest issue is sore throat Abdominal soreness No fever, chills, nausea, emesis  Leukocytosis resolved; now 9.4K Hgb to 8.7; stable Renal function normal; sCr - 0.76; UO - 600 ccs + unmeasured Hypokalemia to 3.2 NGT in place; 0 ccs out recorded She did have two bowel movements last night; these were maroon colored Ambulated in room with PT  Vital signs in last 24 hours: [min-max] current  Temp:  [98.1 F (36.7 C)-98.3 F (36.8 C)] 98.1 F (36.7 C) (05/31 0349) Pulse Rate:  [67-81] 73 (05/31 0349) Resp:  [18] 18 (05/31 0349) BP: (130-150)/(63-73) 144/63 (05/31 0349) SpO2:  [96 %-100 %] 96 % (05/31 0349) Weight:  [65 kg] 65 kg (05/31 0349)     Height: 5\' 5"  (165.1 cm) Weight: 65 kg BMI (Calculated): 23.85   Intake/Output last 2 shifts:  05/30 0701 - 05/31 0700 In: 120 [P.O.:120] Out: 600 [Urine:600]   Physical Exam:  Constitutional: alert, cooperative and no distress  HEENT: NGT in place; output clearing Respiratory: breathing non-labored at rest  Cardiovascular: regular rate and sinus rhythm  Gastrointestinal: Soft, incisional soreness, and non-distended. No rebound/guarding Genitourinary: Foley catheter in place Integumentary: Laparotomy is intact with staples; no erythema; no drainage   Labs:     Latest Ref Rng & Units 12/12/2022    6:55 AM 12/11/2022    5:12 AM 12/10/2022    4:53 AM  CBC  WBC 4.0 - 10.5 K/uL 9.4  12.6  11.5   Hemoglobin 12.0 - 15.0 g/dL 8.7  9.3  6.4   Hematocrit 36.0 - 46.0 % 26.0  27.3  19.5   Platelets 150 - 400 K/uL 170  159  160       Latest Ref Rng & Units 12/12/2022    6:20 AM 12/11/2022    5:12 AM 12/10/2022    4:53 AM  CMP  Glucose 70 - 99 mg/dL 409  811  914   BUN 8 - 23 mg/dL 27   29  29    Creatinine 0.44 - 1.00 mg/dL 7.82  9.56  2.13   Sodium 135 - 145 mmol/L 145  146  147   Potassium 3.5 - 5.1 mmol/L 3.2  3.5  4.2   Chloride 98 - 111 mmol/L 116  115  120   CO2 22 - 32 mmol/L 25  24  23    Calcium 8.9 - 10.3 mg/dL 8.5  8.3  8.2   Total Protein 6.5 - 8.1 g/dL  5.4    Total Bilirubin 0.3 - 1.2 mg/dL  0.8    Alkaline Phos 38 - 126 U/L  49    AST 15 - 41 U/L  48    ALT 0 - 44 U/L  23       Imaging studies: No new pertinent imaging studies   Assessment/Plan:  82 y.o. female 5 Days Post-Op s/p exploratory laparotomy and small bowel resection (120 cms) secondary to internal hernia   - Proceed with NGT clamping trial this morning. NGT clamped at 0800. At 1200, check residuals. If residuals are <150 ccs, we can remove and trial CLD.   - Continue TPN; advance to goal - Continue NPO + IVF support - Monitor abdominal examination; on-going bowel function - Pain control prn; antiemetics prn -  Monitor H&H; stable - suspect maroon stool was residual blood from anastomosis  - Monitor leukocytosis; resolved  - Mobilize; PT - doing better  All of the above findings and recommendations were discussed with the patient, and the medical team, and all of patient's questions were answered to her expressed satisfaction.  -- Lynden Oxford, PA-C [facility] 12/12/2022, 7:40 AM M-F: 7am - 4pm

## 2022-12-12 NOTE — Progress Notes (Signed)
Progress Note   Patient: Melanie Gray WGN:[id number] DOB: 01-03-1941 DOA: 12/07/2022     5 DOS: the patient was seen and examined on 12/12/2022   Brief hospital course: Melanie Gray is a 82 y.o. female with past medical history of Medical consultation for medical management on 82 year old female who is s/p emergent laparotomy for small bowel obstruction. Past medical history significant for DM, HTN, CKD 3A, HLD, depression, urge incontinence and osteoarthritis. Patient underwent partial small bowel resection on 5/27, patient developed post op ileus, was started on NG suction.  She is also started on TPN.   Principal Problem:   Small bowel obstruction (HCC) Active Problems:   S/P laparotomy   Sinus bradycardia   Benign hypertension   Chronic anemia   Chronic kidney disease, stage 3a (HCC)   Diabetes mellitus with renal manifestation (HCC)   Dyslipidemia   SBO (small bowel obstruction) (HCC)   Malnutrition of moderate degree   Hypernatremia   Hypophosphatemia   Lactic acidosis   Acute blood loss anemia   Small bowel ischemia (HCC)   Hypokalemia   Assessment and Plan: Small bowel obstruction (HCC) S/p laparotomy 12/08/2022 Postop ileus. Lactic acidosis. Bowel ischemia status post bowel resection. Patient had a small bowel resection 2 days ago, as expected, patient bowel has not recovered yet.  Patient was started on TPN after the PICC line was placed.   Patient had a significant lactic acidosis of 4.7, this appears to be secondary to small bowel ischemia which she was identified from surgery, the bowel has been resected. Patient had multiple bowel movements which were maroon-colored.  Bowel send has recovered in function, general surgery not concerned about small amount of GI bleeding per rectum. NG tube was clamped, general surgery is advancing diet.  Hypernatremia Hypophosphatemia. Chronic kidney disease stage IIIa. Hypokalemia. Replete potassium again today.   Acute blood  loss anemia. Hemoglobin dropped down to 6.5, general surgery ordered 2 units of PRBC, ordered 20 mg IV Lasix posttransfusion. Hemoglobin has been stable, no iron or B12 deficiency.   Sinus bradycardia Stable.   Diabetes mellitus with renal manifestation (HCC) Sliding scale coverage while n.p.o.     Benign hypertension Hydralazine IV as needed n.p.o.      Subjective:  Patient had multiple bowel movements since yesterday, no nausea vomiting.  No abdominal pain.  No shortness of breath.  Physical Exam: Vitals:   12/11/22 2022 12/11/22 2025 12/12/22 0349 12/12/22 0854  BP: (!) 150/73 130/69 (!) 144/63 (!) 143/65  Pulse: 81 72 73 60  Resp: 18 18 18 16   Temp: 98.3 F (36.8 C) 98.3 F (36.8 C) 98.1 F (36.7 C) 98.2 F (36.8 C)  TempSrc: Oral Oral Oral   SpO2: 100% 98% 96% 100%  Weight:   65 kg   Height:       General exam: Appears calm and comfortable  Respiratory system: Clear to auscultation. Respiratory effort normal. Cardiovascular system: S1 & S2 heard, RRR. No JVD, murmurs, rubs, gallops or clicks. No pedal edema. Gastrointestinal system: Abdomen is nondistended, soft and nontender. No organomegaly or masses felt. Normal bowel sounds heard. Central nervous system: Alert and oriented. No focal neurological deficits. Extremities: Symmetric 5 x 5 power. Skin: No rashes, lesions or ulcers Psychiatry: Judgement and insight appear normal. Mood & affect appropriate.    Data Reviewed:  Lab test results reviewed.  Family Communication: None  Disposition: Status is: Inpatient Remains inpatient appropriate because: Severity of disease, IV treatment.     Time spent: 62  minutes  Author: Marrion Coy, MD 12/12/2022 11:48 AM  For on call review [email or web address].

## 2022-12-12 NOTE — Progress Notes (Signed)
Nutrition Follow Up Note   DOCUMENTATION CODES:   Non-severe (moderate) malnutrition in context of chronic illness  INTERVENTION:   Continue TPN per pharmacy- provides 1603kcal/day and 85g/day protein   Boost Breeze po TID, each supplement provides 250 kcal and 9 grams of protein  Daily weights   NUTRITION DIAGNOSIS:   Moderate Malnutrition related to chronic illness as evidenced by moderate fat depletion, moderate muscle depletion, severe muscle depletion. -ongoing   GOAL:   Patient will meet greater than or equal to 90% of their needs -met   MONITOR:   PO intake, Supplement acceptance, Labs, Weight trends, I & O's, Skin  ASSESSMENT:   82 y/o female with h/o HTN, CKD III, DM, HLD, MDD, MS and a past hernia repair who is admitted with SBO and ischemic changes secondary to internal hernia now s/p exploratory laparotomy with reduction of internal hernia and small bowel resection (88cm of distal jejunum and proximal ileum) with primary side to side functional end to end anastomosis 5/27.  Pt tolerating TPN well at goal rate. Refeed labs improving. Pt reports she is feeling better today. Pt reports incisional soreness but denies any bloating or nausea. Pt did have some maroon colored bowel movements overnight. NGT removed today. Pt initiated on a clear liquid diet. RD will add Boost Breeze. Recommend continue TPN until patient is able to take in sufficient nutrition via oral intake. Per chart, pt appears weight stable since admission.    Medications reviewed and include: lovenox, insulin, protonix, TPN  Labs reviewed: K 3.2(L), BUN 27(H), P 3.4 wnl, Mg 2.2 wnl Hgb 8.7(L), Hct 26.0(L) Cbgs- 136, 134, 125, 131 x 24 hrs   Diet Order:   Diet Order             Diet clear liquid Room service appropriate? Yes; Fluid consistency: Thin  Diet effective now                  EDUCATION NEEDS:   Education needs have been addressed  Skin:  Skin Assessment: Reviewed RN Assessment  (incision abdomen)  Last BM:  5/31- type 7  Height:   Ht Readings from Last 1 Encounters:  12/07/22 5\' 5"  (1.651 m)    Weight:   Wt Readings from Last 1 Encounters:  12/12/22 65 kg    Ideal Body Weight:  56.8 kg  BMI:  Body mass index is 23.85 kg/m.  Estimated Nutritional Needs:   Kcal:  1500-1700kcal/day  Protein:  75-85g/day  Fluid:  1.5-1.7L/day  Betsey Holiday MS, RD, LDN Please refer to [facility] for RD and/or RD on-call/weekend/after hours pager

## 2022-12-12 NOTE — Progress Notes (Signed)
Occupational Therapy Treatment Patient Details Name: Melanie Gray MRN: [id number] DOB: 1941-04-24 Today's Date: 12/12/2022   History of present illness Pt admitted for SBO and is s/p exploratory laparotomy on 12/08/22. History includes DM, HTN, CKD, HLD, depression, and MS.   OT comments  Pt seen for OT tx. Pt in recliner, agreeable to OT session focused on grooming tasks. Pt declined grooming at sink, requesting to remain seated. Endorsed some fatigue after earlier PT session. Pt set up for grooming tasks with intermittent VC to support participation with NG tube in place. Pt progressing towards goals. Continues to benefit from skilled OT.    Recommendations for follow up therapy are one component of a multi-disciplinary discharge planning process, led by the attending physician.  Recommendations may be updated based on patient status, additional functional criteria and insurance authorization.    Assistance Recommended at Discharge Intermittent Supervision/Assistance  Patient can return home with the following  A lot of help with walking and/or transfers;A lot of help with bathing/dressing/bathroom;Assistance with cooking/housework;Help with stairs or ramp for entrance;Assist for transportation   Equipment Recommendations  BSC/3in1    Recommendations for Other Services      Precautions / Restrictions Precautions Precautions: Fall Restrictions Weight Bearing Restrictions: No       Mobility Bed Mobility               General bed mobility comments: NT, in recliner at start and end of session    Transfers                   General transfer comment: declined after recent PT     Balance Overall balance assessment: Needs assistance, History of Falls Sitting-balance support: No upper extremity supported, Feet supported Sitting balance-Leahy Scale: Fair                                     ADL either performed or assessed with clinical judgement   ADL  Overall ADL's : Needs assistance/impaired     Grooming: Sitting;Wash/dry face;Supervision/safety;Set up;Oral care Grooming Details (indicate cue type and reason): PRN VC for sequencing to work around NG tube                                    [facility] Rx Assessment              Vision       Perception     Praxis      Cognition Arousal/Alertness: Awake/alert Behavior During Therapy: WFL for tasks assessed/performed Overall Cognitive Status: Within Functional Limits for tasks assessed                                          Exercises      Shoulder Instructions       General Comments      Pertinent Vitals/ Pain       Pain Assessment Pain Assessment: 0-10 Pain Score: 5  Pain Location: throat and abdomen Pain Descriptors / Indicators: Dull, Discomfort Pain Intervention(s): Limited activity within patient's tolerance, Monitored during session, Repositioned, Patient requesting pain meds-RN notified  Home Living  Prior Functioning/Environment              Frequency  Min 2X/week        Progress Toward Goals  OT Goals(current goals can now be found in the care plan section)  Progress towards OT goals: Progressing toward goals  Acute Rehab OT Goals Patient Stated Goal: to feel better and go home OT Goal Formulation: With patient Time For Goal Achievement: 12/24/22 Potential to Achieve Goals: Good  Plan Discharge plan remains appropriate;Frequency remains appropriate    Co-evaluation                 AM-PAC OT "6 Clicks" Daily Activity     Outcome Measure   Help from another person eating meals?: A Little (functionally. NPO at this time) Help from another person taking care of personal grooming?: A Little Help from another person toileting, which includes using toliet, bedpan, or urinal?: A Lot Help from another person bathing (including washing,  rinsing, drying)?: A Lot Help from another person to put on and taking off regular upper body clothing?: A Little Help from another person to put on and taking off regular lower body clothing?: A Lot 6 Click Score: 15    End of Session    OT Visit Diagnosis: Other abnormalities of gait and mobility (R26.89);Muscle weakness (generalized) (M62.81)   Activity Tolerance Patient tolerated treatment well   Patient Left in chair;with call bell/phone within reach;with chair alarm set   Nurse Communication          Time: 7829-5621 OT Time Calculation (min): 13 min  Charges: OT General Charges $OT Visit: 1 Visit OT Treatments $Self Care/Home Management : 8-22 mins  Arman Filter., MPH, MS, OTR/L ascom [phone] 12/12/22, 11:27 AM

## 2022-12-12 NOTE — Progress Notes (Signed)
Physical Therapy Treatment Patient Details Name: Melanie Gray MRN: [id number] DOB: 01/13/1941 Today's Date: 12/12/2022   History of Present Illness Pt admitted for SBO and is s/p exploratory laparotomy on 12/08/22. History includes DM, HTN, CKD, HLD, depression, and MS.    PT Comments     Entered room to find patient sitting up in bed and NG clamped by nursing. Patient was ready to walk and needed min assist from SPT for sitting EOB. Needed B/L hand hold on bed rails for sitting up to EOB. Min assist from SPT with a moderately elevated bed for sit to stand. Patient ambulated 55 feet with min guard into the hallway before returning to chair due to fatigue. Patient reports 4/10 on NPS pain scale for her abdomen after ambulation. Patient left sitting in chair with chair alarm on, call bell and phone. PT retrieved new ice chips for the patients per dietary orders.    Recommendations for follow up therapy are one component of a multi-disciplinary discharge planning process, led by the attending physician.  Recommendations may be updated based on patient status, additional functional criteria and insurance authorization.  Follow Up Recommendations  Can patient physically be transported by private vehicle: Yes    Assistance Recommended at Discharge Intermittent Supervision/Assistance  Patient can return home with the following A lot of help with walking and/or transfers;Assist for transportation;Help with stairs or ramp for entrance;A lot of help with bathing/dressing/bathroom   Equipment Recommendations  Rolling walker (2 wheels)    Recommendations for Other Services       Precautions / Restrictions Precautions Precautions: Fall Restrictions Weight Bearing Restrictions: No     Mobility  Bed Mobility Overal bed mobility: Needs Assistance Bed Mobility: Supine to Sit     Supine to sit: Min assist     General bed mobility comments: Needs min assist coming to EOB today with both hands on  the bed rail for assistance. Scooted EOB independently with CGA.    Transfers Overall transfer level: Needs assistance Equipment used: Rolling walker (2 wheels) Transfers: Sit to/from Stand Sit to Stand: Min assist           General transfer comment: Min assistance needed for sit to stand from moderately elevated bed with RW. 2+ mod assist needed to stand from low bed wwith RW. Cueing needed for hand placement during sit to stand. Continued flexed trunk in dynami/static stance.    Ambulation/Gait Ambulation/Gait assistance: Min guard Gait Distance (Feet): 55 Feet Assistive device: Rolling walker (2 wheels) Gait Pattern/deviations: Step-through pattern, Decreased step length - right, Decreased step length - left, Decreased stance time - right, Decreased stance time - left, Decreased stride length, Shuffle, Trunk flexed, Narrow base of support       General Gait Details: ambulated 49ft into the hallway and back to her chair with min guard. 1 rest break required during ambulation.Demonstrated decreased gait speed and trunk flexion. No reports of patients knee giving out today.   Stairs             Wheelchair Mobility    Modified Rankin (Stroke Patients Only)       Balance Overall balance assessment: Needs assistance, History of Falls Sitting-balance support: Bilateral upper extremity supported, Feet supported Sitting balance-Leahy Scale: Fair Sitting balance - Comments: steady sitting, reaching inside BOS. Fatigues quickly   Standing balance support: Bilateral upper extremity supported, Reliant on assistive device for balance, During functional activity Standing balance-Leahy Scale: Fair  Cognition Arousal/Alertness: Awake/alert Behavior During Therapy: WFL for tasks assessed/performed Overall Cognitive Status: Within Functional Limits for tasks assessed                                 General Comments:  pleasant and agreeable to session        Exercises      General Comments        Pertinent Vitals/Pain Pain Assessment Pain Assessment: 0-10 Pain Score: 4  Pain Location: Throat and abdomen Pain Descriptors / Indicators: Dull, Discomfort Pain Intervention(s): Limited activity within patient's tolerance, Monitored during session, Repositioned    Home Living                          Prior Function            PT Goals (current goals can now be found in the care plan section) Acute Rehab PT Goals Patient Stated Goal: to get stronger PT Goal Formulation: With patient Time For Goal Achievement: 12/23/22 Potential to Achieve Goals: Good Progress towards PT goals: Progressing toward goals    Frequency    Min 3X/week      PT Plan Current plan remains appropriate    Co-evaluation              AM-PAC PT "6 Clicks" Mobility   Outcome Measure  Help needed turning from your back to your side while in a flat bed without using bedrails?: A Lot Help needed moving from lying on your back to sitting on the side of a flat bed without using bedrails?: A Lot Help needed moving to and from a bed to a chair (including a wheelchair)?: A Lot Help needed standing up from a chair using your arms (e.g., wheelchair or bedside chair)?: A Lot Help needed to walk in hospital room?: A Little Help needed climbing 3-5 steps with a railing? : A Lot 6 Click Score: 13    End of Session   Activity Tolerance: Patient tolerated treatment well Patient left: in chair;with chair alarm set;with call bell/phone within reach Nurse Communication: Mobility status PT Visit Diagnosis: Unsteadiness on feet (R26.81);Repeated falls (R29.6);Muscle weakness (generalized) (M62.81);History of falling (Z91.81);Difficulty in walking, not elsewhere classified (R26.2);Pain Pain - Right/Left:  (abdominal) Pain - part of body:  (abdomen)     Time: 1610-9604 PT Time Calculation (min) (ACUTE ONLY):  19 min  Charges:                        Malachi Carl, SPT    Malachi Carl 12/12/2022, 10:13 AM

## 2022-12-13 DIAGNOSIS — K56609 Unspecified intestinal obstruction, unspecified as to partial versus complete obstruction: Secondary | ICD-10-CM | POA: Diagnosis not present

## 2022-12-13 DIAGNOSIS — D62 Acute posthemorrhagic anemia: Secondary | ICD-10-CM | POA: Diagnosis not present

## 2022-12-13 LAB — CBC
HCT: 24.3 % — ABNORMAL LOW (ref 36.0–46.0)
Hemoglobin: 8.2 g/dL — ABNORMAL LOW (ref 12.0–15.0)
MCH: 29.3 pg (ref 26.0–34.0)
MCHC: 33.7 g/dL (ref 30.0–36.0)
MCV: 86.8 fL (ref 80.0–100.0)
Platelets: 170 10*3/uL (ref 150–400)
RBC: 2.8 MIL/uL — ABNORMAL LOW (ref 3.87–5.11)
RDW: 17.5 % — ABNORMAL HIGH (ref 11.5–15.5)
WBC: 7.9 10*3/uL (ref 4.0–10.5)
nRBC: 0 % (ref 0.0–0.2)

## 2022-12-13 LAB — GLUCOSE, CAPILLARY
Glucose-Capillary: 116 mg/dL — ABNORMAL HIGH (ref 70–99)
Glucose-Capillary: 128 mg/dL — ABNORMAL HIGH (ref 70–99)
Glucose-Capillary: 133 mg/dL — ABNORMAL HIGH (ref 70–99)
Glucose-Capillary: 134 mg/dL — ABNORMAL HIGH (ref 70–99)
Glucose-Capillary: 140 mg/dL — ABNORMAL HIGH (ref 70–99)

## 2022-12-13 LAB — PHOSPHORUS: Phosphorus: 4.4 mg/dL (ref 2.5–4.6)

## 2022-12-13 LAB — BASIC METABOLIC PANEL
Anion gap: 8 (ref 5–15)
BUN: 31 mg/dL — ABNORMAL HIGH (ref 8–23)
CO2: 24 mmol/L (ref 22–32)
Calcium: 8.3 mg/dL — ABNORMAL LOW (ref 8.9–10.3)
Chloride: 111 mmol/L (ref 98–111)
Creatinine, Ser: 0.74 mg/dL (ref 0.44–1.00)
GFR, Estimated: >60 mL/min (ref >60)
Glucose, Bld: 122 mg/dL — ABNORMAL HIGH (ref 70–99)
Potassium: 3.7 mmol/L (ref 3.5–5.1)
Sodium: 143 mmol/L (ref 135–145)

## 2022-12-13 LAB — MAGNESIUM: Magnesium: 2.2 mg/dL (ref 1.7–2.4)

## 2022-12-13 MED ORDER — TRACE MINERALS CU-MN-SE-ZN 300-55-60-3000 MCG/ML IV SOLN
INTRAVENOUS | Status: AC
  Filled 2022-12-13: qty 566.4

## 2022-12-13 NOTE — Progress Notes (Signed)
Progress Note   Patient: Melanie Gray ZOX:[id number] DOB: 1941-06-09 DOA: 12/07/2022     6 DOS: the patient was seen and examined on 12/13/2022   Brief hospital course: Melanie Gray is a 82 y.o. female with past medical history of Medical consultation for medical management on 82 year old female who is s/p emergent laparotomy for small bowel obstruction. Past medical history significant for DM, HTN, CKD 3A, HLD, depression, urge incontinence and osteoarthritis. Patient underwent partial small bowel resection on 5/27, patient developed post op ileus, was started on NG suction.  She is also started on TPN. Patient started having bowel movement 5/31, liquid diet started.  NG tube removed.   Principal Problem:   Small bowel obstruction (HCC) Active Problems:   S/P laparotomy   Sinus bradycardia   Benign hypertension   Chronic anemia   Chronic kidney disease, stage 3a (HCC)   Diabetes mellitus with renal manifestation (HCC)   Dyslipidemia   SBO (small bowel obstruction) (HCC)   Malnutrition of moderate degree   Hypernatremia   Hypophosphatemia   Lactic acidosis   Acute blood loss anemia   Small bowel ischemia (HCC)   Hypokalemia   Assessment and Plan: Small bowel obstruction (HCC) S/p laparotomy 12/08/2022 Postop ileus. Lactic acidosis. Bowel ischemia status post bowel resection. Patient had a small bowel resection 2 days ago, as expected, patient bowel has not recovered yet.  Patient was started on TPN after the PICC line was placed.   Patient had a significant lactic acidosis of 4.7, this appears to be secondary to small bowel ischemia which she was identified from surgery, the bowel has been resected. Patient bowel has recovered, tolerating liquid diet.  Followed by general surgery, still wanted TPN and continue on liquid diet.   Hypernatremia Hypophosphatemia. Chronic kidney disease stage IIIa. Hypokalemia. Condition all improved.   Acute blood loss anemia. Hemoglobin dropped  down to 6.5, general surgery ordered 2 units of PRBC, ordered 20 mg IV Lasix posttransfusion. Hemoglobin has been stable, no iron or B12 deficiency. Hemoglobin has been stable.  Sinus bradycardia Stable.   Diabetes mellitus with renal manifestation (HCC) Continue sliding scale insulin.     Benign hypertension Hydralazine IV as needed.        Subjective:  Patient continued to have bowel movements, tolerating liquid diet without nausea or vomiting.  No abdominal pain.  Physical Exam: Vitals:   12/12/22 2033 12/13/22 0431 12/13/22 0525 12/13/22 0858  BP: 130/77  (!) 144/72 (!) 128/58  Pulse: 81  76 70  Resp: 18  18 18   Temp: 98.4 F (36.9 C)  98.9 F (37.2 C) 98.2 F (36.8 C)  TempSrc:      SpO2: 100%  100% 100%  Weight:  64.5 kg    Height:       General exam: Appears calm and comfortable  Respiratory system: Clear to auscultation. Respiratory effort normal. Cardiovascular system: S1 & S2 heard, RRR. No JVD, murmurs, rubs, gallops or clicks. No pedal edema. Gastrointestinal system: Abdomen is nondistended, soft and nontender. No organomegaly or masses felt. Normal bowel sounds heard. Central nervous system: Alert and oriented x3. No focal neurological deficits. Extremities: Symmetric 5 x 5 power. Skin: No rashes, lesions or ulcers Psychiatry: Judgement and insight appear normal. Mood & affect appropriate.    Data Reviewed:  Lab results reviewed.  Family Communication: None  Disposition: Status is: Inpatient Remains inpatient appropriate because: Severity of disease, IV treatment.     Time spent: 35 minutes  Author: Marrion Coy, MD  12/13/2022 12:43 PM  For on call review [email or web address].

## 2022-12-13 NOTE — Progress Notes (Signed)
[facility] SURGICAL PROGRESS NOTE  Hospital Day(s): 6.   Post op day(s): 6 Days Post-Op.   Interval History:  Patient seen and examined No acute events or new complaints overnight.  Patient reports she is feeling her bowels move into her depends. Abdominal soreness No fever, chills, nausea, emesis  Leukocytosis resolved; Hgb to 8.2; stable, this morning despite maroon stool this morning Renal function normal; sCr - 0.74 Hypokalemia to 3.7 Ambulated in room with PT  Vital signs in last 24 hours: [min-max] current  Temp:  [98.2 F (36.8 C)-98.9 F (37.2 C)] 98.9 F (37.2 C) (06/01 0525) Pulse Rate:  [60-84] 76 (06/01 0525) Resp:  [16-18] 18 (06/01 0525) BP: (125-144)/(65-77) 144/72 (06/01 0525) SpO2:  [100 %] 100 % (06/01 0525) Weight:  [64.5 kg] 64.5 kg (06/01 0431)     Height: 5\' 5"  (165.1 cm) Weight: 64.5 kg BMI (Calculated): 23.66   Intake/Output last 2 shifts:  05/31 0701 - 06/01 0700 In: 1080 [P.O.:1080] Out: 200 [Urine:200]   Physical Exam:  Constitutional: alert, cooperative and no distress  HEENT: NGT in place; output clearing Respiratory: breathing non-labored at rest  Cardiovascular: regular rate and sinus rhythm  Gastrointestinal: Soft, incisional soreness, and non-distended. No rebound/guarding Genitourinary: Foley catheter in place Integumentary: Laparotomy is intact with staples; no erythema; no drainage   Labs:     Latest Ref Rng & Units 12/13/2022    4:25 AM 12/12/2022    6:55 AM 12/11/2022    5:12 AM  CBC  WBC 4.0 - 10.5 K/uL 7.9  9.4  12.6   Hemoglobin 12.0 - 15.0 g/dL 8.2  8.7  9.3   Hematocrit 36.0 - 46.0 % 24.3  26.0  27.3   Platelets 150 - 400 K/uL 170  170  159       Latest Ref Rng & Units 12/13/2022    4:25 AM 12/12/2022    6:20 AM 12/11/2022    5:12 AM  CMP  Glucose 70 - 99 mg/dL 119  147  829   BUN 8 - 23 mg/dL 31  27  29    Creatinine 0.44 - 1.00 mg/dL 5.62  1.30  8.65   Sodium 135 - 145 mmol/L 143  145  146    Potassium 3.5 - 5.1 mmol/L 3.7  3.2  3.5   Chloride 98 - 111 mmol/L 111  116  115   CO2 22 - 32 mmol/L 24  25  24    Calcium 8.9 - 10.3 mg/dL 8.3  8.5  8.3   Total Protein 6.5 - 8.1 g/dL   5.4   Total Bilirubin 0.3 - 1.2 mg/dL   0.8   Alkaline Phos 38 - 126 U/L   49   AST 15 - 41 U/L   48   ALT 0 - 44 U/L   23      Imaging studies: No new pertinent imaging studies   Assessment/Plan:  82 y.o. female 6 Days Post-Op s/p exploratory laparotomy and small bowel resection (120 cms) secondary to internal hernia    -Full liquid diet  - Continue TPN;  - Monitor abdominal examination; on-going bowel function - Pain control prn; antiemetics prn - Monitor H&H; stable -monitor maroon stool output, hopefully chest resumption of GI function after residual blood from anastomosis  - Monitor leukocytosis; resolved  - Mobilize; PT - doing better  All of the above findings and recommendations were discussed with the patient, and the medical team, and all of patient's questions were answered  to her expressed satisfaction.

## 2022-12-13 NOTE — Progress Notes (Signed)
PHARMACY - TOTAL PARENTERAL NUTRITION CONSULT NOTE   Indication: Small bowel obstruction  Patient Measurements: Height: 5\' 5"  (165.1 cm) Weight: 64.5 kg (142 lb 3.2 oz) IBW/kg (Calculated) : 57 TPN AdjBW (KG): 62 Body mass index is 23.66 kg/m.  Assessment: 82 y/o female with h/o HTN, CKD III, DM, HLD, MDD, MS and a past hernia repair who is admitted with SBO and ischemic changes secondary to internal hernia now s/p exploratory laparotomy with reduction of internal hernia and small bowel resection (~120cms) with primary side to side functional end to end anastomosis 5/27.   Glucose / Insulin: BG 116-155 / previous 24h requiring 5 units SSI Electrolytes:WNL Renal: SCr~0.74 Hepatic: LFTs, bilirubin wnl ab baseline Intake / Output; MIVF:  GI Imaging: No new pertinent imaging studies  GI Surgeries / Procedures:  05/27 exploratory laparotomy and small bowel resection   Central access: 12/09/22  TPN start date: 12/09/22  Nutritional Goals: Goal TPN rate is 60 mL/hr (provides 85 g of protein and 1603 kcals per day)  RD Assessment:  Estimated Needs Total Energy Estimated Needs: 1500-1700kcal/day Total Protein Estimated Needs: 75-85g/day Total Fluid Estimated Needs: 1.5-1.7L/day  Current Nutrition:  CLD 5/31  Plan:  ---continue TPN at goal rate of 60 mL/hr  ---Electrolytes in TPN: Na 92mEq/L, K 54mEq/L, Ca 75mEq/L, Mg 8mEq/L, and Phos 5mmol/L. Cl:Ac 1:1 ---Add standard MVI and trace elements to TPN, thiamine completed ---Continue Sensitive q6h SSI and adjust as needed  ---Monitor TPN labs on Mon/Thurs  Bari Mantis PharmD Clinical Pharmacist 12/13/2022

## 2022-12-14 DIAGNOSIS — D62 Acute posthemorrhagic anemia: Secondary | ICD-10-CM | POA: Diagnosis not present

## 2022-12-14 DIAGNOSIS — N1831 Chronic kidney disease, stage 3a: Secondary | ICD-10-CM | POA: Diagnosis not present

## 2022-12-14 DIAGNOSIS — K56609 Unspecified intestinal obstruction, unspecified as to partial versus complete obstruction: Secondary | ICD-10-CM | POA: Diagnosis not present

## 2022-12-14 LAB — CBC
HCT: 23.8 % — ABNORMAL LOW (ref 36.0–46.0)
Hemoglobin: 7.8 g/dL — ABNORMAL LOW (ref 12.0–15.0)
MCH: 28.5 pg (ref 26.0–34.0)
MCHC: 32.8 g/dL (ref 30.0–36.0)
MCV: 86.9 fL (ref 80.0–100.0)
Platelets: 188 10*3/uL (ref 150–400)
RBC: 2.74 MIL/uL — ABNORMAL LOW (ref 3.87–5.11)
RDW: 17.2 % — ABNORMAL HIGH (ref 11.5–15.5)
WBC: 8 10*3/uL (ref 4.0–10.5)
nRBC: 0 % (ref 0.0–0.2)

## 2022-12-14 LAB — GLUCOSE, CAPILLARY
Glucose-Capillary: 125 mg/dL — ABNORMAL HIGH (ref 70–99)
Glucose-Capillary: 127 mg/dL — ABNORMAL HIGH (ref 70–99)
Glucose-Capillary: 127 mg/dL — ABNORMAL HIGH (ref 70–99)
Glucose-Capillary: 143 mg/dL — ABNORMAL HIGH (ref 70–99)
Glucose-Capillary: 147 mg/dL — ABNORMAL HIGH (ref 70–99)

## 2022-12-14 MED ORDER — TRACE MINERALS CU-MN-SE-ZN 300-55-60-3000 MCG/ML IV SOLN
INTRAVENOUS | Status: AC
  Filled 2022-12-14: qty 566.4

## 2022-12-14 MED ORDER — ENSURE ENLIVE PO LIQD
237.0000 mL | Freq: Two times a day (BID) | ORAL | Status: DC
  Administered 2022-12-14 – 2022-12-15 (×2): 237 mL via ORAL

## 2022-12-14 NOTE — Progress Notes (Signed)
PHARMACY - TOTAL PARENTERAL NUTRITION CONSULT NOTE   Indication: Small bowel obstruction  Patient Measurements: Height: 5\' 5"  (165.1 cm) Weight: 62.9 kg (138 lb 10.7 oz) IBW/kg (Calculated) : 57 TPN AdjBW (KG): 62 Body mass index is 23.08 kg/m.  Assessment: 82 y/o female with h/o HTN, CKD III, DM, HLD, MDD, MS and a past hernia repair who is admitted with SBO and ischemic changes secondary to internal hernia now s/p exploratory laparotomy with reduction of internal hernia and small bowel resection (~120cms) with primary side to side functional end to end anastomosis 5/27.   Glucose / Insulin: BG 116-140  previous 24h requiring 2 units SSI Electrolytes:WNL Renal: SCr~0.74 Hepatic: LFTs, bilirubin wnl ab baseline Intake / Output; MIVF:  GI Imaging: No new pertinent imaging studies  GI Surgeries / Procedures:  05/27 exploratory laparotomy and small bowel resection   Central access: 12/09/22  TPN start date: 12/09/22  Nutritional Goals: Goal TPN rate is 60 mL/hr (provides 85 g of protein and 1603 kcals per day)  RD Assessment:  Estimated Needs Total Energy Estimated Needs: 1500-1700kcal/day Total Protein Estimated Needs: 75-85g/day Total Fluid Estimated Needs: 1.5-1.7L/day  Current Nutrition:  Full liquid diet 6/1  Plan:  ---continue TPN at goal rate of 60 mL/hr at 1800 ---Electrolytes in TPN: Na 39mEq/L, K 38mEq/L, Ca 19mEq/L, Mg 37mEq/L, and Phos 9mmol/L. Cl:Ac 1:1 ---Add standard MVI and trace elements to TPN, thiamine completed ---Continue Sensitive q6h SSI and adjust as needed  ---Monitor TPN labs on Mon/Thurs  Bari Mantis PharmD Clinical Pharmacist 12/14/2022

## 2022-12-14 NOTE — Progress Notes (Signed)
Mobility Specialist - Progress Note      12/14/22 1543  Mobility  Activity Ambulated with assistance in room;Transferred to/from [facility]  Level of Assistance Contact guard assist, steadying assist  Assistive Device Front wheel walker  Distance Ambulated (ft) 3 ft  Range of Motion/Exercises Active  Activity Response Tolerated well  Mobility Referral Yes  $Mobility charge 1 Mobility  Mobility Specialist Start Time (ACUTE ONLY) 1533  Mobility Specialist Stop Time (ACUTE ONLY) 1543  Mobility Specialist Time Calculation (min) (ACUTE ONLY) 10 min   Pt resting in recliner on RA upon entry. Pt STS and ambulates (180 turn) and 2 steps to [facility] CGA with RW in room. Pt left on [facility] with needs in reach (call bell). Pt given education to call when she is ready and NT notified that pt is on [facility].   Johnathan Hausen Mobility Specialist 12/14/22, 3:48 PM

## 2022-12-14 NOTE — Progress Notes (Signed)
[facility] SURGICAL PROGRESS NOTE  Hospital Day(s): 7.   Post op day(s): 7 Days Post-Op.   Interval History:  Patient seen and examined, her son present in room visiting. No acute events or new complaints overnight.  Patient reports she is feeling much improved, no more bloody liquidy bowel movements. No fever, chills, nausea, emesis, seems to be tolerating diet better. Leukocytosis resolved; Hgb to 7.8; stable,  Renal function normal; sCr - 0.74 Hypokalemia to 3.7 Ambulated in room with PT  Vital signs in last 24 hours: [min-max] current  Temp:  [98.3 F (36.8 C)-98.9 F (37.2 C)] 98.3 F (36.8 C) (06/02 0919) Pulse Rate:  [77-91] 82 (06/02 0919) Resp:  [16-18] 18 (06/02 0919) BP: (107-119)/(59-76) 119/74 (06/02 0919) SpO2:  [99 %-100 %] 100 % (06/02 0919) Weight:  [62.9 kg] 62.9 kg (06/02 0500)     Height: 5\' 5"  (165.1 cm) Weight: 62.9 kg BMI (Calculated): 23.08   Intake/Output last 2 shifts:  06/01 0701 - 06/02 0700 In: 900 [P.O.:480; I.V.:420] Out: -    Physical Exam:  Constitutional: alert, cooperative and no distress  HEENT: NGT in place; output clearing Respiratory: breathing non-labored at rest  Cardiovascular: regular rate and sinus rhythm  Gastrointestinal: Soft, incisional soreness, and non-distended. No rebound/guarding Genitourinary: Foley catheter in place Integumentary: Laparotomy is intact with staples; no erythema; no drainage   Labs:     Latest Ref Rng & Units 12/14/2022    4:21 AM 12/13/2022    4:25 AM 12/12/2022    6:55 AM  CBC  WBC 4.0 - 10.5 K/uL 8.0  7.9  9.4   Hemoglobin 12.0 - 15.0 g/dL 7.8  8.2  8.7   Hematocrit 36.0 - 46.0 % 23.8  24.3  26.0   Platelets 150 - 400 K/uL 188  170  170       Latest Ref Rng & Units 12/13/2022    4:25 AM 12/12/2022    6:20 AM 12/11/2022    5:12 AM  CMP  Glucose 70 - 99 mg/dL 161  096  045   BUN 8 - 23 mg/dL 31  27  29    Creatinine 0.44 - 1.00 mg/dL 4.09  8.11  9.14   Sodium 135 - 145 mmol/L  143  145  146   Potassium 3.5 - 5.1 mmol/L 3.7  3.2  3.5   Chloride 98 - 111 mmol/L 111  116  115   CO2 22 - 32 mmol/L 24  25  24    Calcium 8.9 - 10.3 mg/dL 8.3  8.5  8.3   Total Protein 6.5 - 8.1 g/dL   5.4   Total Bilirubin 0.3 - 1.2 mg/dL   0.8   Alkaline Phos 38 - 126 U/L   49   AST 15 - 41 U/L   48   ALT 0 - 44 U/L   23      Imaging studies: No new pertinent imaging studies   Assessment/Plan:  82 y.o. female 7 Days Post-Op s/p exploratory laparotomy and small bowel resection (120 cms) secondary to internal hernia    -Full liquid diet  - Continue TPN;  - Monitor abdominal examination; on-going bowel function - Pain control prn; antiemetics prn - Monitor H&H; stable -monitor type of stool output.   - Monitor leukocytosis; resolved  - Mobilize; PT - doing better  All of the above findings and recommendations were discussed with the patient, and the medical team, and all of patient's questions were answered to her  expressed satisfaction.

## 2022-12-14 NOTE — Progress Notes (Signed)
Progress Note   Patient: Melanie Gray ZOX:[id number] DOB: [birth year] DOA: 12/07/2022     7 DOS: the patient was seen and examined on 12/14/2022   Brief hospital course: Melanie Gray is a 82 y.o. female with past medical history of Medical consultation for medical management on 82 year old female who is s/p emergent laparotomy for small bowel obstruction. Past medical history significant for DM, HTN, CKD 3A, HLD, depression, urge incontinence and osteoarthritis. Patient underwent partial small bowel resection on 5/27, patient developed post op ileus, was started on NG suction.  She is also started on TPN. Patient started having bowel movement 5/31, liquid diet started.  NG tube removed.   Principal Problem:   Small bowel obstruction (HCC) Active Problems:   S/P laparotomy   Sinus bradycardia   Benign hypertension   Chronic anemia   Chronic kidney disease, stage 3a (HCC)   Type 2 diabetes mellitus with renal complication (HCC)   Dyslipidemia   SBO (small bowel obstruction) (HCC)   Malnutrition of moderate degree   Hypernatremia   Hypophosphatemia   Lactic acidosis   Acute blood loss anemia   Small bowel ischemia (HCC)   Hypokalemia   Assessment and Plan:  Small bowel obstruction (HCC) S/p laparotomy 12/08/2022 Postop ileus. Lactic acidosis. Bowel ischemia status post bowel resection. Acute on chronic anemia secondary to blood loss. Patient had a small bowel resection 2 days ago, as expected, patient bowel has not recovered yet.  Patient was started on TPN after the PICC line was placed.   Patient had a significant lactic acidosis of 4.7, this appears to be secondary to small bowel ischemia which she was identified from surgery, the bowel has been resected. Patient bowel has recovered, multiple bowel movements.  Followed by general surgery, diet advanced to full liquid. Patient still has a poor appetite, TPN is continued.   Hypernatremia Hypophosphatemia. Chronic kidney disease  stage IIIa. Hypokalemia. Condition all improved.   Acute blood loss anemia. Hemoglobin dropped down to 6.5, general surgery ordered 2 units of PRBC, ordered 20 mg IV Lasix posttransfusion.  no iron or B12 deficiency. Hemoglobin is lower today, patient still has some dark-colored stool which is not a concern for general surgery.  Will continue monitor hemoglobin, transfuse as needed.   Sinus bradycardia Stable.   Diabetes mellitus with renal manifestation (HCC) Continue sliding scale insulin.     Benign hypertension Hydralazine IV as needed.       Subjective:  Patient appetites been poor, otherwise no complaint.  Had multiple loose stools today.  Physical Exam: Vitals:   12/13/22 2023 12/14/22 0500 12/14/22 0526 12/14/22 0919  BP: (!) 109/59  112/61 119/74  Pulse: 77  79 82  Resp: 16  17 18   Temp: 98.9 F (37.2 C)  98.6 F (37 C) 98.3 F (36.8 C)  TempSrc:      SpO2: 99%  100% 100%  Weight:  62.9 kg    Height:       General exam: Appears calm and comfortable  Respiratory system: Clear to auscultation. Respiratory effort normal. Cardiovascular system: S1 & S2 heard, RRR. No JVD, murmurs, rubs, gallops or clicks. No pedal edema. Gastrointestinal system: Abdomen is nondistended, soft and nontender. No organomegaly or masses felt. Normal bowel sounds heard. Central nervous system: Alert and oriented x3. No focal neurological deficits. Extremities: Symmetric 5 x 5 power. Skin: No rashes, lesions or ulcers Psychiatry: Judgement and insight appear normal. Mood & affect appropriate.    Data Reviewed:  Lab results reviewed.  Family Communication:   Disposition: Status is: Inpatient Remains inpatient appropriate because: Severity of disease, IV treatment.     Time spent: 35 minutes  Author: Marrion Coy, MD 12/14/2022 10:46 AM  For on call review [email or web address].

## 2022-12-15 DIAGNOSIS — I10 Essential (primary) hypertension: Secondary | ICD-10-CM | POA: Diagnosis not present

## 2022-12-15 DIAGNOSIS — D62 Acute posthemorrhagic anemia: Secondary | ICD-10-CM | POA: Diagnosis not present

## 2022-12-15 DIAGNOSIS — K56609 Unspecified intestinal obstruction, unspecified as to partial versus complete obstruction: Secondary | ICD-10-CM | POA: Diagnosis not present

## 2022-12-15 LAB — CBC
HCT: 23 % — ABNORMAL LOW (ref 36.0–46.0)
Hemoglobin: 7.6 g/dL — ABNORMAL LOW (ref 12.0–15.0)
MCH: 28.9 pg (ref 26.0–34.0)
MCHC: 33 g/dL (ref 30.0–36.0)
MCV: 87.5 fL (ref 80.0–100.0)
Platelets: 197 10*3/uL (ref 150–400)
RBC: 2.63 MIL/uL — ABNORMAL LOW (ref 3.87–5.11)
RDW: 17.1 % — ABNORMAL HIGH (ref 11.5–15.5)
WBC: 9.2 10*3/uL (ref 4.0–10.5)
nRBC: 0 % (ref 0.0–0.2)

## 2022-12-15 LAB — GLUCOSE, CAPILLARY
Glucose-Capillary: 142 mg/dL — ABNORMAL HIGH (ref 70–99)
Glucose-Capillary: 108 mg/dL — ABNORMAL HIGH (ref 70–99)
Glucose-Capillary: 120 mg/dL — ABNORMAL HIGH (ref 70–99)
Glucose-Capillary: 106 mg/dL — ABNORMAL HIGH (ref 70–99)

## 2022-12-15 LAB — COMPREHENSIVE METABOLIC PANEL
ALT: 41 U/L (ref 0–44)
AST: 39 U/L (ref 15–41)
Albumin: 2.5 g/dL — ABNORMAL LOW (ref 3.5–5.0)
Alkaline Phosphatase: 56 U/L (ref 38–126)
Anion gap: 6 (ref 5–15)
BUN: 35 mg/dL — ABNORMAL HIGH (ref 8–23)
CO2: 25 mmol/L (ref 22–32)
Calcium: 8.1 mg/dL — ABNORMAL LOW (ref 8.9–10.3)
Chloride: 108 mmol/L (ref 98–111)
Creatinine, Ser: 0.82 mg/dL (ref 0.44–1.00)
GFR, Estimated: >60 mL/min (ref >60)
Glucose, Bld: 142 mg/dL — ABNORMAL HIGH (ref 70–99)
Potassium: 4.1 mmol/L (ref 3.5–5.1)
Sodium: 139 mmol/L (ref 135–145)
Total Bilirubin: 0.4 mg/dL (ref 0.3–1.2)
Total Protein: 4.9 g/dL — ABNORMAL LOW (ref 6.5–8.1)

## 2022-12-15 LAB — MAGNESIUM: Magnesium: 2 mg/dL (ref 1.7–2.4)

## 2022-12-15 LAB — PHOSPHORUS: Phosphorus: 4.7 mg/dL — ABNORMAL HIGH (ref 2.5–4.6)

## 2022-12-15 LAB — TRIGLYCERIDES: Triglycerides: 38 mg/dL (ref <150)

## 2022-12-15 MED ORDER — FUROSEMIDE 10 MG/ML IJ SOLN
20.0000 mg | Freq: Once | INTRAMUSCULAR | Status: AC
  Administered 2022-12-15: 20 mg via INTRAVENOUS
  Filled 2022-12-15: qty 4

## 2022-12-15 MED ORDER — ADULT MULTIVITAMIN W/MINERALS CH
1.0000 | ORAL_TABLET | Freq: Every day | ORAL | Status: DC
  Administered 2022-12-16 – 2022-12-17 (×2): 1 via ORAL
  Filled 2022-12-15 (×2): qty 1

## 2022-12-15 MED ORDER — SODIUM CHLORIDE 0.9 % IV SOLN
300.0000 mg | Freq: Once | INTRAVENOUS | Status: AC
  Administered 2022-12-15: 300 mg via INTRAVENOUS
  Filled 2022-12-15: qty 300

## 2022-12-15 MED ORDER — TRACE MINERALS CU-MN-SE-ZN 300-55-60-3000 MCG/ML IV SOLN
INTRAVENOUS | Status: DC
  Filled 2022-12-15: qty 283.2

## 2022-12-15 MED ORDER — ENSURE ENLIVE PO LIQD
237.0000 mL | Freq: Three times a day (TID) | ORAL | Status: DC
  Administered 2022-12-16 – 2022-12-17 (×4): 237 mL via ORAL

## 2022-12-15 NOTE — Progress Notes (Signed)
Progress Note   Patient: Melanie Gray ZOX:[id number] DOB: 1941/01/22 DOA: 12/07/2022     8 DOS: the patient was seen and examined on 12/15/2022   Brief hospital course: Noshin Degrazia is a 82 y.o. female with past medical history of Medical consultation for medical management on 82 year old female who is s/p emergent laparotomy for small bowel obstruction. Past medical history significant for DM, HTN, CKD 3A, HLD, depression, urge incontinence and osteoarthritis. Patient underwent partial small bowel resection on 5/27, patient developed post op ileus, was started on NG suction.  She is also started on TPN. Patient started having bowel movement 5/31, liquid diet started.  NG tube removed.   Principal Problem:   Small bowel obstruction (HCC) Active Problems:   S/P laparotomy   Sinus bradycardia   Benign hypertension   Chronic anemia   Chronic kidney disease, stage 3a (HCC)   Type 2 diabetes mellitus with renal complication (HCC)   Dyslipidemia   SBO (small bowel obstruction) (HCC)   Malnutrition of moderate degree   Hypernatremia   Hypophosphatemia   Lactic acidosis   Acute blood loss anemia   Small bowel ischemia (HCC)   Hypokalemia   Assessment and Plan:  Small bowel obstruction (HCC) S/p laparotomy 12/08/2022 Postop ileus. Lactic acidosis. Bowel ischemia status post bowel resection. Patient had a small bowel resection 2 days ago, as expected, patient bowel has not recovered yet.  Patient was started on TPN after the PICC line was placed.   Patient had a significant lactic acidosis of 4.7, this appears to be secondary to small bowel ischemia which she was identified from surgery, the bowel has been resected. Condition has improved, general surgery is weaning off TPN.    Hypernatremia Hypophosphatemia. Chronic kidney disease stage IIIa. Hypokalemia. Condition all improved.   Acute blood loss anemia. Hemoglobin dropped down to 6.5, general surgery ordered 2 units of PRBC,  ordered 20 mg IV Lasix posttransfusion.  no iron or B12 deficiency. Discussed with general surgery, some rectal bleeding is expected.  Hemoglobin dropped down to 7.6 today, will give IV iron.  Recheck a CBC tomorrow.   Sinus bradycardia Stable.   Diabetes mellitus with renal manifestation (HCC) Continue sliding scale insulin.     Benign hypertension Pressures well controlled     Subjective:  Patient doing well, appetite improving.  Tolerating soft diet.  No shortness of breath.  Physical Exam: Vitals:   12/14/22 1941 12/15/22 0500 12/15/22 0505 12/15/22 0730  BP: 105/68  (!) 110/52 110/60  Pulse: 94  81 87  Resp: 16  16 18   Temp: 98.3 F (36.8 C)  98.4 F (36.9 C) 99.1 F (37.3 C)  TempSrc:   Oral   SpO2: 100%  99% 100%  Weight:  64.9 kg    Height:       General exam: Appears calm and comfortable  Respiratory system: Clear to auscultation. Respiratory effort normal. Cardiovascular system: S1 & S2 heard, RRR. No JVD, murmurs, rubs, gallops or clicks. No pedal edema. Gastrointestinal system: Abdomen is nondistended, soft and nontender. No organomegaly or masses felt. Normal bowel sounds heard. Central nervous system: Alert and oriented. No focal neurological deficits. Extremities: Symmetric 5 x 5 power. Skin: No rashes, lesions or ulcers Psychiatry: Judgement and insight appear normal. Mood & affect appropriate.    Data Reviewed:  Lab results reviewed.  Family Communication: None  Disposition: Status is: Inpatient Remains inpatient appropriate because: Severity of disease, IV treatment.     Time spent: 35 minutes  Author:  Marrion Coy, MD 12/15/2022 12:21 PM  For on call review [email or web address].

## 2022-12-15 NOTE — Progress Notes (Signed)
PHARMACY - TOTAL PARENTERAL NUTRITION CONSULT NOTE   Indication: Small bowel obstruction  Patient Measurements: Height: 5\' 5"  (165.1 cm) Weight: 64.9 kg (143 lb 1.3 oz) IBW/kg (Calculated) : 57 TPN AdjBW (KG): 62 Body mass index is 23.81 kg/m.  Assessment: 82 y/o female with h/o HTN, CKD III, DM, HLD, MDD, MS and a past hernia repair who is admitted with SBO and ischemic changes secondary to internal hernia now s/p exploratory laparotomy with reduction of internal hernia and small bowel resection (~120cms) with primary side to side functional end to end anastomosis 5/27.   Glucose / Insulin: BG 125 - 147  previous 24h requiring 5 units SSI Electrolytes: Mild hyperphosphatemia Renal: Scr < 1 Hepatic: Within normal limits Intake / Output: + 900 mL GI Imaging: No new pertinent imaging studies  GI Surgeries / Procedures:  5/27 exploratory laparotomy and small bowel resection   Central access: 12/09/22  TPN start date: 12/09/22  Nutritional Goals: Goal TPN rate is 60 mL/hr (provides 85 g of protein and 1603 kcals per day)  RD Assessment:  Estimated Needs Total Energy Estimated Needs: 1500-1700kcal/day Total Protein Estimated Needs: 75-85g/day Total Fluid Estimated Needs: 1.5-1.7L/day  Current Nutrition:  Soft diet 6/2  Plan:  --Decrease TPN to half rate per discussion with general surgery. Plan to discontinue 6/4 --Electrolytes in TPN: Na 45 mEq/L, K 50 mEq/L, Ca 5 mEq/L, Mg 5 mEq/L, and Phos 20 mmol/L. Cl:Ac 1:1 --Add standard MVI and trace elements to TPN, thiamine completed --Continue Sensitive q6h SSI and adjust as needed  --Monitor TPN labs on Mon/Thurs  Melanie Gray 12/15/2022

## 2022-12-15 NOTE — Progress Notes (Signed)
Physical Therapy Treatment Patient Details Name: Melanie Gray MRN: [id number] DOB: 1940-09-03 Today's Date: 12/15/2022   History of Present Illness Pt admitted for SBO and is s/p exploratory laparotomy on 12/08/22. History includes DM, HTN, CKD, HLD, depression, and MS.    PT Comments    Pt sitting in recliner upon PT arrival and requesting to toilet (NT came to assist).  No c/o pain during session.  Pt mod assist for transfers using RW (vc's required for positioning/technique) and CGA to ambulate 220 feet with RW use (chair follow provided).  Pt's R knee "gave" 1x during ambulation but pt able to self correct.  Pt progressing with functional mobility and ambulation but still requiring assist for standing.  Pt and pt's son educated on pt's current assist levels/needs.  Will continue to focus on strengthening, balance, and progressive functional mobility during hospitalization.    Recommendations for follow up therapy are one component of a multi-disciplinary discharge planning process, led by the attending physician.  Recommendations may be updated based on patient status, additional functional criteria and insurance authorization.  Follow Up Recommendations  Can patient physically be transported by private vehicle: Yes    Assistance Recommended at Discharge Frequent or constant Supervision/Assistance  Patient can return home with the following A lot of help with walking and/or transfers;Assist for transportation;Help with stairs or ramp for entrance;A lot of help with bathing/dressing/bathroom   Equipment Recommendations  Rolling walker (2 wheels);BSC/3in1    Recommendations for Other Services       Precautions / Restrictions Precautions Precautions: Fall Precaution Comments: abdominal incision; R PICC Restrictions Weight Bearing Restrictions: No     Mobility  Bed Mobility               General bed mobility comments: Deferred (pt in recliner beginning/end of session)     Transfers Overall transfer level: Needs assistance Equipment used: Rolling walker (2 wheels) Transfers: Sit to/from Stand, Bed to chair/wheelchair/BSC Sit to Stand: Mod assist   Step pivot transfers: Min assist (stand step turn bed to/from [facility] with RW use)       General transfer comment: assist to initiate and come to full stand (x2 trials from recliner and x1 trial from [facility]); vc's for UE/LE placement/positioning; vc's to scoot forward towards edge of sitting surface and for wider BOS prior to standing    Ambulation/Gait Ambulation/Gait assistance: Min guard Gait Distance (Feet): 220 Feet Assistive device: Rolling walker (2 wheels)   Gait velocity: decreased     General Gait Details: mild decreased stance time R LE; R knee appearing to "give" 1x but pt able to self correct; partial step through gait pattern   Stairs             Wheelchair Mobility    Modified Rankin (Stroke Patients Only)       Balance Overall balance assessment: Needs assistance, History of Falls Sitting-balance support: No upper extremity supported, Feet supported Sitting balance-Leahy Scale: Good Sitting balance - Comments: steady sitting reaching within BOS   Standing balance support: Bilateral upper extremity supported, Reliant on assistive device for balance, During functional activity Standing balance-Leahy Scale: Fair Standing balance comment: steady ambulating with RW use                            Cognition Arousal/Alertness: Awake/alert Behavior During Therapy: WFL for tasks assessed/performed Overall Cognitive Status: Within Functional Limits for tasks assessed  General Comments: Pleasant and motivated        Exercises      General Comments  Nursing cleared pt for participation in physical therapy.  Pt agreeable to PT session.  Pt's son present part of session.      Pertinent Vitals/Pain Pain Assessment Pain  Assessment: No/denies pain Pain Intervention(s): Limited activity within patient's tolerance, Monitored during session, Repositioned HR stable and WFL throughout treatment session.    Home Living                          Prior Function            PT Goals (current goals can now be found in the care plan section) Acute Rehab PT Goals Patient Stated Goal: to get stronger PT Goal Formulation: With patient Time For Goal Achievement: 12/23/22 Potential to Achieve Goals: Good Additional Goals Additional Goal #1: Pt will be able to perform bed mobility/transfers with cga and safe technique in order to improve functional independence Progress towards PT goals: Progressing toward goals    Frequency    Min 3X/week      PT Plan Current plan remains appropriate    Co-evaluation              AM-PAC PT "6 Clicks" Mobility   Outcome Measure  Help needed turning from your back to your side while in a flat bed without using bedrails?: A Little Help needed moving from lying on your back to sitting on the side of a flat bed without using bedrails?: A Lot Help needed moving to and from a bed to a chair (including a wheelchair)?: A Little Help needed standing up from a chair using your arms (e.g., wheelchair or bedside chair)?: A Lot Help needed to walk in hospital room?: A Little Help needed climbing 3-5 steps with a railing? : A Lot 6 Click Score: 15    End of Session Equipment Utilized During Treatment: Gait belt (up high away from abdominal incision) Activity Tolerance: Patient tolerated treatment well Patient left: in chair;with call bell/phone within reach;with chair alarm set;with family/visitor present Nurse Communication: Mobility status;Precautions PT Visit Diagnosis: Unsteadiness on feet (R26.81);Repeated falls (R29.6);Muscle weakness (generalized) (M62.81);History of falling (Z91.81);Difficulty in walking, not elsewhere classified (R26.2);Pain Pain -  Right/Left:  (abdominal)     Time: 7829-5621 PT Time Calculation (min) (ACUTE ONLY): 47 min  Charges:  $Gait Training: 8-22 mins $Therapeutic Activity: 23-37 mins                     [employer], PT 12/15/22, 11:12 AM

## 2022-12-15 NOTE — Progress Notes (Signed)
[facility] SURGICAL PROGRESS NOTE  Hospital Day(s): 8.   Post op day(s): 8 Days Post-Op.   Interval History:  Patient seen and examined No acute events or new complaints overnight.  Patient doing well; up in chair No significant abdominal pain No fever, chills, nausea, emesis  Labs are reassuring; Hgb 7.6 (from 7.8), mild hyperphosphatemia to 4.7 She is on TPN; 60 ml/hr Now on soft diet Continues to have bowel function   Vital signs in last 24 hours: [min-max] current  Temp:  [98.3 F (36.8 C)-99.1 F (37.3 C)] 99.1 F (37.3 C) (06/03 0730) Pulse Rate:  [81-94] 87 (06/03 0730) Resp:  [16-18] 18 (06/03 0730) BP: (105-119)/(52-74) 110/60 (06/03 0730) SpO2:  [99 %-100 %] 100 % (06/03 0730) Weight:  [64.9 kg] 64.9 kg (06/03 0500)     Height: 5\' 5"  (165.1 cm) Weight: 64.9 kg BMI (Calculated): 23.81   Intake/Output last 2 shifts:  06/02 0701 - 06/03 0700 In: 360.6 [P.O.:360; I.V.:0.6] Out: 200 [Urine:200]   Physical Exam:  Constitutional: alert, cooperative and no distress  Respiratory: breathing non-labored at rest  Cardiovascular: regular rate and sinus rhythm  Gastrointestinal: Soft, incisional soreness, and non-distended. No rebound/guarding Integumentary: Laparotomy is intact with staples; no erythema; no drainage   Labs:     Latest Ref Rng & Units 12/15/2022    4:38 AM 12/14/2022    4:21 AM 12/13/2022    4:25 AM  CBC  WBC 4.0 - 10.5 K/uL 9.2  8.0  7.9   Hemoglobin 12.0 - 15.0 g/dL 7.6  7.8  8.2   Hematocrit 36.0 - 46.0 % 23.0  23.8  24.3   Platelets 150 - 400 K/uL 197  188  170       Latest Ref Rng & Units 12/15/2022    4:38 AM 12/13/2022    4:25 AM 12/12/2022    6:20 AM  CMP  Glucose 70 - 99 mg/dL 161  096  045   BUN 8 - 23 mg/dL 35  31  27   Creatinine 0.44 - 1.00 mg/dL 4.09  8.11  9.14   Sodium 135 - 145 mmol/L 139  143  145   Potassium 3.5 - 5.1 mmol/L 4.1  3.7  3.2   Chloride 98 - 111 mmol/L 108  111  116   CO2 22 - 32 mmol/L 25  24  25     Calcium 8.9 - 10.3 mg/dL 8.1  8.3  8.5   Total Protein 6.5 - 8.1 g/dL 4.9     Total Bilirubin 0.3 - 1.2 mg/dL 0.4     Alkaline Phos 38 - 126 U/L 56     AST 15 - 41 U/L 39     ALT 0 - 44 U/L 41        Imaging studies: No new pertinent imaging studies   Assessment/Plan:  82 y.o. female 8 Days Post-Op s/p exploratory laparotomy and small bowel resection (120 cms) secondary to internal hernia   - Continue soft diet   - Wean can wean to discontinue TPN over next 24 hours  - Monitor abdominal examination; on-going bowel function - Pain control prn; antiemetics prn - Monitor H&H; stable; getting iron; transfuse as needed  - Mobilize; PT - doing better  - Further management per primary service   - Discharge Planning: Wean to discontinue TPN in next 24 hours. Okay to begin discharge planning. Suspect ready for DC; likely tomorrow (06/04) or Wednesday.   All of the above findings and  recommendations were discussed with the patient, and the medical team, and all of patient's questions were answered to her expressed satisfaction.  -- Lynden Oxford, PA-C [facility] 12/15/2022, 7:31 AM M-F: 7am - 4pm

## 2022-12-15 NOTE — Care Management Important Message (Signed)
Important Message  Patient Details  Name: Melanie Gray MRN: [id number] Date of Birth: August 20, 1940   Medicare Important Message Given:  Yes     [name] 12/15/2022, 10:34 AM

## 2022-12-16 DIAGNOSIS — N1831 Chronic kidney disease, stage 3a: Secondary | ICD-10-CM | POA: Diagnosis not present

## 2022-12-16 DIAGNOSIS — K56609 Unspecified intestinal obstruction, unspecified as to partial versus complete obstruction: Secondary | ICD-10-CM | POA: Diagnosis not present

## 2022-12-16 DIAGNOSIS — D62 Acute posthemorrhagic anemia: Secondary | ICD-10-CM | POA: Diagnosis not present

## 2022-12-16 LAB — CBC
HCT: 24 % — ABNORMAL LOW (ref 36.0–46.0)
Hemoglobin: 7.8 g/dL — ABNORMAL LOW (ref 12.0–15.0)
MCH: 28.7 pg (ref 26.0–34.0)
MCHC: 32.5 g/dL (ref 30.0–36.0)
MCV: 88.2 fL (ref 80.0–100.0)
Platelets: 228 10*3/uL (ref 150–400)
RBC: 2.72 MIL/uL — ABNORMAL LOW (ref 3.87–5.11)
RDW: 17 % — ABNORMAL HIGH (ref 11.5–15.5)
WBC: 8.4 10*3/uL (ref 4.0–10.5)
nRBC: 0 % (ref 0.0–0.2)

## 2022-12-16 LAB — GLUCOSE, CAPILLARY
Glucose-Capillary: 104 mg/dL — ABNORMAL HIGH (ref 70–99)
Glucose-Capillary: 97 mg/dL (ref 70–99)

## 2022-12-16 MED ORDER — PANTOPRAZOLE SODIUM 40 MG PO TBEC
40.0000 mg | DELAYED_RELEASE_TABLET | Freq: Two times a day (BID) | ORAL | Status: DC
  Administered 2022-12-16 – 2022-12-17 (×3): 40 mg via ORAL
  Filled 2022-12-16 (×3): qty 1

## 2022-12-16 NOTE — Progress Notes (Signed)
Mobility Specialist - Progress Note    12/16/22 0909  Mobility  Activity Transferred to/from [facility];Ambulated with assistance in hallway  Level of Assistance Contact guard assist, steadying assist  Assistive Device Front wheel walker  Distance Ambulated (ft) 10 ft  Range of Motion/Exercises Active  Activity Response Tolerated well  Mobility Referral Yes  $Mobility charge 1 Mobility  Mobility Specialist Start Time (ACUTE ONLY) [id number]  Mobility Specialist Stop Time (ACUTE ONLY) 0850  Mobility Specialist Time Calculation (min) (ACUTE ONLY) 13 min   Pt resting in recliner on RA upon entry. Pt STS MinA and ambulates to [facility] CGA with RW. Pt returned to recliner and left with needs in reach; bed alarm activated.   Johnathan Hausen Mobility Specialist 12/16/22, 9:12 AM

## 2022-12-16 NOTE — Consult Note (Signed)
[facility] Customer service manager [facility]) Accountable Care Organization (ACO) [facility] Liaison Note  12/16/2022  Melanie Gray [birth year] [id number]  Location: [facility] RN Hospital Liaison screened the patient remotely at [facility].  Insurance: MCR ACO   Melanie Gray is a 82 y.o. female who is a Primary Care Patient of Melanie Cory, MD. The patient was screened for readmission hospitalization with noted medium risk score for unplanned readmission risk with 1 IP in 6 months.  The patient was assessed for potential [facility]) Care Management service needs for post hospital transition for care coordination. Review of patient's electronic medical record reveals patient  was admitted with Small Bowel Obstruction. TOC notes indicates pt will be discharged with [facility] and declined the recommend STR. Pt has a supportive son in the home and daughter who lives out of town however supportive.  Plan: [facility] Liaison will continue to follow progress and disposition to asess for post hospital community care coordination/management needs.  Referral request for community care coordination: anticipate [facility] Transitions of Care Team follow up.   [facility] Care Management/Population Health does not replace or interfere with any arrangements made by the Inpatient Transition of Care team.   For questions contact:   Elliot Cousin, RN, BSN [facility] Liaison [place]   [facility]  Population Health Office Hours MTWF  8:00 am-6:00 pm Off on Thursday [phone] mobile [phone] [Office toll free line][facility] Office Hours are M-F 8:30 - 5 pm 24 hour nurse advise line [phone] Concierge  [email or web address]

## 2022-12-16 NOTE — TOC Progression Note (Signed)
Transition of Care [facility]) - Progression Note    Patient Details  Name: Melanie Gray MRN: [id number] Date of Birth: 1940-10-04  Transition of Care [facility]) CM/SW Contact  Chapman Fitch, RN Phone Number: 12/16/2022, 3:02 PM  Clinical Narrative:    Anticipated dc tomorrow Daughter shannon and son in room Patient and family in agreement for [facility] and RW.  Referral made to [facility] with [employer] Daughter will be out of state 6/12-6/17.  Son request assessment with bathing the patient in the home.  Per [name] with [facility] an aide would be able to come to the house 3 times that week.  Patient and family updated  Daughter [name] states that she will transport at discharge    Expected Discharge Plan: Home w Home Health Services Barriers to Discharge: Continued Medical Work up  Expected Discharge Plan and Services     Post Acute Care Choice: Home Health                                         Social Determinants of Health (SDOH) Interventions SDOH Screenings   Food Insecurity: No Food Insecurity (12/10/2022)  Housing: Low Risk  (12/10/2022)  Transportation Needs: No Transportation Needs (12/10/2022)  Utilities: Not At Risk (12/10/2022)  Alcohol Screen: Low Risk  (12/24/2021)  Depression (PHQ2-9): Low Risk  (08/29/2022)  Financial Resource Strain: Low Risk  (12/24/2021)  Physical Activity: Inactive (12/24/2021)  Social Connections: Socially Isolated (12/24/2021)  Stress: No Stress Concern Present (12/24/2021)  Tobacco Use: Low Risk  (12/08/2022)    Readmission Risk Interventions     No data to display

## 2022-12-16 NOTE — Discharge Instructions (Signed)
In addition to included general post-operative instructions,  Diet: Resume home diet.   Activity: No heavy lifting >20 pounds (children, pets, laundry, garbage) or strenuous activity for 6 weeks from date of surgery, but light activity and walking are encouraged. Do not drive or drink alcohol if taking narcotic pain medications or having pain that might distract from driving. Okay to work with therapies  Wound care: You may shower/get incision wet with soapy water and pat dry (do not rub incisions), but no baths or submerging incision underwater until follow-up.   Medications: Resume all home medications. For mild to moderate pain: acetaminophen (Tylenol) or ibuprofen/naproxen (if no kidney disease). Combining Tylenol with alcohol can substantially increase your risk of causing liver disease. Narcotic pain medications, if prescribed, can be used for severe pain, though may cause nausea, constipation, and drowsiness. Do not combine Tylenol and Percocet (or similar) within a 6 hour period as Percocet (and similar) contain(s) Tylenol. If you do not need the narcotic pain medication, you do not need to fill the prescription.  Call office [phone] / [phone]) at any time if any questions, worsening pain, fevers/chills, bleeding, drainage from incision site, or other concerns.

## 2022-12-16 NOTE — Progress Notes (Addendum)
Progress Note   Patient: Melanie Gray ZOX:[id number] DOB: 1940-10-06 DOA: 12/07/2022     9 DOS: the patient was seen and examined on 12/16/2022   Brief hospital course: Clarene Abramowski is a 82 y.o. female with past medical history of Medical consultation for medical management on 82 year old female who is s/p emergent laparotomy for small bowel obstruction. Past medical history significant for DM, HTN, CKD 3A, HLD, depression, urge incontinence and osteoarthritis. Patient underwent partial small bowel resection on 5/27, patient developed post op ileus, was started on NG suction.  She is also started on TPN. Patient started having bowel movement 5/31, diet started.  NG tube removed.    Principal Problem:   Small bowel obstruction (HCC) Active Problems:   S/P laparotomy   Sinus bradycardia   Benign hypertension   Chronic anemia   Chronic kidney disease, stage 3a (HCC)   Type 2 diabetes mellitus with renal complication (HCC)   Dyslipidemia   SBO (small bowel obstruction) (HCC)   Malnutrition of moderate degree   Hypernatremia   Hypophosphatemia   Lactic acidosis   Acute blood loss anemia   Small bowel ischemia (HCC)   Hypokalemia   Assessment and Plan: Small bowel obstruction (HCC) S/p laparotomy 12/08/2022 Postop ileus. Lactic acidosis. Bowel ischemia status post bowel resection. Patient had a small bowel resection.  Patient was started on TPN after the PICC line was placed.   Patient had a significant lactic acidosis of 4.7, this appears to be secondary to small bowel ischemia which was identified from surgery, the bowel has been resected. Patient condition has improved, had multiple bowel movements.  TPN discontinued, patient placed on soft diet.  Discussed with general surgery, they prefer patient stay another day'  can be discharged tomorrow if condition stable. She will go home with [facility], not going to SNF as recommended by PT.    Hypernatremia Hypophosphatemia. Chronic kidney  disease stage IIIa. Hypokalemia. Condition all improved.   Acute blood loss anemia. Hemoglobin dropped down to 6.5, general surgery ordered 2 units of PRBC, ordered 20 mg IV Lasix posttransfusion.  no iron or B12 deficiency. Discussed with general surgery, some rectal bleeding is expected.   Patient received IV iron yesterday, hemoglobin is low but stable.  Recheck a CBC tomorrow.  Patient on twice daily PPI, but does not seem to have any GI bleed.  Currently, bowel has cleared up, no additional black stool.   Sinus bradycardia Stable.   Diabetes mellitus with renal manifestation (HCC) Continue sliding scale insulin.     Benign hypertension BP well controlled      Subjective:  Patient doing much better, appetite is still poor, but tolerating diet well.  Physical Exam: Vitals:   12/15/22 1902 12/16/22 0400 12/16/22 0412 12/16/22 0803  BP: 127/79 (!) 112/54  112/61  Pulse: 86 84  72  Resp: 16 18  16   Temp: 98.3 F (36.8 C) 100 F (37.8 C)  99.1 F (37.3 C)  TempSrc: Oral Oral  Oral  SpO2: 93% 99%  99%  Weight:   65.2 kg   Height:       General exam: Appears calm and comfortable  Respiratory system: Clear to auscultation. Respiratory effort normal. Cardiovascular system: S1 & S2 heard, RRR. No JVD, murmurs, rubs, gallops or clicks. No pedal edema. Gastrointestinal system: Abdomen is nondistended, soft and nontender. No organomegaly or masses felt. Normal bowel sounds heard. Central nervous system: Alert and oriented. No focal neurological deficits. Extremities: Symmetric 5 x 5 power. Skin:  No rashes, lesions or ulcers Psychiatry: Judgement and insight appear normal. Mood & affect appropriate.    Data Reviewed:  Lab results reviewed.  Family Communication: None  Disposition: Status is: Inpatient Remains inpatient appropriate because: Severity of disease,     Time spent: 35 minutes  Author: Marrion Coy, MD 12/16/2022 12:25 PM  For on call review  [email or web address].

## 2022-12-16 NOTE — Progress Notes (Signed)
Patient is not able to walk the distance required to go the bathroom, or he/she is unable to safely negotiate stairs required to access the bathroom.  A 3in1 BSC will alleviate this problem  

## 2022-12-16 NOTE — Progress Notes (Signed)
PHARMACIST - PHYSICIAN COMMUNICATION  CONCERNING: IV to Oral Route Change Policy  RECOMMENDATION: This patient is receiving pantoprazole by the intravenous route.  Based on criteria approved by the Pharmacy and Therapeutics Committee, the intravenous medication(s) is/are being converted to the equivalent oral dose form(s).  DESCRIPTION: These criteria include: The patient is eating (either orally or via tube) and/or has been taking other orally administered medications for a least 24 hours The patient has no evidence of active gastrointestinal bleeding or impaired GI absorption (gastrectomy, short bowel, patient on TNA or NPO).  If you have questions about this conversion, please contact the Pharmacy Department   Tressie Ellis, [facility] 12/16/2022 9:49 AM

## 2022-12-16 NOTE — Progress Notes (Signed)
Physical Therapy Treatment Patient Details Name: Melanie Gray MRN: [id number] DOB: 1941/04/24 Today's Date: 12/16/2022   History of Present Illness Pt admitted for SBO and is s/p exploratory laparotomy on 12/08/22. History includes DM, HTN, CKD, HLD, depression, and MS.    PT Comments    Pt resting in recliner upon PT arrival; pt reporting abdominal discomfort from gas pains--nurse notified.  Transfers: pt max assist to stand up 1st trial; min assist to stand up x3 trials; vc's for UE/LE placement and to scoot forward towards edge of sitting surface (and for wider BOS) prior to standing; pt with difficulty bringing feet closer prior to standing so once pt pushed up to stand pt cued to walk her feet back prior to fully standing which decreased her physical assist needs (pt reports her family is going to buy her a lift chair).  Pt able to ambulate around nursing loop with RW CGA.  Will continue to focus on strengthening, balance, and progressive functional mobility during hospitalization.   Recommendations for follow up therapy are one component of a multi-disciplinary discharge planning process, led by the attending physician.  Recommendations may be updated based on patient status, additional functional criteria and insurance authorization.  Follow Up Recommendations  Can patient physically be transported by private vehicle: Yes    Assistance Recommended at Discharge Frequent or constant Supervision/Assistance  Patient can return home with the following A lot of help with walking and/or transfers;Assist for transportation;Help with stairs or ramp for entrance;A lot of help with bathing/dressing/bathroom   Equipment Recommendations  Rolling walker (2 wheels);BSC/3in1    Recommendations for Other Services       Precautions / Restrictions Precautions Precautions: Fall Precaution Comments: abdominal incision; R PICC Restrictions Weight Bearing Restrictions: No     Mobility  Bed Mobility                General bed mobility comments: Deferred (pt in recliner beginning/end of session)    Transfers Overall transfer level: Needs assistance Equipment used: Rolling walker (2 wheels) Transfers: Sit to/from Stand Sit to Stand: Max assist, Min assist           General transfer comment: max assist to stand up 1st trial; min assist to stand up x3 trials; vc's for UE/LE placement and to scoot forward towards edge of sitting surface (and for wider BOS) prior to standing; pt with difficulty bringing feet closer prior to standing so once pt pushed up to stand pt cued to walk her feet back prior to fully standing which decreased her physical assist needs    Ambulation/Gait Ambulation/Gait assistance: Min guard Gait Distance (Feet): 220 Feet Assistive device: Rolling walker (2 wheels)   Gait velocity: decreased     General Gait Details: mild decreased stance time R LE; partial step through progressing to step through gait pattern   Stairs             Wheelchair Mobility    Modified Rankin (Stroke Patients Only)       Balance Overall balance assessment: Needs assistance, History of Falls Sitting-balance support: No upper extremity supported, Feet supported Sitting balance-Leahy Scale: Good Sitting balance - Comments: steady sitting reaching within BOS   Standing balance support: Bilateral upper extremity supported, Reliant on assistive device for balance, During functional activity Standing balance-Leahy Scale: Good Standing balance comment: steady ambulating with RW use  Cognition Arousal/Alertness: Awake/alert Behavior During Therapy: WFL for tasks assessed/performed Overall Cognitive Status: Within Functional Limits for tasks assessed                                 General Comments: Pleasant and motivated        Exercises      General Comments  Nursing cleared pt for participation in physical  therapy.  Pt agreeable to PT session.      Pertinent Vitals/Pain Pain Assessment Pain Assessment: Faces Faces Pain Scale: Hurts little more Pain Location: abdominal discomfort from gas Pain Descriptors / Indicators: Discomfort Pain Intervention(s): Limited activity within patient's tolerance, Monitored during session, Repositioned, Other (comment) (RN notified) Vitals (HR and O2 on room air) stable and WFL throughout treatment session.    Home Living                          Prior Function            PT Goals (current goals can now be found in the care plan section) Acute Rehab PT Goals Patient Stated Goal: to get stronger PT Goal Formulation: With patient Time For Goal Achievement: 12/23/22 Potential to Achieve Goals: Good Additional Goals Additional Goal #1: Pt will be able to perform bed mobility/transfers with cga and safe technique in order to improve functional independence Progress towards PT goals: Progressing toward goals    Frequency    Min 3X/week      PT Plan Current plan remains appropriate    Co-evaluation              AM-PAC PT "6 Clicks" Mobility   Outcome Measure  Help needed turning from your back to your side while in a flat bed without using bedrails?: A Little Help needed moving from lying on your back to sitting on the side of a flat bed without using bedrails?: A Little Help needed moving to and from a bed to a chair (including a wheelchair)?: A Little Help needed standing up from a chair using your arms (e.g., wheelchair or bedside chair)?: A Lot Help needed to walk in hospital room?: A Little Help needed climbing 3-5 steps with a railing? : A Lot 6 Click Score: 16    End of Session Equipment Utilized During Treatment: Gait belt (up high away from abdominal incision) Activity Tolerance: Patient tolerated treatment well Patient left: in chair;with call bell/phone within reach;with chair alarm set Nurse Communication:  Mobility status;Precautions PT Visit Diagnosis: Unsteadiness on feet (R26.81);Repeated falls (R29.6);Muscle weakness (generalized) (M62.81);History of falling (Z91.81);Difficulty in walking, not elsewhere classified (R26.2);Pain Pain - Right/Left:  (abdominal)     Time: 1610-9604 PT Time Calculation (min) (ACUTE ONLY): 23 min  Charges:  $Gait Training: 8-22 mins $Therapeutic Activity: 8-22 mins                     Hendricks Limes, PT 12/16/22, 5:39 PM

## 2022-12-16 NOTE — Progress Notes (Signed)
[facility]ESS NOTE  Hospital Day(s): 9.   Post op day(s): 9 Days Post-Op.   Interval History:  Patient seen and examined No acute events or new complaints overnight.  Patient doing well; up in chair with breakfast Decreased appetite No significant abdominal pain No fever, chills, nausea, emesis  Labs are reassuring; Hgb 7.8 (from 7.6) Weaning TPN;30 ml/hr - Stop today  Now on soft diet Continues to have bowel function   Vital signs in last 24 hours: [min-max] current  Temp:  [98.3 F (36.8 C)-100 F (37.8 C)] 99.1 F (37.3 C) (06/04 0803) Pulse Rate:  [72-87] 72 (06/04 0803) Resp:  [16-18] 16 (06/04 0803) BP: (112-128)/(54-79) 112/61 (06/04 0803) SpO2:  [93 %-99 %] 99 % (06/04 0803) Weight:  [65.2 kg] 65.2 kg (06/04 0412)     Height: 5\' 5"  (165.1 cm) Weight: 65.2 kg BMI (Calculated): 23.92   Intake/Output last 2 shifts:  06/03 0701 - 06/04 0700 In: 780.7 [P.O.:540; I.V.:240.7] Out: 900 [Urine:900]   Physical Exam:  Constitutional: alert, cooperative and no distress  Respiratory: breathing non-labored at rest  Cardiovascular: regular rate and sinus rhythm  Gastrointestinal: Soft, incisional soreness, and non-distended. No rebound/guarding Integumentary: Laparotomy is intact with staples; no erythema; no drainage   Labs:     Latest Ref Rng & Units 12/16/2022    5:45 AM 12/15/2022    4:38 AM 12/14/2022    4:21 AM  CBC  WBC 4.0 - 10.5 K/uL 8.4  9.2  8.0   Hemoglobin 12.0 - 15.0 g/dL 7.8  7.6  7.8   Hematocrit 36.0 - 46.0 % 24.0  23.0  23.8   Platelets 150 - 400 K/uL 228  197  188       Latest Ref Rng & Units 12/15/2022    4:38 AM 12/13/2022    4:25 AM 12/12/2022    6:20 AM  CMP  Glucose 70 - 99 mg/dL 161  096  045   BUN 8 - 23 mg/dL 35  31  27   Creatinine 0.44 - 1.00 mg/dL 4.09  8.11  9.14   Sodium 135 - 145 mmol/L 139  143  145   Potassium 3.5 - 5.1 mmol/L 4.1  3.7  3.2   Chloride 98 - 111 mmol/L 108  111  116   CO2 22 - 32 mmol/L  25  24  25    Calcium 8.9 - 10.3 mg/dL 8.1  8.3  8.5   Total Protein 6.5 - 8.1 g/dL 4.9     Total Bilirubin 0.3 - 1.2 mg/dL 0.4     Alkaline Phos 38 - 126 U/L 56     AST 15 - 41 U/L 39     ALT 0 - 44 U/L 41        Imaging studies: No new pertinent imaging studies   Assessment/Plan:  82 y.o. female 9 Days Post-Op s/p exploratory laparotomy and small bowel resection (120 cms) secondary to internal hernia   - Continue soft diet   - Discontinue TPN today - Monitor abdominal examination; on-going bowel function - Pain control prn; antiemetics prn - Monitor H&H; stable; getting iron; transfuse as needed  - Mobilize; PT - doing better  - Further management per primary service   - Discharge Planning: She is doing well; ready for DC from surgical perspective. She wishes to wait another day, which is reasonable. I will arrange follow up and update instructions. Discharge tomorrow (06/05). Please call with questions or concerns; we  will be available.   All of the above findings and recommendations were discussed with the patient, and the medical team, and all of patient's questions were answered to her expressed satisfaction.  -- Lynden Oxford, PA-C [facility] 12/16/2022, 9:27 AM M-F: 7am - 4pm

## 2022-12-16 NOTE — Progress Notes (Signed)
Mobility Specialist - Progress Note     12/16/22 0900  Mobility  Activity Ambulated with assistance in hallway  Level of Assistance Contact guard assist, steadying assist  Assistive Device Front wheel walker  Distance Ambulated (ft) 60 ft  Range of Motion/Exercises Active  Activity Response Tolerated well  Mobility Referral Yes  $Mobility charge 1 Mobility  Mobility Specialist Start Time (ACUTE ONLY) 0800  Mobility Specialist Stop Time (ACUTE ONLY) 0825  Mobility Specialist Time Calculation (min) (ACUTE ONLY) 25 min   Pt resting in bed on RA upon entry. Pt STS MinA and ambulates to hallway around NS CGA with RW. Pt gait was slow but moderately steady. Pt endorses no pain or lightheadedness. Pt returned to recliner and left with needs in reach; bed alarm activated.   Johnathan Hausen Mobility Specialist 12/16/22, 9:08 AM

## 2022-12-16 NOTE — Plan of Care (Signed)
  Problem: Activity: Goal: Risk for activity intolerance will decrease Outcome: Progressing   

## 2022-12-17 DIAGNOSIS — Z9889 Other specified postprocedural states: Secondary | ICD-10-CM | POA: Diagnosis not present

## 2022-12-17 DIAGNOSIS — R001 Bradycardia, unspecified: Secondary | ICD-10-CM

## 2022-12-17 DIAGNOSIS — D62 Acute posthemorrhagic anemia: Secondary | ICD-10-CM | POA: Diagnosis not present

## 2022-12-17 DIAGNOSIS — K56609 Unspecified intestinal obstruction, unspecified as to partial versus complete obstruction: Secondary | ICD-10-CM | POA: Diagnosis not present

## 2022-12-17 LAB — CBC
HCT: 24.1 % — ABNORMAL LOW (ref 36.0–46.0)
Hemoglobin: 7.9 g/dL — ABNORMAL LOW (ref 12.0–15.0)
MCH: 29.4 pg (ref 26.0–34.0)
MCHC: 32.8 g/dL (ref 30.0–36.0)
MCV: 89.6 fL (ref 80.0–100.0)
Platelets: 246 10*3/uL (ref 150–400)
RBC: 2.69 MIL/uL — ABNORMAL LOW (ref 3.87–5.11)
RDW: 16.8 % — ABNORMAL HIGH (ref 11.5–15.5)
WBC: 7.9 10*3/uL (ref 4.0–10.5)
nRBC: 0 % (ref 0.0–0.2)

## 2022-12-17 LAB — GLUCOSE, CAPILLARY: Glucose-Capillary: 100 mg/dL — ABNORMAL HIGH (ref 70–99)

## 2022-12-17 MED ORDER — ENSURE ENLIVE PO LIQD: 237.0000 mL | Freq: Three times a day (TID) | ORAL | 12 refills | Status: AC

## 2022-12-17 MED ORDER — PANTOPRAZOLE SODIUM 40 MG PO TBEC: 40.0000 mg | DELAYED_RELEASE_TABLET | Freq: Two times a day (BID) | ORAL | 0 refills | Status: DC

## 2022-12-17 MED ORDER — VITAMIN D 25 MCG (1000 UNIT) PO TABS: 1000.0000 [IU] | ORAL_TABLET | Freq: Every day | ORAL | Status: AC

## 2022-12-17 NOTE — Progress Notes (Signed)
Physical Therapy Treatment Patient Details Name: Melanie Gray MRN: [id number] DOB: 12/28/1940 Today's Date: 12/17/2022   History of Present Illness Pt admitted for SBO and is s/p exploratory laparotomy on 12/08/22. History includes DM, HTN, CKD, HLD, depression, and MS.    PT Comments    Patient in recliner on arrival and agreeable to PT treatment session. Patient able to complete sit to stand from recliner and [facility] with minA for balance while patient transitions hands to RW. Ambulated 220' with RW and min guard with no LOB noted. Patient eager to discharge home but anxious about returning home due to current functional level. Discharge plan remains appropriate.     Recommendations for follow up therapy are one component of a multi-disciplinary discharge planning process, led by the attending physician.  Recommendations may be updated based on patient status, additional functional criteria and insurance authorization.  Follow Up Recommendations  Can patient physically be transported by private vehicle: Yes    Assistance Recommended at Discharge Frequent or constant Supervision/Assistance  Patient can return home with the following A lot of help with walking and/or transfers;Assist for transportation;Help with stairs or ramp for entrance;A lot of help with bathing/dressing/bathroom   Equipment Recommendations  Rolling [name] (2 wheels);BSC/3in1    Recommendations for Other Services       Precautions / Restrictions Precautions Precautions: Fall Precaution Comments: abdominal incision; R PICC Restrictions Weight Bearing Restrictions: No     Mobility  Bed Mobility               General bed mobility comments: in recliner on arrival and at end of session    Transfers Overall transfer level: Needs assistance Equipment used: Rolling [name] (2 wheels) Transfers: Sit to/from Stand Sit to Stand: Min assist           General transfer comment: assist for balance as patient  places hands on RW    Ambulation/Gait Ambulation/Gait assistance: Min guard Gait Distance (Feet): 220 Feet Assistive device: Rolling [name] (2 wheels) Gait Pattern/deviations: Step-through pattern, Decreased step length - right, Decreased step length - left, Decreased stance time - right, Decreased stance time - left, Decreased stride length, Shuffle, Trunk flexed, Narrow base of support Gait velocity: decreased     General Gait Details: min guard for safety. Decreased stance time on R LE   Stairs             Wheelchair Mobility    Modified Rankin (Stroke Patients Only)       Balance Overall balance assessment: Needs assistance, History of Falls Sitting-balance support: No upper extremity supported, Feet supported Sitting balance-Leahy Scale: Good     Standing balance support: Bilateral upper extremity supported, Reliant on assistive device for balance, During functional activity Standing balance-Leahy Scale: Good                              Cognition Arousal/Alertness: Awake/alert Behavior During Therapy: WFL for tasks assessed/performed Overall Cognitive Status: Within Functional Limits for tasks assessed                                          Exercises      General Comments        Pertinent Vitals/Pain Pain Assessment Pain Assessment: Faces Faces Pain Scale: Hurts a little bit Pain Location: abdominal discomfort from gas Pain Descriptors /  Indicators: Discomfort Pain Intervention(s): Monitored during session    Home Living                          Prior Function            PT Goals (current goals can now be found in the care plan section) Acute Rehab PT Goals Patient Stated Goal: to get stronger PT Goal Formulation: With patient Time For Goal Achievement: 12/23/22 Potential to Achieve Goals: Good Progress towards PT goals: Progressing toward goals    Frequency    Min 3X/week      PT Plan  Current plan remains appropriate    Co-evaluation              AM-PAC PT "6 Clicks" Mobility   Outcome Measure  Help needed turning from your back to your side while in a flat bed without using bedrails?: A Little Help needed moving from lying on your back to sitting on the side of a flat bed without using bedrails?: A Little Help needed moving to and from a bed to a chair (including a wheelchair)?: A Little Help needed standing up from a chair using your arms (e.g., wheelchair or bedside chair)?: A Little Help needed to walk in hospital room?: A Little Help needed climbing 3-5 steps with a railing? : A Lot 6 Click Score: 17    End of Session Equipment Utilized During Treatment: Gait belt Activity Tolerance: Patient tolerated treatment well Patient left: in chair;with call bell/phone within reach;with chair alarm set Nurse Communication: Mobility status;Precautions PT Visit Diagnosis: Unsteadiness on feet (R26.81);Repeated falls (R29.6);Muscle weakness (generalized) (M62.81);History of falling (Z91.81);Difficulty in walking, not elsewhere classified (R26.2);Pain     Time: 1505-1530 PT Time Calculation (min) (ACUTE ONLY): 25 min  Charges:  $Therapeutic Activity: 23-37 mins                     Maylon Peppers, PT, DPT Physical Therapist - [facility]  [facility]    [name] 12/17/2022, 3:48 PM

## 2022-12-17 NOTE — Discharge Summary (Signed)
Physician Discharge Summary   Patient: Melanie Gray MRN: [id number] DOB: 1940-07-26  Admit date:     12/07/2022  Discharge date: 12/17/22  Discharge Physician: Delfino Lovett   PCP: Alba Cory, MD   Recommendations at discharge:    F/up with outpt providers as requested  Discharge Diagnoses: Principal Problem:   Small bowel obstruction (HCC) Active Problems:   S/P laparotomy   Sinus bradycardia   Benign hypertension   Chronic anemia   Chronic kidney disease, stage 3a (HCC)   Type 2 diabetes mellitus with renal complication (HCC)   Dyslipidemia   SBO (small bowel obstruction) (HCC)   Malnutrition of moderate degree   Hypernatremia   Hypophosphatemia   Lactic acidosis   Acute blood loss anemia   Small bowel ischemia (HCC)   Hypokalemia  Hospital Course: Marbella Watchman is a 82 y.o. female with past medical history of Medical consultation for medical management on 82 year old female who is s/p emergent laparotomy for small bowel obstruction. Past medical history significant for DM, HTN, CKD 3A, HLD, depression, urge incontinence and osteoarthritis. Patient underwent partial small bowel resection on 5/27, patient developed post op ileus, was started on NG suction.  She is also started on TPN. Patient started having bowel movement 5/31, diet started.  NG tube removed.  Assessment and Plan: * Small bowel obstruction (HCC) S/p laparotomy 12/08/2022 and small bowel resection (120 cms) due to internal hernia Initially needed TPN and now tolerating soft diet.  Sinus bradycardia Patient had sinus bradycardia postoperatively mid 40s to 50s, improving to the 60s when awakened Resolved now.  Dyslipidemia Type 2 diabetes mellitus with renal complication (HCC) Chronic kidney disease, stage 3a (HCC) Chronic anemia Benign hypertension           Consultants: Surgery Procedures performed: S/p laparotomy and small bowel resection (120 cms) due to internal hernia on 12/08/2022   Disposition: Home health Diet recommendation:  Discharge Diet Orders (From admission, onward)     Start     Ordered   12/17/22 0000  Diet - low sodium heart healthy        12/17/22 1151           Carb modified diet DISCHARGE MEDICATION: Allergies as of 12/17/2022       Reactions   Sulfa Antibiotics Rash        Medication List     STOP taking these medications    telmisartan 40 MG tablet Commonly known as: MICARDIS       TAKE these medications    acetaminophen 500 MG tablet Commonly known as: TYLENOL Take 1,000 mg by mouth daily as needed for moderate pain or headache.   amLODipine 2.5 MG tablet Commonly known as: NORVASC Take 1 tablet (2.5 mg total) by mouth daily.   aspirin EC 81 MG tablet Take 81 mg by mouth daily.   atorvastatin 40 MG tablet Commonly known as: LIPITOR Take 1 tablet (40 mg total) by mouth daily.   cholecalciferol 25 MCG (1000 UNIT) tablet Commonly known as: VITAMIN D3 Take 1 tablet (1,000 Units total) by mouth daily. What changed: when to take this   diclofenac Sodium 1 % Gel Commonly known as: Voltaren Apply 4 g topically 4 (four) times daily. What changed:  when to take this reasons to take this   feeding supplement Liqd Take 237 mLs by mouth 3 (three) times daily between meals.   Multi-Vitamins Tabs Take 1 tablet by mouth every evening.   OSTEO BI-FLEX REGULAR STRENGTH PO Take 1  tablet by mouth daily.   pantoprazole 40 MG tablet Commonly known as: PROTONIX Take 1 tablet (40 mg total) by mouth 2 (two) times daily.   SYSTANE OP Apply 1 drop to eye daily as needed (dry eyes).        Follow-up Information     Donovan Kail, PA-C. Go in 1 week(s).   Specialty: Physician Assistant Why: Follow up in 7-10 days; exploratory laparotomy; has staples  appt. on 06/13 1:30 pm Contact information: [address] [place] [address] [phone]         Alba Cory, MD. Schedule an appointment  as soon as possible for a visit in 1 week(s).   Specialty: Family Medicine Why: [facility] Discharge F/UP Contact information: [address] [place] [address] [phone]                Discharge Exam: Ceasar Mons Weights   12/15/22 0500 12/16/22 0412 12/17/22 0500  Weight: 64.9 kg 65.2 kg 62.2 kg   Constitutional: alert, cooperative and no distress  Respiratory: breathing non-labored at rest  Cardiovascular: regular rate and sinus rhythm  Gastrointestinal: Soft, incisional soreness, and non-distended. No rebound/guarding Integumentary: Laparotomy is intact with staples; no erythema; no drainage   Condition at discharge: fair  The results of significant diagnostics from this hospitalization (including imaging, microbiology, ancillary and laboratory) are listed below for reference.   Imaging Studies: DG ABD ACUTE 2+V W 1V CHEST  Result Date: 12/10/2022 CLINICAL DATA:  Postoperative ileus status post enteric tube placement EXAM: DG ABDOMEN ACUTE WITH 1 VIEW CHEST COMPARISON:  Chest radiograph dated 07/06/2016 FINDINGS: Lines/tubes: Gastric/enteric tube tip projects over the stomach. Central venous catheter tip projects over the superior cavoatrial junction. Lungs: Low lung volumes.  Left retrocardiac opacities. Pleura: No pneumothorax or pleural effusion. Heart/mediastinum: The heart size and mediastinal contours are within normal limits. Abdomen: A few loops of dilated left upper quadrant small bowel. Small volume pneumoperitoneum. No abnormal calcification or mass effect. Right upper quadrant surgical clips. Midline abdominal surgical staples. Bones: No acute osseous abnormality. IMPRESSION: 1. Gastric/enteric tube tip projects over the stomach. 2. A few loops of dilated left upper quadrant small bowel, likely postoperative ileus. 3. Small volume pneumoperitoneum, likely postoperative. 4. Left retrocardiac opacities, likely atelectasis. Electronically Signed   By:  Agustin Cree M.D.   On: 12/10/2022 09:04   [place] EKG SITE RITE  Result Date: 12/09/2022 If Site Rite image not attached, placement could not be confirmed due to current cardiac rhythm.  CT ABDOMEN PELVIS W CONTRAST  Result Date: 12/07/2022 CLINICAL DATA:  Abdominal pain, acute, nonlocalized. Abdominal pain that started early this morning. Pt has been vomiting as well. Pt is CAOx4, in no acute distress in wheel chair in triage. EXAM: CT ABDOMEN AND PELVIS WITH CONTRAST TECHNIQUE: Multidetector CT imaging of the abdomen and pelvis was performed using the standard protocol following bolus administration of intravenous contrast. RADIATION DOSE REDUCTION: This exam was performed according to the departmental dose-optimization program which includes automated exposure control, adjustment of the mA and/or kV according to patient size and/or use of iterative reconstruction technique. CONTRAST:  80mL OMNIPAQUE IOHEXOL 300 MG/ML  SOLN COMPARISON:  None Available. FINDINGS: Lower chest: Hepatobiliary: Multiple indeterminate hypodense lesions within the liver that may represent hepatic cysts and possible hepatic hemangioma. No gallstones, gallbladder wall thickening, or pericholecystic fluid. No biliary dilatation. Pancreas: No focal lesion. Normal pancreatic contour. No surrounding inflammatory changes. No main pancreatic ductal dilatation. Spleen: Normal in size  without focal abnormality. Adrenals/Urinary Tract: No adrenal nodule bilaterally. Bilateral kidneys enhance symmetrically. No hydronephrosis. No hydroureter. The urinary bladder is unremarkable. Stomach/Bowel: Stomach is within normal limits. Multiple loops of small bowel within the lower abdomen and pelvis are dilated with fluid measuring up to 2.9 cm in caliber. Associated mesenteric edema and tethering/swirling with likely closed loop obstruction. No pneumatosis. No bowel wall thickening. The small bowel distally is fully decompressed. No evidence of large bowel  wall thickening or dilatation. Appendix appears normal. Vascular/Lymphatic: No abdominal aorta or iliac aneurysm. Mild atherosclerotic plaque of the aorta and its branches. No abdominal, pelvic, or inguinal lymphadenopathy. Reproductive: Status post hysterectomy. No adnexal masses. Other: Trace volume free fluid within the pelvis. No intraperitoneal free gas. No organized fluid collection. Musculoskeletal: No abdominal wall hernia or abnormality. No suspicious lytic or blastic osseous lesions. No acute displaced fracture. Multilevel degenerative changes of the spine. IMPRESSION: 1. Small bowel closed loop obstruction. Underlying internal hernia cannot be excluded. Mesenteric edema with no pneumatosis; however, ischemia not excluded. 2.  Aortic Atherosclerosis (ICD10-I70.0). These results were called by telephone at the time of interpretation on 12/07/2022 at 9:57 pm to provider [facility] , who verbally acknowledged these results. Electronically Signed   By: Tish Frederickson M.D.   On: 12/07/2022 22:00    Microbiology: Results for orders placed or performed during the hospital encounter of 12/07/22  Aerobic/Anaerobic Culture w Gram Stain (surgical/deep wound)     Status: None   Collection Time: 12/07/22  7:00 AM   Specimen: Path fluid; GI  Result Value Ref Range Status   Specimen Description   Final    FLUID Performed at [facility], [address], [place], [place] [address]    Special Requests   Final    NONE Performed at [facility], [address] [place], [place] [address]    Gram Stain NO WBC SEEN NO ORGANISMS SEEN   Final   Culture   Final    No growth aerobically or anaerobically. Performed at [facility], [address], [place], [place] [address]    Report Status 12/13/2022 FINAL  Final    Labs: CBC: Recent Labs  Lab 12/13/22 0425 12/14/22 0421 12/15/22 0438 12/16/22 0545 12/17/22 0536  WBC 7.9 8.0 9.2 8.4 7.9  HGB 8.2* 7.8* 7.6* 7.8*  7.9*  HCT 24.3* 23.8* 23.0* 24.0* 24.1*  MCV 86.8 86.9 87.5 88.2 89.6  PLT 170 188 197 228 246   Basic Metabolic Panel: Recent Labs  Lab 12/11/22 0512 12/12/22 0620 12/13/22 0425 12/15/22 0438  NA 146* 145 143 139  K 3.5 3.2* 3.7 4.1  CL 115* 116* 111 108  CO2 24 25 24 25   GLUCOSE 152* 147* 122* 142*  BUN 29* 27* 31* 35*  CREATININE 0.82 0.76 0.74 0.82  CALCIUM 8.3* 8.5* 8.3* 8.1*  MG 2.2 2.2 2.2 2.0  PHOS 2.2* 3.4 4.4 4.7*   Liver Function Tests: Recent Labs  Lab 12/11/22 0512 12/15/22 0438  AST 48* 39  ALT 23 41  ALKPHOS 49 56  BILITOT 0.8 0.4  PROT 5.4* 4.9*  ALBUMIN 2.8* 2.5*   CBG: Recent Labs  Lab 12/15/22 1718 12/15/22 2311 12/16/22 0516 12/16/22 1142 12/17/22 0819  GLUCAP 120* 106* 104* 97 100*    Discharge time spent: greater than 30 minutes.  Signed: Delfino Lovett, MD [facility] 12/17/2022

## 2022-12-17 NOTE — TOC Transition Note (Signed)
Transition of Care [facility]) - CM/SW Discharge Note   Patient Details  Name: Melanie Gray MRN: [id number] Date of Birth: 04-18-1941  [facility]) CM/SW Contact:  Chapman Fitch, RN Phone Number: 12/17/2022, 12:06 PM   Clinical Narrative:     Patient to discharge today [name] with [facility] notified of discharge RW and [facility] delivered to room by [employer] yesterday      Barriers to Discharge: Continued Medical Work up   Patient Goals and CMS Choice   Choice offered to / list presented to : Patient, Adult Children  Discharge Placement                         Discharge Plan and Services Additional resources added to the After Visit Summary for       Post Acute Care Choice: Home Health                               Social Determinants of Health (SDOH) Interventions SDOH Screenings   Food Insecurity: No Food Insecurity (12/10/2022)  Housing: Low Risk  (12/10/2022)  Transportation Needs: No Transportation Needs (12/10/2022)  Utilities: Not At Risk (12/10/2022)  Alcohol Screen: Low Risk  (12/24/2021)  Depression (PHQ2-9): Low Risk  (08/29/2022)  Financial Resource Strain: Low Risk  (12/24/2021)  Physical Activity: Inactive (12/24/2021)  Social Connections: Socially Isolated (12/24/2021)  Stress: No Stress Concern Present (12/24/2021)  Tobacco Use: Low Risk  (12/08/2022)     Readmission Risk Interventions     No data to display

## 2022-12-18 ENCOUNTER — Telehealth: Payer: Self-pay | Admitting: *Deleted

## 2022-12-18 NOTE — Transitions of Care (Post Inpatient/ED Visit) (Signed)
12/18/2022  Name: Melanie Gray MRN: [id number] DOB: 01-Nov-1940  Today's TOC FU Call Status: Today's TOC FU Call Status:: Successful TOC FU Call Competed TOC FU Call Complete Date: 12/18/22  Transition Care Management Follow-up Telephone Call Date of Discharge: 12/17/22 Discharge Facility: [facility]) Type of Discharge: Inpatient Admission Primary Inpatient Discharge Diagnosis:: Small Bowel Obstruction How have you been since you were released from the hospital?: Better Any questions or concerns?: Yes Patient Questions/Concerns:: Need appt with PCP Patient Questions/Concerns Addressed: Other:  Items Reviewed: Did you receive and understand the discharge instructions provided?: Yes Medications obtained,verified, and reconciled?: Yes (Medications Reviewed) Any new allergies since your discharge?: No Dietary orders reviewed?: Yes Type of Diet Ordered:: Low sodium heart healthy soft diet Do you have support at home?: Yes People in Home: child(ren), adult Name of Support/Comfort Primary Source: dorothy  Medications Reviewed Today: Medications Reviewed Today     Reviewed by Luella Cook, RN (Case Manager) on 12/18/22 at 1404  Med List Status: <None>   Medication Order Taking? Sig Documenting Provider Last Dose Status Informant  acetaminophen (TYLENOL) 500 MG tablet [id number] Yes Take 1,000 mg by mouth daily as needed for moderate pain or headache. [provider] Taking Active   amLODipine (NORVASC) 2.5 MG tablet [id number] Yes Take 1 tablet (2.5 mg total) by mouth daily. Alba Cory, MD Taking Active   aspirin EC 81 MG tablet [id number] Yes Take 81 mg by mouth daily. [provider] Taking Active Self           Med Note Sherrie Mustache, Ronaldo Miyamoto A   Wed Jun 17, 2017  3:44 PM)    atorvastatin (LIPITOR) 40 MG tablet [id number] Yes Take 1 tablet (40 mg total) by mouth daily. Alba Cory, MD Taking Active   cholecalciferol (VITAMIN D3) 25 MCG  (1000 UNIT) tablet [id number] Yes Take 1 tablet (1,000 Units total) by mouth daily. Delfino Lovett, MD Taking Active   diclofenac Sodium (VOLTAREN) 1 % GEL [id number] Yes Apply 4 g topically 4 (four) times daily.  Patient taking differently: Apply 4 g topically 4 (four) times daily as needed (pain).   Alba Cory, MD Taking Active Self  feeding supplement (ENSURE ENLIVE / ENSURE PLUS) LIQD [id number] Yes Take 237 mLs by mouth 3 (three) times daily between meals. Delfino Lovett, MD Taking Active   Glucosamine-Chondroitin (OSTEO BI-FLEX REGULAR STRENGTH PO) [id number] Yes Take 1 tablet by mouth daily. [provider] Taking Active Self           Med Note Mady Gemma, Rosanne Sack D   Fri Sep 17, 2018  9:01 AM)    Multiple Vitamin (MULTI-VITAMINS) TABS [id number] Yes Take 1 tablet by mouth every evening.  [provider] Taking Active Self           Med Note Sherrie Mustache, Ronaldo Miyamoto A   Wed Jun 17, 2017  3:44 PM)    pantoprazole (PROTONIX) 40 MG tablet [id number] Yes Take 1 tablet (40 mg total) by mouth 2 (two) times daily. Delfino Lovett, MD Taking Active   Polyethyl Glycol-Propyl Glycol [facility] OP) [id number] Yes Apply 1 drop to eye daily as needed (dry eyes). [provider] Taking Active Self            Home Care and Equipment/Supplies: Were Home Health Services Ordered?: Yes Name of Home Health Agency:: [facility] Has Agency set up a time to come to your home?: Yes First Home Health Visit Date: 12/18/22 Any new equipment or medical supplies ordered?: Yes Name  of Medical supply agency?: [facility] ([facility], [facility]) Were you able to get the equipment/medical supplies?: Yes Do you have any questions related to the use of the equipment/supplies?: No  Functional Questionnaire: Do you need assistance with bathing/showering or dressing?: Yes Do you need assistance with meal preparation?: Yes Do you need assistance with eating?: No Do you have difficulty maintaining continence: No Do you need assistance with  getting out of bed/getting out of a chair/moving?: No Do you have difficulty managing or taking your medications?: Yes  Follow up appointments reviewed: PCP Follow-up appointment confirmed?: Yes Date of PCP follow-up appointment?: 12/23/22 Follow-up Provider: Danelle Berry [id number] Specialist Hospital Follow-up appointment confirmed?: Yes Date of Specialist follow-up appointment?: 12/25/22 Follow-Up Specialty Provider:: Dub Amis [id number] 1:30, [id number] Lynden Oxford Do you need transportation to your follow-up appointment?: No Do you understand care options if your condition(s) worsen?: Yes-patient verbalized understanding  SDOH Interventions Today    Flowsheet Row Most Recent Value  SDOH Interventions   Food Insecurity Interventions Intervention Not Indicated  Housing Interventions Intervention Not Indicated  Transportation Interventions Intervention Not Indicated, Patient Resources (Friends/Family)      Interventions Today    Flowsheet Row Most Recent Value  General Interventions   General Interventions Discussed/Reviewed General Interventions Discussed, Doctor Visits  Nutrition Interventions   Nutrition Discussed/Reviewed Nutrition Discussed, Nutrition Reviewed  [discussed what to eat in a soft diet]  Pharmacy Interventions   Pharmacy Dicussed/Reviewed Pharmacy Topics Discussed, Pharmacy Topics Reviewed      TOC Interventions Today    Flowsheet Row Most Recent Value  TOC Interventions   TOC Interventions Discussed/Reviewed TOC Interventions Discussed, TOC Interventions Reviewed, Arranged PCP follow up within 7 days/Care Guide scheduled       Gean Maidens BSN RN [facility] [phone]

## 2022-12-19 DIAGNOSIS — M1990 Unspecified osteoarthritis, unspecified site: Secondary | ICD-10-CM | POA: Diagnosis not present

## 2022-12-19 DIAGNOSIS — E871 Hypo-osmolality and hyponatremia: Secondary | ICD-10-CM | POA: Diagnosis not present

## 2022-12-19 DIAGNOSIS — Z9181 History of falling: Secondary | ICD-10-CM | POA: Diagnosis not present

## 2022-12-19 DIAGNOSIS — Z48815 Encounter for surgical aftercare following surgery on the digestive system: Secondary | ICD-10-CM | POA: Diagnosis not present

## 2022-12-19 DIAGNOSIS — F32A Depression, unspecified: Secondary | ICD-10-CM | POA: Diagnosis not present

## 2022-12-19 DIAGNOSIS — E876 Hypokalemia: Secondary | ICD-10-CM | POA: Diagnosis not present

## 2022-12-19 DIAGNOSIS — K5669 Other partial intestinal obstruction: Secondary | ICD-10-CM | POA: Diagnosis not present

## 2022-12-19 DIAGNOSIS — Z556 Problems related to health literacy: Secondary | ICD-10-CM | POA: Diagnosis not present

## 2022-12-19 DIAGNOSIS — E1122 Type 2 diabetes mellitus with diabetic chronic kidney disease: Secondary | ICD-10-CM | POA: Diagnosis not present

## 2022-12-19 DIAGNOSIS — I129 Hypertensive chronic kidney disease with stage 1 through stage 4 chronic kidney disease, or unspecified chronic kidney disease: Secondary | ICD-10-CM | POA: Diagnosis not present

## 2022-12-19 DIAGNOSIS — N3941 Urge incontinence: Secondary | ICD-10-CM | POA: Diagnosis not present

## 2022-12-19 DIAGNOSIS — D62 Acute posthemorrhagic anemia: Secondary | ICD-10-CM | POA: Diagnosis not present

## 2022-12-19 DIAGNOSIS — E872 Acidosis, unspecified: Secondary | ICD-10-CM | POA: Diagnosis not present

## 2022-12-19 DIAGNOSIS — Z993 Dependence on wheelchair: Secondary | ICD-10-CM | POA: Diagnosis not present

## 2022-12-19 DIAGNOSIS — K567 Ileus, unspecified: Secondary | ICD-10-CM | POA: Diagnosis not present

## 2022-12-19 DIAGNOSIS — E785 Hyperlipidemia, unspecified: Secondary | ICD-10-CM | POA: Diagnosis not present

## 2022-12-19 DIAGNOSIS — N1831 Chronic kidney disease, stage 3a: Secondary | ICD-10-CM | POA: Diagnosis not present

## 2022-12-19 DIAGNOSIS — Z7982 Long term (current) use of aspirin: Secondary | ICD-10-CM | POA: Diagnosis not present

## 2022-12-19 DIAGNOSIS — E440 Moderate protein-calorie malnutrition: Secondary | ICD-10-CM | POA: Diagnosis not present

## 2022-12-22 ENCOUNTER — Ambulatory Visit: Payer: Medicare Other | Admitting: Podiatry

## 2022-12-22 ENCOUNTER — Encounter: Payer: Self-pay | Admitting: Family Medicine

## 2022-12-22 DIAGNOSIS — E871 Hypo-osmolality and hyponatremia: Secondary | ICD-10-CM | POA: Diagnosis not present

## 2022-12-22 DIAGNOSIS — E1122 Type 2 diabetes mellitus with diabetic chronic kidney disease: Secondary | ICD-10-CM | POA: Diagnosis not present

## 2022-12-22 DIAGNOSIS — N1831 Chronic kidney disease, stage 3a: Secondary | ICD-10-CM | POA: Diagnosis not present

## 2022-12-22 DIAGNOSIS — E440 Moderate protein-calorie malnutrition: Secondary | ICD-10-CM | POA: Diagnosis not present

## 2022-12-22 DIAGNOSIS — Z48815 Encounter for surgical aftercare following surgery on the digestive system: Secondary | ICD-10-CM | POA: Diagnosis not present

## 2022-12-22 DIAGNOSIS — I129 Hypertensive chronic kidney disease with stage 1 through stage 4 chronic kidney disease, or unspecified chronic kidney disease: Secondary | ICD-10-CM | POA: Diagnosis not present

## 2022-12-22 NOTE — Progress Notes (Unsigned)
Name: Melanie Gray   MRN: [id number]    DOB: Mar 31, 1941   Date:12/23/2022       Progress Note  Chief Complaint  Patient presents with   Hospitalization Follow-up     Subjective:   Melanie Gray is a 82 y.o. female, presents to clinic for TOC/HFU  Here for hospital follow up/transition of care.  Admit date: 12/07/2022 Discharge date: 12/17/2022 Transition of care was initiated previously by frances Pleasant on 12/18/2022 and med changes, diagnosis, specialist follow ups and pts symptoms and condition were all reviewed.  Pt was admitted for SBO New medications started per hospitalization include none  Reviewed d/c summary/plan per chart: s/p emergent laparotomy for small bowel obstruction 12/08/2022, partial small bowel resection on 5/27, post op ileus then put on NG suction and on TPN Normal BM on 5/31 and diet advanced and NG tube was removed   Discharge Diagnoses: Principal Problem:   Small bowel obstruction (HCC) Active Problems:   S/P laparotomy   Sinus bradycardia   Benign hypertension   Chronic anemia   Chronic kidney disease, stage 3a (HCC)   Type 2 diabetes mellitus with renal complication (HCC)   Dyslipidemia   SBO (small bowel obstruction) (HCC)   Malnutrition of moderate degree   Hypernatremia   Hypophosphatemia   Lactic acidosis   Acute blood loss anemia   Small bowel ischemia (HCC)   Hypokalemia  TOC call - Transition Care Management Follow-up Telephone Call Date of Discharge: 12/17/22 Discharge Facility: [facility]) Type of Discharge: Inpatient Admission Primary Inpatient Discharge Diagnosis:: Small Bowel Obstruction How have you been since you were released from the hospital?: Better Any questions or concerns?: Yes Patient Questions/Concerns:: Need appt with PCP Patient Questions/Concerns Addressed: Other:   Items Reviewed: Did you receive and understand the discharge instructions provided?: Yes Medications obtained,verified,  and reconciled?: Yes (Medications Reviewed) Any new allergies since your discharge?: No Dietary orders reviewed?: Yes Type of Diet Ordered:: Low sodium heart healthy soft diet Do you have support at home?: Yes People in Home: child(ren), adult Name of Support/Comfort Primary Source: Melanie Gray  D/c Home after prolonged hospital stay with TPN [name] with [facility] notified of discharge - pt worked with pt and has RW and BSC   LAST INPT progress note: Assessment and Plan: Small bowel obstruction (HCC) S/p laparotomy 12/08/2022 Postop ileus. Lactic acidosis. Bowel ischemia status post bowel resection. Patient had a small bowel resection.  Patient was started on TPN after the PICC line was placed.   Patient had a significant lactic acidosis of 4.7, this appears to be secondary to small bowel ischemia which was identified from surgery, the bowel has been resected. Patient condition has improved, had multiple bowel movements.  TPN discontinued, patient placed on soft diet.  Discussed with general surgery, they prefer patient stay another day'  can be discharged tomorrow if condition stable. She will go home with [facility], not going to SNF as recommended by PT.    Hypernatremia Hypophosphatemia. Chronic kidney disease stage IIIa. Hypokalemia. Condition all improved.   Acute blood loss anemia. Hemoglobin dropped down to 6.5, general surgery ordered 2 units of PRBC, ordered 20 mg IV Lasix posttransfusion.  no iron or B12 deficiency. Discussed with general surgery, some rectal bleeding is expected.   Patient received IV iron yesterday, hemoglobin is low but stable.  Recheck a CBC tomorrow.  Patient on twice daily PPI, but does not seem to have any GI bleed.  Currently, bowel has cleared  up, no additional black stool.   Sinus bradycardia Stable.   Diabetes mellitus with renal manifestation (HCC) Continue sliding scale insulin.    Benign hypertension BP well controlled   F/up  today- D/c instructions say both to stopped her telmisartan and say to resume all home medications pt and her daughter today state they have continued both meds - BP at goal and she is not having any lightheadedness or near syncope - no low BP readings at home BP Readings from Last 3 Encounters:  12/23/22 116/74  12/17/22 106/65  08/29/22 (!) 144/86  Some sinus brady which resolved Pulse Readings from Last 3 Encounters:  12/23/22 84  12/17/22 80  08/29/22 92   She is due to f/up with surgical PA on 6/13 for staple removal Incision today clean dry intact, BM a little constipated   Post op anemia - hgb  Admission 12/07/2022 hgb 11.6, at discharge 7.9 (lowest 7.6) eGFR at admission 52, d/c >60  Pt at home, lives with her son, her daughter from out of town is here today and has been staying with her.  HH RN, PT and OT are coming out She is already getting a little stronger, getting up to use the bedside comode on her own, she did make her own bed this am w/o help and noted getting a little winded  She is eating foods low in fiber - eggs, yogurt and doing boost/ensure 3x a day A little constipated Incision is sore, trying not to strain on toilet LE edema is baseline, urinary frequency is at her baseline No cough, wheeze, SOB, sweats, fever, N, V, blood in stool  Daughter asks questions about diet, progression of diet (at d/c noted resume regular diet), deficiencies from part of intestines removed - reviewed sugery/anatomy and pt is unlikely to have any malabsorption or issues with small section removed from ischemia     Wt Readings from Last 5 Encounters:  12/23/22 128 lb 8 oz (58.3 kg)  12/17/22 137 lb 2 oz (62.2 kg)  08/29/22 137 lb (62.1 kg)  02/26/22 138 lb (62.6 kg)  08/28/21 136 lb (61.7 kg)   BMI Readings from Last 5 Encounters:  12/23/22 21.38 kg/m  12/17/22 22.82 kg/m  08/29/22 22.80 kg/m  02/26/22 22.27 kg/m  08/28/21 21.95 kg/m      Current Outpatient  Medications:    acetaminophen (TYLENOL) 500 MG tablet, Take 1,000 mg by mouth daily as needed for moderate pain or headache., Disp: , Rfl:    amLODipine (NORVASC) 2.5 MG tablet, Take 1 tablet (2.5 mg total) by mouth daily., Disp: 90 tablet, Rfl: 1   aspirin EC 81 MG tablet, Take 81 mg by mouth daily., Disp: , Rfl:    atorvastatin (LIPITOR) 40 MG tablet, Take 1 tablet (40 mg total) by mouth daily., Disp: 90 tablet, Rfl: 1   cholecalciferol (VITAMIN D3) 25 MCG (1000 UNIT) tablet, Take 1 tablet (1,000 Units total) by mouth daily., Disp: , Rfl:    diclofenac Sodium (VOLTAREN) 1 % GEL, Apply 4 g topically 4 (four) times daily. (Patient taking differently: Apply 4 g topically 4 (four) times daily as needed (pain).), Disp: 300 g, Rfl: 1   feeding supplement (ENSURE ENLIVE / ENSURE PLUS) LIQD, Take 237 mLs by mouth 3 (three) times daily between meals., Disp: 237 mL, Rfl: 12   Glucosamine-Chondroitin (OSTEO BI-FLEX REGULAR STRENGTH PO), Take 1 tablet by mouth daily., Disp: , Rfl:    Multiple Vitamin (MULTI-VITAMINS) TABS, Take 1 tablet by mouth every  evening. , Disp: , Rfl:    pantoprazole (PROTONIX) 40 MG tablet, Take 1 tablet (40 mg total) by mouth 2 (two) times daily., Disp: 60 tablet, Rfl: 0   Polyethyl Glycol-Propyl Glycol (SYSTANE OP), Apply 1 drop to eye daily as needed (dry eyes)., Disp: , Rfl:    polyethylene glycol powder (GLYCOLAX/MIRALAX) 17 GM/SCOOP powder, Take 8.5-17 g by mouth daily as needed for moderate constipation. Until daily soft stools  OTC, Disp: 578 g, Rfl: 0  Patient Active Problem List   Diagnosis Date Noted   Hypokalemia 12/12/2022   Acute blood loss anemia 12/11/2022   Small bowel ischemia (HCC) 12/11/2022   Malnutrition of moderate degree 12/10/2022   SBO (small bowel obstruction) (HCC) 12/08/2022   S/P laparotomy 12/08/2022   Sinus bradycardia 12/08/2022   Pain due to onychomycosis of toenails of both feet 08/30/2020   Injury of toenail of right foot 08/30/2020   Edema  of lower extremity 12/05/2019   Depression, major, recurrent, in remission (HCC) 08/10/2017   Varicose veins of both lower extremities with inflammation 12/01/2016   Chronic venous insufficiency 12/01/2016   Benign hypertension 11/06/2014   Chronic anemia 11/06/2014   Seborrheic dermatitis 11/06/2014   Type 2 diabetes mellitus with renal complication (HCC) 11/06/2014   Dyslipidemia 11/06/2014   Cephalalgia 11/06/2014   Deafness, sensorineural 11/06/2014   Arthritis of knee, degenerative 11/06/2014   Panic attack 11/06/2014   Restless legs syndrome 11/06/2014   Allergic rhinitis 11/06/2014   Spinal stenosis 11/06/2014   Osteopenia 05/09/2009   Chronic kidney disease, stage 3a (HCC) 04/20/2008    Past Surgical History:  Procedure Laterality Date   ABDOMINAL HYSTERECTOMY     BOWEL RESECTION N/A 12/07/2022   Procedure: SMALL BOWEL RESECTION;  Surgeon: Leafy Ro, MD;  Location: [facility] ORS;  Service: General;  Laterality: N/A;   CATARACT EXTRACTION W/PHACO Left 07/16/2017   Procedure: CATARACT EXTRACTION PHACO AND INTRAOCULAR LENS PLACEMENT (IOC);  Surgeon: [name], MD;  Location: [facility] ORS;  Service: Ophthalmology;  Laterality: Left;  [place] 01:27.0 AP% 21.0 CDE 18.28 Fluid Pack Lot # [id number]   CHOLECYSTECTOMY     HERNIA REPAIR     LAPAROTOMY N/A 12/07/2022   Procedure: EXPLORATORY LAPAROTOMY;  Surgeon: Leafy Ro, MD;  Location: [facility] ORS;  Service: General;  Laterality: N/A;    Family History  Problem Relation Age of Onset   Cancer Mother    Diabetes Father    Cancer Sister    Breast cancer Neg Hx     Social History   Tobacco Use   Smoking status: Never   Smokeless tobacco: Never   Tobacco comments:    smoking cessation materials not required  Vaping Use   Vaping Use: Never used  Substance Use Topics   Alcohol use: No    Alcohol/week: 0.0 standard drinks of alcohol   Drug use: No     Allergies  Allergen Reactions   Sulfa Antibiotics Rash    Health  Maintenance  Topic Date Due   Diabetic kidney evaluation - Urine ACR  08/21/2020   MAMMOGRAM  11/22/2020   Medicare Annual Wellness (AWV)  12/25/2022   OPHTHALMOLOGY EXAM  12/23/2022 (Originally 07/23/2018)   COVID-19 Vaccine (6 - 2023-24 season) 01/08/2023 (Originally 03/14/2022)   INFLUENZA VACCINE  02/12/2023   HEMOGLOBIN A1C  02/27/2023   FOOT EXAM  06/17/2023   Diabetic kidney evaluation - eGFR measurement  12/15/2023   Pneumonia Vaccine 77+ Years old  Completed   DEXA SCAN  Completed   Zoster Vaccines- Shingrix  Completed   HPV VACCINES  Aged Out   DTaP/Tdap/Td  Discontinued    Chart Review Today: I personally reviewed active problem list, medication list, allergies, family history, social history, health maintenance, notes from last encounter, lab results, imaging with the patient/caregiver today.   Review of Systems  Constitutional: Negative.   HENT: Negative.    Eyes: Negative.   Respiratory: Negative.    Cardiovascular: Negative.   Gastrointestinal: Negative.   Endocrine: Negative.   Genitourinary: Negative.   Musculoskeletal: Negative.   Skin: Negative.   Allergic/Immunologic: Negative.   Neurological: Negative.   Hematological: Negative.   Psychiatric/Behavioral: Negative.    All other systems reviewed and are negative.    Objective:   Vitals:   12/23/22 1411  BP: 116/74  Pulse: 84  Resp: 14  Temp: 97.7 F (36.5 C)  TempSrc: Oral  SpO2: 100%  Weight: 128 lb 8 oz (58.3 kg)  Height: 5\' 5"  (1.651 m)    Body mass index is 21.38 kg/m.  Physical Exam Vitals and nursing note reviewed.  Constitutional:      General: She is not in acute distress.    Appearance: Normal appearance. She is well-developed and well-groomed. She is not ill-appearing, toxic-appearing or diaphoretic.     Comments: Elderly, well appearing  HENT:     Head: Normocephalic and atraumatic.     Right Ear: External ear normal.     Left Ear: External ear normal.     Nose: Nose  normal.     Mouth/Throat:     Mouth: Mucous membranes are moist.     Pharynx: Oropharynx is clear. No oropharyngeal exudate.  Eyes:     General: No scleral icterus.       Right eye: No discharge.        Left eye: No discharge.     Conjunctiva/sclera: Conjunctivae normal.  Cardiovascular:     Rate and Rhythm: Normal rate and regular rhythm.     Pulses: Normal pulses.     Heart sounds: Normal heart sounds.  Pulmonary:     Effort: Pulmonary effort is normal. No respiratory distress.     Breath sounds: Normal breath sounds. No stridor. No wheezing or rales.  Abdominal:     General: Abdomen is flat. A surgical scar is present. Bowel sounds are decreased. There is no distension.     Palpations: Abdomen is soft.     Comments: Midline abdominal incision with staples present, incision is clean, dry, intact w/o edema or erythema Lower scar has bandage covering Slightly hypoactive BS throughout No distension, abd soft  Musculoskeletal:     Right lower leg: Edema present.     Left lower leg: Edema present.  Skin:    Coloration: Skin is not jaundiced or pale.     Findings: No erythema.  Neurological:     Mental Status: She is alert.     Motor: Weakness (generalized weakness when getting up from chair in exam room) present.     Gait: Gait abnormal (uses rolling walker).  Psychiatric:        Mood and Affect: Mood normal.        Behavior: Behavior normal. Behavior is cooperative.         Assessment & Plan:     ICD-10-CM   1. Encounter for examination following treatment at hospital  Z09 CBC with Differential/Platelet    COMPLETE METABOLIC PANEL WITH GFR   TOC and HFU done today  2. Chronic kidney insufficiency, stage 3 (moderate) (HCC)  N18.30 COMPLETE METABOLIC PANEL WITH GFR    3. SBO (small bowel obstruction) (HCC)  K56.609    resolved - eating well, normal BM, abd soft    4. Ileus (HCC)  K56.7    post op complication, s/p NG tube, last trigs were normal, still having BM,  slightly slowed the last 2 days    5. Postoperative anemia due to acute blood loss  D62 CBC with Differential/Platelet   will trend H/H, recommend waiting one month to recheck labs, she is symptomatic when trying to do more activity at home - winded, and cold easily    6. Hypokalemia  E87.6    will recheck with next labs    7. Acute blood loss anemia  D62    she had transfusion 2 units PRBC during surgery and iron infusion afterwards, H/H trending up at end of hospital stay, will recheck labs in about a month    8. Malnutrition of moderate degree  E44.0    will monitor nutrition and weight, she is not eating much right now, encourage her to eat foods she enjoys and add the boost/ensure to increase calories/protein    9. S/P laparotomy  Z98.890    staples present - to be removed by surgery on 6/13, C, D, I, incision looks great today, no signs of infection    10. Edema of lower extremity  R60.0    chronic - pt notes she is near her baseline    11. Small bowel ischemia (HCC)  K55.9    portion of ischemic bowel was removed, return of BM after prolonged hospital stay    12. Sinus bradycardia  R00.1    in hospital, none currently    13. Benign hypertension  I10    bp at goal today on her home meds, telmisartan and norvasc    14. Generalized weakness  R53.1    expected with prolonged hospital stay and weight loss, she is working with [facility] PT and OT, already improving    15. Impaired functional mobility, balance, gait, and endurance  Z74.09    she will work with [facility] PT    16. Constipation, unspecified constipation type  K59.00 polyethylene glycol powder (GLYCOLAX/MIRALAX) 17 GM/SCOOP powder   ok to reintroduce fruits/vegetable back into diet, she wishes to eat oatmeal again, reviewed there were no diet restrictions, can use miralax 0.5-1 cap qd prn     Did discuss diet/nutrition extensively. Pt has maintained weight since getting home, appetite is still not her normal.   I did encourage  them to resume normal foods that the patient enjoys, and try 5-6 small meals alternating with the boost and Ensure.  I did roughly calculate her basal metabolic rate and added calories to shoot for goal of 1500 to 1800 cal/day to ensure that she is gaining back a little bit of the weight that she is lost in the last several weeks and mostly to avoid further weight loss or protein calorie malnutrition.  Plan to have her recheck weight and do labs in about one month with PCP. At that time [facility] will likely have been signed off and it would be good to watch her progress regaining weight, strength/mobility, etc and see if there is any need for additional resources to help her with recovery which I explained may be gradual over 3-6 months.  TOC/HFU completed today with daughter Clois Dupes present     Return for 1 month f/up  weight check and do labs .   Danelle Berry, PA-C 12/23/22 5:17 PM

## 2022-12-23 ENCOUNTER — Telehealth: Payer: Self-pay | Admitting: Family Medicine

## 2022-12-23 ENCOUNTER — Encounter: Payer: Self-pay | Admitting: Family Medicine

## 2022-12-23 ENCOUNTER — Ambulatory Visit (INDEPENDENT_AMBULATORY_CARE_PROVIDER_SITE_OTHER): Payer: Medicare Other | Admitting: Family Medicine

## 2022-12-23 VITALS — BP 116/74 | HR 84 | Temp 97.7°F | Resp 14 | Ht 65.0 in | Wt 128.5 lb

## 2022-12-23 DIAGNOSIS — D62 Acute posthemorrhagic anemia: Secondary | ICD-10-CM | POA: Diagnosis not present

## 2022-12-23 DIAGNOSIS — K559 Vascular disorder of intestine, unspecified: Secondary | ICD-10-CM

## 2022-12-23 DIAGNOSIS — R001 Bradycardia, unspecified: Secondary | ICD-10-CM

## 2022-12-23 DIAGNOSIS — E440 Moderate protein-calorie malnutrition: Secondary | ICD-10-CM

## 2022-12-23 DIAGNOSIS — K567 Ileus, unspecified: Secondary | ICD-10-CM | POA: Diagnosis not present

## 2022-12-23 DIAGNOSIS — R600 Localized edema: Secondary | ICD-10-CM

## 2022-12-23 DIAGNOSIS — I10 Essential (primary) hypertension: Secondary | ICD-10-CM

## 2022-12-23 DIAGNOSIS — E1122 Type 2 diabetes mellitus with diabetic chronic kidney disease: Secondary | ICD-10-CM

## 2022-12-23 DIAGNOSIS — Z9889 Other specified postprocedural states: Secondary | ICD-10-CM

## 2022-12-23 DIAGNOSIS — E785 Hyperlipidemia, unspecified: Secondary | ICD-10-CM

## 2022-12-23 DIAGNOSIS — K56609 Unspecified intestinal obstruction, unspecified as to partial versus complete obstruction: Secondary | ICD-10-CM

## 2022-12-23 DIAGNOSIS — Z09 Encounter for follow-up examination after completed treatment for conditions other than malignant neoplasm: Secondary | ICD-10-CM

## 2022-12-23 DIAGNOSIS — R531 Weakness: Secondary | ICD-10-CM

## 2022-12-23 DIAGNOSIS — N183 Chronic kidney disease, stage 3 unspecified: Secondary | ICD-10-CM | POA: Diagnosis not present

## 2022-12-23 DIAGNOSIS — K5900 Constipation, unspecified: Secondary | ICD-10-CM

## 2022-12-23 DIAGNOSIS — E876 Hypokalemia: Secondary | ICD-10-CM

## 2022-12-23 DIAGNOSIS — Z7409 Other reduced mobility: Secondary | ICD-10-CM

## 2022-12-23 MED ORDER — POLYETHYLENE GLYCOL 3350 17 GM/SCOOP PO POWD
8.5000 g | Freq: Every day | ORAL | 0 refills | Status: DC | PRN
Start: 2022-12-23 — End: 2022-12-30

## 2022-12-23 NOTE — Telephone Encounter (Signed)
Home Health Verbal Orders - Caller/Agency: [facility]  Callback Number: [phone] Requesting OT/PT/Skilled Nursing/Social Work/Speech Therapy: PT  Frequency:   2w3   1w5

## 2022-12-23 NOTE — Telephone Encounter (Signed)
Verbal orders given  

## 2022-12-23 NOTE — Patient Instructions (Addendum)
Shoot for 1500-1800 calories per day to help slowly regain weight

## 2022-12-24 DIAGNOSIS — N1831 Chronic kidney disease, stage 3a: Secondary | ICD-10-CM | POA: Diagnosis not present

## 2022-12-24 DIAGNOSIS — E871 Hypo-osmolality and hyponatremia: Secondary | ICD-10-CM | POA: Diagnosis not present

## 2022-12-24 DIAGNOSIS — Z48815 Encounter for surgical aftercare following surgery on the digestive system: Secondary | ICD-10-CM | POA: Diagnosis not present

## 2022-12-24 DIAGNOSIS — E1122 Type 2 diabetes mellitus with diabetic chronic kidney disease: Secondary | ICD-10-CM | POA: Diagnosis not present

## 2022-12-24 DIAGNOSIS — I129 Hypertensive chronic kidney disease with stage 1 through stage 4 chronic kidney disease, or unspecified chronic kidney disease: Secondary | ICD-10-CM | POA: Diagnosis not present

## 2022-12-24 DIAGNOSIS — E440 Moderate protein-calorie malnutrition: Secondary | ICD-10-CM | POA: Diagnosis not present

## 2022-12-25 ENCOUNTER — Telehealth: Payer: Self-pay | Admitting: Family Medicine

## 2022-12-25 NOTE — Telephone Encounter (Signed)
Cheryln Manly calling from [facility] is calling to request verbal orders for OT with a frequency of 2 W 1, 1 W 1  Please advise Verbal on VM Esther requesting Urgent CB- Cell  [phone]

## 2022-12-26 DIAGNOSIS — I129 Hypertensive chronic kidney disease with stage 1 through stage 4 chronic kidney disease, or unspecified chronic kidney disease: Secondary | ICD-10-CM | POA: Diagnosis not present

## 2022-12-26 DIAGNOSIS — Z48815 Encounter for surgical aftercare following surgery on the digestive system: Secondary | ICD-10-CM | POA: Diagnosis not present

## 2022-12-26 DIAGNOSIS — E871 Hypo-osmolality and hyponatremia: Secondary | ICD-10-CM | POA: Diagnosis not present

## 2022-12-26 DIAGNOSIS — N1831 Chronic kidney disease, stage 3a: Secondary | ICD-10-CM | POA: Diagnosis not present

## 2022-12-26 DIAGNOSIS — E1122 Type 2 diabetes mellitus with diabetic chronic kidney disease: Secondary | ICD-10-CM | POA: Diagnosis not present

## 2022-12-26 DIAGNOSIS — E440 Moderate protein-calorie malnutrition: Secondary | ICD-10-CM | POA: Diagnosis not present

## 2022-12-30 ENCOUNTER — Ambulatory Visit (INDEPENDENT_AMBULATORY_CARE_PROVIDER_SITE_OTHER): Payer: Medicare Other | Admitting: Physician Assistant

## 2022-12-30 ENCOUNTER — Encounter: Payer: Self-pay | Admitting: Physician Assistant

## 2022-12-30 VITALS — BP 121/79 | HR 85 | Temp 98.7°F | Ht 66.0 in | Wt 125.8 lb

## 2022-12-30 DIAGNOSIS — K56609 Unspecified intestinal obstruction, unspecified as to partial versus complete obstruction: Secondary | ICD-10-CM

## 2022-12-30 DIAGNOSIS — Z09 Encounter for follow-up examination after completed treatment for conditions other than malignant neoplasm: Secondary | ICD-10-CM

## 2022-12-30 NOTE — Progress Notes (Signed)
[facility] POST-OP OFFICE VISIT  12/30/2022  HPI: Maurisha Maruna is a 82 y.o. female 22 days s/p exploratory laparotomy and small bowel resection (120 cms) secondary to internal hernia with Dr Everlene Farrier   She is doing really well given the circumstances No fever, chill, nausea, emesis Minimal abdominal soreness She has decent appetite; craving hamburger Having bowel function Ambulating; working with therapies No other issues   Vital signs: BP 121/79   Pulse 85   Temp 98.7 F (37.1 C) (Oral)   Ht 5\' 6"  (1.676 m)   Wt 125 lb 12.8 oz (57.1 kg)   SpO2 100%   BMI 20.30 kg/m    Physical Exam: Constitutional: Well appearing female, NAD Abdomen: Soft, non-tender, non-distended, no rebound/guarding Skin: Laparotomy is healing well; staples removed. There is a small ~2 cm area inferior which is healing via secondary intention; no erythema.   Assessment/Plan: This is a 82 y.o. female 22 days s/p exploratory laparotomy and small bowel resection (120 cms) secondary to internal hernia with Dr Everlene Farrier    - Pain control prn  - Reviewed wound care recommendation; cover inferior portion of laparotomy with dry gauze; change as need  - Reviewed lifting restrictions; 6 weeks total  - Encouraged nutritional supplementation with protein drinks; she is doing this TID right now  - I will see her again in ~4 weeks; She understands to call with questions/concerns in the interim  -- Lynden Oxford, PA-C [facility] 12/30/2022, 2:31 PM M-F: 7am - 4pm

## 2022-12-30 NOTE — Patient Instructions (Addendum)
Keep the area covered until it closes up. You may remove your bandage for showering. Follow up here in 1 month.  GENERAL POST-OPERATIVE PATIENT INSTRUCTIONS   WOUND CARE INSTRUCTIONS:  Keep a dry clean dressing on the wound if there is drainage. The initial bandage may be removed after 24 hours.  Once the wound has quit draining you may leave it open to air.  If clothing rubs against the wound or causes irritation and the wound is not draining you may cover it with a dry dressing during the daytime.  Try to keep the wound dry and avoid ointments on the wound unless directed to do so.  If the wound becomes bright red and painful or starts to drain infected material that is not clear, please contact your physician immediately.  If the wound is mildly pink and has a thick firm ridge underneath it, this is normal, and is referred to as a healing ridge.  This will resolve over the next 4-6 weeks.  BATHING: You may shower if you have been informed of this by your surgeon. However, Please do not submerge in a tub, hot tub, or pool until incisions are completely sealed or have been told by your surgeon that you may do so.  DIET:  You may eat any foods that you can tolerate.  It is a good idea to eat a high fiber diet and take in plenty of fluids to prevent constipation.  If you do become constipated you may want to take a mild laxative or take ducolax tablets on a daily basis until your bowel habits are regular.  Constipation can be very uncomfortable, along with straining, after recent surgery.  ACTIVITY:  You are encouraged to cough and deep breath or use your incentive spirometer if you were given one, every 15-30 minutes when awake.  This will help prevent respiratory complications and low grade fevers post-operatively if you had a general anesthetic.  You may want to hug a pillow when coughing and sneezing to add additional support to the surgical area, if you had abdominal or chest surgery, which will  decrease pain during these times.  You are encouraged to walk and engage in light activity for the next two weeks.  You should not lift more than 20 pounds for 6 weeks total after surgery as it could put you at increased risk for complications.  Twenty pounds is roughly equivalent to a plastic bag of groceries. At that time- Listen to your body when lifting, if you have pain when lifting, stop and then try again in a few days. Soreness after doing exercises or activities of daily living is normal as you get back in to your normal routine.  MEDICATIONS:  Try to take narcotic medications and anti-inflammatory medications, such as tylenol, ibuprofen, naprosyn, etc., with food.  This will minimize stomach upset from the medication.  Should you develop nausea and vomiting from the pain medication, or develop a rash, please discontinue the medication and contact your physician.  You should not drive, make important decisions, or operate machinery when taking narcotic pain medication.  SUNBLOCK Use sun block to incision area over the next year if this area will be exposed to sun. This helps decrease scarring and will allow you avoid a permanent darkened area over your incision.  QUESTIONS:  Please feel free to call our office if you have any questions, and we will be glad to assist you. [phone]

## 2022-12-31 DIAGNOSIS — Z48815 Encounter for surgical aftercare following surgery on the digestive system: Secondary | ICD-10-CM | POA: Diagnosis not present

## 2022-12-31 DIAGNOSIS — E1122 Type 2 diabetes mellitus with diabetic chronic kidney disease: Secondary | ICD-10-CM | POA: Diagnosis not present

## 2022-12-31 DIAGNOSIS — E440 Moderate protein-calorie malnutrition: Secondary | ICD-10-CM | POA: Diagnosis not present

## 2022-12-31 DIAGNOSIS — N1831 Chronic kidney disease, stage 3a: Secondary | ICD-10-CM | POA: Diagnosis not present

## 2022-12-31 DIAGNOSIS — E871 Hypo-osmolality and hyponatremia: Secondary | ICD-10-CM | POA: Diagnosis not present

## 2022-12-31 DIAGNOSIS — I129 Hypertensive chronic kidney disease with stage 1 through stage 4 chronic kidney disease, or unspecified chronic kidney disease: Secondary | ICD-10-CM | POA: Diagnosis not present

## 2023-01-01 DIAGNOSIS — E440 Moderate protein-calorie malnutrition: Secondary | ICD-10-CM | POA: Diagnosis not present

## 2023-01-01 DIAGNOSIS — N1831 Chronic kidney disease, stage 3a: Secondary | ICD-10-CM | POA: Diagnosis not present

## 2023-01-01 DIAGNOSIS — I129 Hypertensive chronic kidney disease with stage 1 through stage 4 chronic kidney disease, or unspecified chronic kidney disease: Secondary | ICD-10-CM | POA: Diagnosis not present

## 2023-01-01 DIAGNOSIS — E1122 Type 2 diabetes mellitus with diabetic chronic kidney disease: Secondary | ICD-10-CM | POA: Diagnosis not present

## 2023-01-01 DIAGNOSIS — E871 Hypo-osmolality and hyponatremia: Secondary | ICD-10-CM | POA: Diagnosis not present

## 2023-01-01 DIAGNOSIS — Z48815 Encounter for surgical aftercare following surgery on the digestive system: Secondary | ICD-10-CM | POA: Diagnosis not present

## 2023-01-02 DIAGNOSIS — Z48815 Encounter for surgical aftercare following surgery on the digestive system: Secondary | ICD-10-CM | POA: Diagnosis not present

## 2023-01-02 DIAGNOSIS — E440 Moderate protein-calorie malnutrition: Secondary | ICD-10-CM | POA: Diagnosis not present

## 2023-01-02 DIAGNOSIS — E1122 Type 2 diabetes mellitus with diabetic chronic kidney disease: Secondary | ICD-10-CM | POA: Diagnosis not present

## 2023-01-02 DIAGNOSIS — N1831 Chronic kidney disease, stage 3a: Secondary | ICD-10-CM | POA: Diagnosis not present

## 2023-01-02 DIAGNOSIS — I129 Hypertensive chronic kidney disease with stage 1 through stage 4 chronic kidney disease, or unspecified chronic kidney disease: Secondary | ICD-10-CM | POA: Diagnosis not present

## 2023-01-02 DIAGNOSIS — E871 Hypo-osmolality and hyponatremia: Secondary | ICD-10-CM | POA: Diagnosis not present

## 2023-01-06 DIAGNOSIS — Z48815 Encounter for surgical aftercare following surgery on the digestive system: Secondary | ICD-10-CM | POA: Diagnosis not present

## 2023-01-06 DIAGNOSIS — E440 Moderate protein-calorie malnutrition: Secondary | ICD-10-CM | POA: Diagnosis not present

## 2023-01-06 DIAGNOSIS — E871 Hypo-osmolality and hyponatremia: Secondary | ICD-10-CM | POA: Diagnosis not present

## 2023-01-06 DIAGNOSIS — N1831 Chronic kidney disease, stage 3a: Secondary | ICD-10-CM | POA: Diagnosis not present

## 2023-01-06 DIAGNOSIS — I129 Hypertensive chronic kidney disease with stage 1 through stage 4 chronic kidney disease, or unspecified chronic kidney disease: Secondary | ICD-10-CM | POA: Diagnosis not present

## 2023-01-06 DIAGNOSIS — E1122 Type 2 diabetes mellitus with diabetic chronic kidney disease: Secondary | ICD-10-CM | POA: Diagnosis not present

## 2023-01-08 DIAGNOSIS — Z48815 Encounter for surgical aftercare following surgery on the digestive system: Secondary | ICD-10-CM | POA: Diagnosis not present

## 2023-01-08 DIAGNOSIS — E1122 Type 2 diabetes mellitus with diabetic chronic kidney disease: Secondary | ICD-10-CM | POA: Diagnosis not present

## 2023-01-08 DIAGNOSIS — N1831 Chronic kidney disease, stage 3a: Secondary | ICD-10-CM | POA: Diagnosis not present

## 2023-01-08 DIAGNOSIS — I129 Hypertensive chronic kidney disease with stage 1 through stage 4 chronic kidney disease, or unspecified chronic kidney disease: Secondary | ICD-10-CM | POA: Diagnosis not present

## 2023-01-08 DIAGNOSIS — E440 Moderate protein-calorie malnutrition: Secondary | ICD-10-CM | POA: Diagnosis not present

## 2023-01-08 DIAGNOSIS — E871 Hypo-osmolality and hyponatremia: Secondary | ICD-10-CM | POA: Diagnosis not present

## 2023-01-12 DIAGNOSIS — N1831 Chronic kidney disease, stage 3a: Secondary | ICD-10-CM | POA: Diagnosis not present

## 2023-01-12 DIAGNOSIS — E1122 Type 2 diabetes mellitus with diabetic chronic kidney disease: Secondary | ICD-10-CM | POA: Diagnosis not present

## 2023-01-12 DIAGNOSIS — Z48815 Encounter for surgical aftercare following surgery on the digestive system: Secondary | ICD-10-CM | POA: Diagnosis not present

## 2023-01-12 DIAGNOSIS — E871 Hypo-osmolality and hyponatremia: Secondary | ICD-10-CM | POA: Diagnosis not present

## 2023-01-12 DIAGNOSIS — I129 Hypertensive chronic kidney disease with stage 1 through stage 4 chronic kidney disease, or unspecified chronic kidney disease: Secondary | ICD-10-CM | POA: Diagnosis not present

## 2023-01-12 DIAGNOSIS — E440 Moderate protein-calorie malnutrition: Secondary | ICD-10-CM | POA: Diagnosis not present

## 2023-01-18 DIAGNOSIS — Z48815 Encounter for surgical aftercare following surgery on the digestive system: Secondary | ICD-10-CM | POA: Diagnosis not present

## 2023-01-18 DIAGNOSIS — E785 Hyperlipidemia, unspecified: Secondary | ICD-10-CM | POA: Diagnosis not present

## 2023-01-18 DIAGNOSIS — N1831 Chronic kidney disease, stage 3a: Secondary | ICD-10-CM | POA: Diagnosis not present

## 2023-01-18 DIAGNOSIS — E440 Moderate protein-calorie malnutrition: Secondary | ICD-10-CM | POA: Diagnosis not present

## 2023-01-18 DIAGNOSIS — I129 Hypertensive chronic kidney disease with stage 1 through stage 4 chronic kidney disease, or unspecified chronic kidney disease: Secondary | ICD-10-CM | POA: Diagnosis not present

## 2023-01-18 DIAGNOSIS — E871 Hypo-osmolality and hyponatremia: Secondary | ICD-10-CM | POA: Diagnosis not present

## 2023-01-18 DIAGNOSIS — K567 Ileus, unspecified: Secondary | ICD-10-CM | POA: Diagnosis not present

## 2023-01-18 DIAGNOSIS — K5669 Other partial intestinal obstruction: Secondary | ICD-10-CM | POA: Diagnosis not present

## 2023-01-18 DIAGNOSIS — E1122 Type 2 diabetes mellitus with diabetic chronic kidney disease: Secondary | ICD-10-CM | POA: Diagnosis not present

## 2023-01-18 DIAGNOSIS — Z993 Dependence on wheelchair: Secondary | ICD-10-CM | POA: Diagnosis not present

## 2023-01-18 DIAGNOSIS — E876 Hypokalemia: Secondary | ICD-10-CM | POA: Diagnosis not present

## 2023-01-18 DIAGNOSIS — N3941 Urge incontinence: Secondary | ICD-10-CM | POA: Diagnosis not present

## 2023-01-18 DIAGNOSIS — M1990 Unspecified osteoarthritis, unspecified site: Secondary | ICD-10-CM | POA: Diagnosis not present

## 2023-01-18 DIAGNOSIS — Z9181 History of falling: Secondary | ICD-10-CM | POA: Diagnosis not present

## 2023-01-18 DIAGNOSIS — Z7982 Long term (current) use of aspirin: Secondary | ICD-10-CM | POA: Diagnosis not present

## 2023-01-18 DIAGNOSIS — Z556 Problems related to health literacy: Secondary | ICD-10-CM | POA: Diagnosis not present

## 2023-01-18 DIAGNOSIS — F32A Depression, unspecified: Secondary | ICD-10-CM | POA: Diagnosis not present

## 2023-01-18 DIAGNOSIS — D62 Acute posthemorrhagic anemia: Secondary | ICD-10-CM | POA: Diagnosis not present

## 2023-01-18 DIAGNOSIS — E872 Acidosis, unspecified: Secondary | ICD-10-CM | POA: Diagnosis not present

## 2023-01-21 NOTE — Progress Notes (Deleted)
Name: Melanie Gray   MRN: [id number]    DOB: 01-02-1941   Date:01/21/2023       Progress Note  Subjective  Chief Complaint  Follow Up  HPI  HTN: she is taking micardis 40 mg  and norvasc 2.5 mg in the mornings, but she has urinary urgency and incontinence so she skips medications when out of the house. Advised to switch medications to the evening   Diabetes with CKI stage III on ARB, she sees a nephrologist last urine micro was done 06/2022  and it was normal, GFR was 44  . She denies polyphagia, polydipsia or polyuria but has urinary frequency and nocturia. She follows a diabetic diet. HgbA1C has been within normal limits. Glucose is still well controlled   Hyperlipidemia: last LDL was down from 96 to 78, she has been off pravastatin , we sent rosuvastatin but it was too costly and she never filled medication, discussed [facility], she has been taking Atorvastatin for cholesterol now and doing well    Major Depression: she had depression on an off throughout her life, really struggled after her husband died and she lost weight, had lack of motivation for years, but she has been doing well now, she states she is an introvert, she is socializing over the phone. Her son has moved in with her early 2022 because she was afraid after her fall in 2021. He takes her to her appointments.    Varicose veins:  she wears compression stocking hoses now and seems to be helping with lower extremity edema. Stable    Urge incontinence: she switched to decaf , she continues to have urinary frequency and urgency, going on for years, wearing depends, avoids leaving the house to eat with relatives. Discussed medications    OA knee: history of arthroscopic surgery of right knee , she still uses a cane and walks slowly. Right knee has mild effusion intermittently , she walks very slowly but stable   Patient Active Problem List   Diagnosis Date Noted   Hypokalemia 12/12/2022   Acute blood loss anemia 12/11/2022    Small bowel ischemia (HCC) 12/11/2022   Malnutrition of moderate degree 12/10/2022   SBO (small bowel obstruction) (HCC) 12/08/2022   S/P laparotomy 12/08/2022   Sinus bradycardia 12/08/2022   Pain due to onychomycosis of toenails of both feet 08/30/2020   Injury of toenail of right foot 08/30/2020   Edema of lower extremity 12/05/2019   Depression, major, recurrent, in remission (HCC) 08/10/2017   Varicose veins of both lower extremities with inflammation 12/01/2016   Chronic venous insufficiency 12/01/2016   Benign hypertension 11/06/2014   Chronic anemia 11/06/2014   Seborrheic dermatitis 11/06/2014   Type 2 diabetes mellitus with renal complication (HCC) 11/06/2014   Dyslipidemia 11/06/2014   Cephalalgia 11/06/2014   Deafness, sensorineural 11/06/2014   Arthritis of knee, degenerative 11/06/2014   Panic attack 11/06/2014   Restless legs syndrome 11/06/2014   Allergic rhinitis 11/06/2014   Spinal stenosis 11/06/2014   Osteopenia 05/09/2009   Chronic kidney disease, stage 3a (HCC) 04/20/2008    Past Surgical History:  Procedure Laterality Date   ABDOMINAL HYSTERECTOMY     BOWEL RESECTION N/A 12/07/2022   Procedure: SMALL BOWEL RESECTION;  Surgeon: Leafy Ro, MD;  Location: [facility] ORS;  Service: General;  Laterality: N/A;   CATARACT EXTRACTION W/PHACO Left 07/16/2017   Procedure: CATARACT EXTRACTION PHACO AND INTRAOCULAR LENS PLACEMENT (IOC);  Surgeon: [name], MD;  Location: [facility] ORS;  Service: Ophthalmology;  Laterality: Left;  [place] 01:27.0 AP% 21.0 CDE 18.28 Fluid Pack Lot # [id number] H   CHOLECYSTECTOMY     HERNIA REPAIR     LAPAROTOMY N/A 12/07/2022   Procedure: EXPLORATORY LAPAROTOMY;  Surgeon: Leafy Ro, MD;  Location: [facility] ORS;  Service: General;  Laterality: N/A;    Family History  Problem Relation Age of Onset   Cancer Mother    Diabetes Father    Cancer Sister    Breast cancer Neg Hx     Social History   Tobacco Use   Smoking status: Never    Smokeless tobacco: Never   Tobacco comments:    smoking cessation materials not required  Substance Use Topics   Alcohol use: No    Alcohol/week: 0.0 standard drinks of alcohol     Current Outpatient Medications:    acetaminophen (TYLENOL) 500 MG tablet, Take 1,000 mg by mouth daily as needed for moderate pain or headache., Disp: , Rfl:    amLODipine (NORVASC) 2.5 MG tablet, Take 1 tablet (2.5 mg total) by mouth daily., Disp: 90 tablet, Rfl: 1   aspirin EC 81 MG tablet, Take 81 mg by mouth daily., Disp: , Rfl:    atorvastatin (LIPITOR) 40 MG tablet, Take 1 tablet (40 mg total) by mouth daily., Disp: 90 tablet, Rfl: 1   cholecalciferol (VITAMIN D3) 25 MCG (1000 UNIT) tablet, Take 1 tablet (1,000 Units total) by mouth daily., Disp: , Rfl:    diclofenac Sodium (VOLTAREN) 1 % GEL, Apply 4 g topically 4 (four) times daily. (Patient taking differently: Apply 4 g topically 4 (four) times daily as needed (pain).), Disp: 300 g, Rfl: 1   feeding supplement (ENSURE ENLIVE / ENSURE PLUS) LIQD, Take 237 mLs by mouth 3 (three) times daily between meals., Disp: 237 mL, Rfl: 12   Glucosamine-Chondroitin (OSTEO BI-FLEX REGULAR STRENGTH PO), Take 1 tablet by mouth daily., Disp: , Rfl:    Multiple Vitamin (MULTI-VITAMINS) TABS, Take 1 tablet by mouth every evening. , Disp: , Rfl:    pantoprazole (PROTONIX) 40 MG tablet, Take 1 tablet (40 mg total) by mouth 2 (two) times daily., Disp: 60 tablet, Rfl: 0   Polyethyl Glycol-Propyl Glycol (SYSTANE OP), Apply 1 drop to eye daily as needed (dry eyes)., Disp: , Rfl:   Allergies  Allergen Reactions   Sulfa Antibiotics Rash    I personally reviewed active problem list, medication list, allergies, family history, social history, health maintenance with the patient/caregiver today.   ROS  ***  Objective  There were no vitals filed for this visit.  There is no height or weight on file to calculate BMI.  Physical Exam ***   PHQ2/9:    12/23/2022     2:26 PM 08/29/2022   10:44 AM 02/26/2022   11:27 AM 12/24/2021    1:17 PM 08/28/2021   10:56 AM  Depression screen PHQ 2/9  Decreased Interest 1 0 0 0 0  Down, Depressed, Hopeless 1 0 0 0 0  PHQ - 2 Score 2 0 0 0 0  Altered sleeping 0 1 1  0  Tired, decreased energy 1 0 0  0  Change in appetite 0 0 0  0  Feeling bad or failure about yourself  0 1 0  0  Trouble concentrating 0 0 0  0  Moving slowly or fidgety/restless 0 0 0  0  Suicidal thoughts 0 0 0  0  PHQ-9 Score 3 2 1   0  Difficult doing work/chores Not difficult at  all        phq 9 is {gen pos [id number]}   Fall Risk:    08/29/2022   10:41 AM 02/26/2022   11:27 AM 12/24/2021    1:21 PM 08/28/2021   10:56 AM 07/30/2021    2:58 PM  Fall Risk   Falls in the past year? 0 0 0 0 0  Number falls in past yr: 0 0 0 0 0  Injury with Fall? 0 0 0 0 0  Risk for fall due to : Impaired balance/gait Impaired balance/gait No Fall Risks Impaired balance/gait   Follow up Falls prevention discussed Falls prevention discussed Falls prevention discussed Falls prevention discussed Falls evaluation completed      Functional Status Survey:      Assessment & Plan  *** There are no diagnoses linked to this encounter.

## 2023-01-22 ENCOUNTER — Ambulatory Visit: Payer: Medicare Other | Admitting: Family Medicine

## 2023-01-22 DIAGNOSIS — Z48815 Encounter for surgical aftercare following surgery on the digestive system: Secondary | ICD-10-CM | POA: Diagnosis not present

## 2023-01-22 DIAGNOSIS — E440 Moderate protein-calorie malnutrition: Secondary | ICD-10-CM | POA: Diagnosis not present

## 2023-01-22 DIAGNOSIS — N1831 Chronic kidney disease, stage 3a: Secondary | ICD-10-CM | POA: Diagnosis not present

## 2023-01-22 DIAGNOSIS — E1122 Type 2 diabetes mellitus with diabetic chronic kidney disease: Secondary | ICD-10-CM | POA: Diagnosis not present

## 2023-01-22 DIAGNOSIS — I129 Hypertensive chronic kidney disease with stage 1 through stage 4 chronic kidney disease, or unspecified chronic kidney disease: Secondary | ICD-10-CM | POA: Diagnosis not present

## 2023-01-22 DIAGNOSIS — Z1231 Encounter for screening mammogram for malignant neoplasm of breast: Secondary | ICD-10-CM

## 2023-01-22 DIAGNOSIS — E871 Hypo-osmolality and hyponatremia: Secondary | ICD-10-CM | POA: Diagnosis not present

## 2023-01-28 DIAGNOSIS — Z48815 Encounter for surgical aftercare following surgery on the digestive system: Secondary | ICD-10-CM | POA: Diagnosis not present

## 2023-01-28 DIAGNOSIS — E440 Moderate protein-calorie malnutrition: Secondary | ICD-10-CM | POA: Diagnosis not present

## 2023-01-28 DIAGNOSIS — E1122 Type 2 diabetes mellitus with diabetic chronic kidney disease: Secondary | ICD-10-CM | POA: Diagnosis not present

## 2023-01-28 DIAGNOSIS — I129 Hypertensive chronic kidney disease with stage 1 through stage 4 chronic kidney disease, or unspecified chronic kidney disease: Secondary | ICD-10-CM | POA: Diagnosis not present

## 2023-01-28 DIAGNOSIS — E871 Hypo-osmolality and hyponatremia: Secondary | ICD-10-CM | POA: Diagnosis not present

## 2023-01-28 DIAGNOSIS — N1831 Chronic kidney disease, stage 3a: Secondary | ICD-10-CM | POA: Diagnosis not present

## 2023-01-29 ENCOUNTER — Ambulatory Visit: Payer: Medicare Other | Admitting: Physician Assistant

## 2023-01-29 ENCOUNTER — Encounter: Payer: Self-pay | Admitting: Physician Assistant

## 2023-01-29 VITALS — BP 144/85 | HR 80 | Temp 98.0°F | Ht 66.0 in | Wt 129.0 lb

## 2023-01-29 DIAGNOSIS — Z09 Encounter for follow-up examination after completed treatment for conditions other than malignant neoplasm: Secondary | ICD-10-CM

## 2023-01-29 DIAGNOSIS — E440 Moderate protein-calorie malnutrition: Secondary | ICD-10-CM | POA: Diagnosis not present

## 2023-01-29 DIAGNOSIS — I129 Hypertensive chronic kidney disease with stage 1 through stage 4 chronic kidney disease, or unspecified chronic kidney disease: Secondary | ICD-10-CM | POA: Diagnosis not present

## 2023-01-29 DIAGNOSIS — Z48815 Encounter for surgical aftercare following surgery on the digestive system: Secondary | ICD-10-CM | POA: Diagnosis not present

## 2023-01-29 DIAGNOSIS — E1122 Type 2 diabetes mellitus with diabetic chronic kidney disease: Secondary | ICD-10-CM | POA: Diagnosis not present

## 2023-01-29 DIAGNOSIS — K56609 Unspecified intestinal obstruction, unspecified as to partial versus complete obstruction: Secondary | ICD-10-CM

## 2023-01-29 DIAGNOSIS — E871 Hypo-osmolality and hyponatremia: Secondary | ICD-10-CM | POA: Diagnosis not present

## 2023-01-29 DIAGNOSIS — N1831 Chronic kidney disease, stage 3a: Secondary | ICD-10-CM | POA: Diagnosis not present

## 2023-01-29 NOTE — Patient Instructions (Addendum)
You may just keep a Band-Aid over the area. You may shower, but remove the Band-Aid first. Wash and rinse the area well and pat dry and cover again. The area will close up on its own.    Try to eat a high protein diet, you may supplement with protein shakes.    Follow-up with our office as needed.  Please call and ask to speak with a nurse if you develop questions or concerns.

## 2023-01-29 NOTE — Progress Notes (Signed)
[facility] POST-OP OFFICE VISIT  01/29/2023  HPI: Melanie Gray is a 82 y.o. female ~6 weeks s/p exploratory laparotomy and small bowel resection (120 cms) secondary to internal hernia with Dr Melanie Gray  She continues to do remarkably well No complaints of abdominal pain, nausea, emesis She is tolerating PO; bowel movements are normal Only complaints is small, 1 cm, area to the most inferior portion of her laparotomy wound that is healing via secondary intention Ambulating well at baseline No other complaints   Vital signs: BP (!) 144/85   Pulse 80   Temp 98 F (36.7 C)   Ht 5\' 6"  (1.676 m)   Wt 129 lb (58.5 kg)   SpO2 97%   BMI 20.82 kg/m    Physical Exam: Constitutional: Well appearing female, NAD Abdomen: Soft, non-tender, non-distended, no rebound/guarding Skin: Laparotomy is healing well. There is a small <1 x1 cm area inferior which is healing via secondary intention; no erythema. No depth, base is healthy granulation tissue   Assessment/Plan: This is a 82 y.o. female ~6 weeks s/p exploratory laparotomy and small bowel resection (120 cms) secondary to internal hernia with Dr Melanie Gray   - Okay to utilize superficial dressing as needed for small area to inferior portion of her laparotomy that is healing via secondary intention; okay to shower  - Nothing further from our perspective; She has done well   - I will be happy to see her in 3 months or on as needed basis; She understands to call with questions/concerns  -- Melanie Oxford, PA-C [facility] 01/29/2023, 1:58 PM M-F: 7am - 4pm

## 2023-02-02 DIAGNOSIS — N1831 Chronic kidney disease, stage 3a: Secondary | ICD-10-CM | POA: Diagnosis not present

## 2023-02-02 DIAGNOSIS — E440 Moderate protein-calorie malnutrition: Secondary | ICD-10-CM | POA: Diagnosis not present

## 2023-02-02 DIAGNOSIS — Z48815 Encounter for surgical aftercare following surgery on the digestive system: Secondary | ICD-10-CM | POA: Diagnosis not present

## 2023-02-02 DIAGNOSIS — E1122 Type 2 diabetes mellitus with diabetic chronic kidney disease: Secondary | ICD-10-CM | POA: Diagnosis not present

## 2023-02-02 DIAGNOSIS — E871 Hypo-osmolality and hyponatremia: Secondary | ICD-10-CM | POA: Diagnosis not present

## 2023-02-02 DIAGNOSIS — I129 Hypertensive chronic kidney disease with stage 1 through stage 4 chronic kidney disease, or unspecified chronic kidney disease: Secondary | ICD-10-CM | POA: Diagnosis not present

## 2023-02-06 ENCOUNTER — Ambulatory Visit (INDEPENDENT_AMBULATORY_CARE_PROVIDER_SITE_OTHER): Payer: Medicare Other

## 2023-02-06 VITALS — Ht 66.0 in | Wt 129.0 lb

## 2023-02-06 DIAGNOSIS — Z0000 Encounter for general adult medical examination without abnormal findings: Secondary | ICD-10-CM | POA: Diagnosis not present

## 2023-02-06 NOTE — Progress Notes (Signed)
Subjective:   Melanie Gray is a 82 y.o. female who presents for Medicare Annual (Subsequent) preventive examination.  Visit Complete: Virtual  I connected with  [name] on 02/06/23 by a audio enabled telemedicine application and verified that I am speaking with the correct person using two identifiers.  Patient Location: Home  Provider Location: Home Office  I discussed the limitations of evaluation and management by telemedicine. The patient expressed understanding and agreed to proceed.  Vital Signs: Unable to obtain new vitals due to this being a telehealth visit.  Patient Medicare AWV questionnaire was completed by the patient on (not done); I have confirmed that all information answered by patient is correct and no changes since this date.  Review of Systems    Cardiac Risk Factors include: advanced age (>63men, >60 women);diabetes mellitus;dyslipidemia;hypertension;sedentary lifestyle    Objective:    Today's Vitals   02/06/23 1119  Weight: 129 lb (58.5 kg)  Height: 5\' 6"  (1.676 m)   Body mass index is 20.82 kg/m.     02/06/2023   11:30 AM 12/10/2022   11:29 AM 12/07/2022    8:29 PM 12/24/2021    1:19 PM 12/06/2020   10:47 AM 11/15/2019    9:28 AM 09/17/2018    9:07 AM  Advanced Directives  Does Patient Have a Medical Advance Directive? Yes  No No No No No  Type of Estate agent of [place];Living will        Would patient like information on creating a medical advance directive?  No - Patient declined  No - Patient declined Yes (MAU/Ambulatory/Procedural Areas - Information given) No - Patient declined No - Patient declined    Current Medications (verified) Outpatient Encounter Medications as of 02/06/2023  Medication Sig   acetaminophen (TYLENOL) 500 MG tablet Take 1,000 mg by mouth daily as needed for moderate pain or headache.   amLODipine (NORVASC) 2.5 MG tablet Take 1 tablet (2.5 mg total) by mouth daily.   aspirin EC 81 MG tablet Take 81 mg  by mouth daily.   atorvastatin (LIPITOR) 40 MG tablet Take 1 tablet (40 mg total) by mouth daily.   cholecalciferol (VITAMIN D3) 25 MCG (1000 UNIT) tablet Take 1 tablet (1,000 Units total) by mouth daily.   diclofenac Sodium (VOLTAREN) 1 % GEL Apply 4 g topically 4 (four) times daily. (Patient taking differently: Apply 4 g topically 4 (four) times daily as needed (pain).)   feeding supplement (ENSURE ENLIVE / ENSURE PLUS) LIQD Take 237 mLs by mouth 3 (three) times daily between meals.   Glucosamine-Chondroitin (OSTEO BI-FLEX REGULAR STRENGTH PO) Take 1 tablet by mouth daily.   Multiple Vitamin (MULTI-VITAMINS) TABS Take 1 tablet by mouth every evening.    Polyethyl Glycol-Propyl Glycol (SYSTANE OP) Apply 1 drop to eye daily as needed (dry eyes).   pantoprazole (PROTONIX) 40 MG tablet Take 1 tablet (40 mg total) by mouth 2 (two) times daily.   No facility-administered encounter medications on file as of 02/06/2023.    Allergies (verified) Sulfa antibiotics   History: Past Medical History:  Diagnosis Date   Chronic kidney disease    RENAL INSUFF   Complication of anesthesia    HA , N/V   Diabetes mellitus without complication (HCC)    Hyperlipidemia    Hypertension    Knee instability    RIGHT KNEE WEAKNESS   PONV (postoperative nausea and vomiting)    Vertigo    Past Surgical History:  Procedure Laterality Date   ABDOMINAL  HYSTERECTOMY     BOWEL RESECTION N/A 12/07/2022   Procedure: SMALL BOWEL RESECTION;  Surgeon: Leafy Ro, MD;  Location: [facility] ORS;  Service: General;  Laterality: N/A;   CATARACT EXTRACTION W/PHACO Left 07/16/2017   Procedure: CATARACT EXTRACTION PHACO AND INTRAOCULAR LENS PLACEMENT (IOC);  Surgeon: [name], MD;  Location: [facility] OR[facility];  Service: Ophthalmology;  Laterality: Left;  [place] 01:27.0 AP% 21.0 CDE 18.28 Fluid Pack Lot # [id number] H   CHOLECYSTECTOMY     HERNIA REPAIR     LAPAROTOMY N/A 12/07/2022   Procedure: EXPLORATORY LAPAROTOMY;  Surgeon:  Leafy Ro, MD;  Location: [facility] ORS;  Service: General;  Laterality: N/A;   Family History  Problem Relation Age of Onset   Cancer Mother    Diabetes Father    Cancer Sister    Breast cancer Neg Hx    Social History   Socioeconomic History   Marital status: Widowed    Spouse name: Fayrene Fearing   Number of children: 2   Years of education: Not on file   Highest education level: 12th grade  Occupational History    Employer: RETIRED  Tobacco Use   Smoking status: Never    Passive exposure: Never   Smokeless tobacco: Never  Vaping Use   Vaping status: Never Used  Substance and Sexual Activity   Alcohol use: No    Alcohol/week: 0.0 standard drinks of alcohol   Drug use: No   Sexual activity: Not Currently  Other Topics Concern   Not on file  Social History Narrative   Son moved in with her early 2022    Social Determinants of Health   Financial Resource Strain: Low Risk  (02/06/2023)   Overall Financial Resource Strain (CARDIA)    Difficulty of Paying Living Expenses: Not hard at all  Food Insecurity: No Food Insecurity (02/06/2023)   Hunger Vital Sign    Worried About Running Out of Food in the Last Year: Never true    Ran Out of Food in the Last Year: Never true  Transportation Needs: No Transportation Needs (02/06/2023)   PRAPARE - Administrator, Civil Service (Medical): No    Lack of Transportation (Non-Medical): No  Physical Activity: Inactive (02/06/2023)   Exercise Vital Sign    Days of Exercise per Week: 0 days    Minutes of Exercise per Session: 0 min  Stress: No Stress Concern Present (02/06/2023)   [employer] of Occupational Health - Occupational Stress Questionnaire    Feeling of Stress : Not at all  Social Connections: Socially Isolated (02/06/2023)   Social Connection and Isolation Panel [NHANES]    Frequency of Communication with Friends and Family: More than three times a week    Frequency of Social Gatherings with Friends and Family:  Once a week    Attends Religious Services: Never    Database administrator or Organizations: No    Attends Banker Meetings: Never    Marital Status: Widowed    Tobacco Counseling Counseling given: Not Answered   Clinical Intake:  Pre-visit preparation completed: Yes  Pain : No/denies pain     BMI - recorded: 20.82 Nutritional Status: BMI of 19-24  Normal Nutritional Risks: None Diabetes: No  How often do you need to have someone help you when you read instructions, pamphlets, or other written materials from your doctor or pharmacy?: 1 - Never  Interpreter Needed?: No  Comments: son with pt Information entered by :: B.Chadwin Fury,LPN  Activities of Daily Living    02/06/2023   11:31 AM 12/23/2022    2:11 PM  In your present state of health, do you have any difficulty performing the following activities:  Hearing? 0 0  Vision? 0 0  Difficulty concentrating or making decisions? 0 0  Walking or climbing stairs? 1 1  Dressing or bathing? 0 0  Doing errands, shopping? 1 1  Preparing Food and eating ? N   Using the Toilet? N   In the past six months, have you accidently leaked urine? N   Do you have problems with loss of bowel control? N   Managing your Medications? N   Managing your Finances? N   Housekeeping or managing your Housekeeping? N     Patient Care Team: Alba Cory, MD as PCP - General (Family Medicine) [name], MD as Consulting Physician (Ophthalmology) Mosetta Pigeon, MD (Nephrology) Helane Gunther, DPM as Consulting Physician (Podiatry)  Indicate any recent Medical Services you may have received from other than [facility] providers in the past year (date may be approximate).     Assessment:   This is a routine wellness examination for [facility].  Hearing/Vision screen Hearing Screening - Comments:: Adequate hearing Vision Screening - Comments:: Adequate vision with glasses Says has not been in a long time per pt-will  make  Dietary issues and exercise activities discussed:     Goals Addressed             This Visit's Progress    DIET - INCREASE WATER INTAKE   On track    Recommend to drink at least 6-8 8oz glasses of water per day.       Depression Screen    02/06/2023   11:27 AM 12/23/2022    2:26 PM 08/29/2022   10:44 AM 02/26/2022   11:27 AM 12/24/2021    1:17 PM 08/28/2021   10:56 AM 07/30/2021    2:58 PM  PHQ 2/9 Scores  PHQ - 2 Score 0 2 0 0 0 0 0  PHQ- 9 Score  3 2 1   0     Fall Risk    02/06/2023   11:23 AM 08/29/2022   10:41 AM 02/26/2022   11:27 AM 12/24/2021    1:21 PM 08/28/2021   10:56 AM  Fall Risk   Falls in the past year? 0 0 0 0 0  Number falls in past yr: 0 0 0 0 0  Injury with Fall? 0 0 0 0 0  Risk for fall due to : No Fall Risks Impaired balance/gait Impaired balance/gait No Fall Risks Impaired balance/gait  Follow up Education provided;Falls prevention discussed Falls prevention discussed Falls prevention discussed Falls prevention discussed Falls prevention discussed    MEDICARE RISK AT HOME:  Medicare Risk at Home - 02/06/23 1123     Any stairs in or around the home? Yes    If so, are there any without handrails? Yes    Home free of loose throw rugs in walkways, pet beds, electrical cords, etc? Yes    Adequate lighting in your home to reduce risk of falls? Yes    Life alert? Yes    Use of a cane, walker or w/c? Yes   cane   Grab bars in the bathroom? No    Shower chair or bench in shower? No    Elevated toilet seat or a handicapped toilet? Yes             TIMED UP AND  GO:  Was the test performed?  No    Cognitive Function:        02/06/2023   11:36 AM 11/15/2019    9:36 AM 09/17/2018    9:15 AM 09/11/2017   10:52 AM  6CIT Screen  What Year? 0 points 0 points 0 points 0 points  What month? 0 points 0 points 0 points 0 points  What time? 0 points 0 points 0 points 0 points  Count back from 20 0 points 0 points 0 points 0 points  Months in  reverse 4 points 0 points 0 points 0 points  Repeat phrase 10 points 2 points 2 points 6 points  Total Score 14 points 2 points 2 points 6 points    Immunizations Immunization History  Administered Date(s) Administered   Fluad Quad(high Dose 65+) 03/28/2019, 05/16/2020, 08/28/2021, 08/29/2022   Influenza Split 03/25/2010   Influenza, High Dose Seasonal PF 03/26/2015, 03/28/2016, 04/08/2017, 06/07/2018   Influenza, Seasonal, Injecte, Preservative Fre 04/02/2011, 06/16/2012   Influenza,inj,Quad PF,6+ Mos 02/24/2014   Influenza-Unspecified 02/24/2014   PFIZER(Purple Top)SARS-COV-2 Vaccination 08/04/2019, 08/25/2019, 05/29/2020, 12/11/2020   Pfizer Covid-19 Vaccine Bivalent Booster 3yrs & up 11/04/2021   Pneumococcal Conjugate-13 03/26/2015   Pneumococcal Polysaccharide-23 07/25/2010   Tdap 07/21/2006   Zoster Recombinant(Shingrix) 04/24/2020, 06/25/2020   Zoster, Live 06/24/2013    TDAP status: Up to date  Flu Vaccine status: Up to date  Pneumococcal vaccine status: Up to date  Covid-19 vaccine status: Completed vaccines  Qualifies for Shingles Vaccine? Yes   Zostavax completed Yes   Shingrix Completed?: No.    Education has been provided regarding the importance of this vaccine. Patient has been advised to call insurance company to determine out of pocket expense if they have not yet received this vaccine. Advised may also receive vaccine at local pharmacy or Health Dept. Verbalized acceptance and understanding.  Screening Tests Health Maintenance  Topic Date Due   OPHTHALMOLOGY EXAM  07/23/2018   Diabetic kidney evaluation - Urine ACR  08/21/2020   MAMMOGRAM  11/22/2020   COVID-19 Vaccine (6 - 2023-24 season) 03/14/2022   INFLUENZA VACCINE  02/12/2023   HEMOGLOBIN A1C  02/27/2023   FOOT EXAM  06/17/2023   Diabetic kidney evaluation - eGFR measurement  12/15/2023   Medicare Annual Wellness (AWV)  02/06/2024   Pneumonia Vaccine 49+ Years old  Completed   DEXA SCAN   Completed   Zoster Vaccines- Shingrix  Completed   HPV VACCINES  Aged Out   DTaP/Tdap/Td  Discontinued    Health Maintenance  Health Maintenance Due  Topic Date Due   OPHTHALMOLOGY EXAM  07/23/2018   Diabetic kidney evaluation - Urine ACR  08/21/2020   MAMMOGRAM  11/22/2020   COVID-19 Vaccine (6 - 2023-24 season) 03/14/2022    Colorectal cancer screening: No longer required.   Mammogram status: No longer required due to age.  Bone Density status: Completed yes. Results reflect: Bone density results: OSTEOPENIA. Repeat every 3 years.  Lung Cancer Screening: (Low Dose CT Chest recommended if Age 57-80 years, 20 pack-year currently smoking OR have quit w/in 15years.) does not qualify.   Lung Cancer Screening Referral: no   Additional Screening:  Hepatitis C Screening: does not qualify; Completed yes  Vision Screening: Recommended annual ophthalmology exams for early detection of glaucoma and other disorders of the eye. Is the patient up to date with their annual eye exam?  No  Who is the provider or what is the name of the office in which the  patient attends annual eye exams? [facility] If pt is not established with a provider, would they like to be referred to a provider to establish care? No . Pt says will schedule   Dental Screening: Recommended annual dental exams for proper oral hygiene  Diabetic Foot Exam: Diabetic Foot Exam: Completed yes  Community Resource Referral / Chronic Care Management: CRR required this visit?  No   CCM required this visit?  No     Plan:     I have personally reviewed and noted the following in the patient's chart:   Medical and social history Use of alcohol, tobacco or illicit drugs  Current medications and supplements including opioid prescriptions. Patient is not currently taking opioid prescriptions. Functional ability and status Nutritional status Physical activity Advanced directives List of other  physicians Hospitalizations, surgeries, and ER visits in previous 12 months Vitals Screenings to include cognitive, depression, and falls Referrals and appointments  In addition, I have reviewed and discussed with patient certain preventive protocols, quality metrics, and best practice recommendations. A written personalized care plan for preventive services as well as general preventive health recommendations were provided to patient.     Sue Lush, LPN   7/42/[id number]   After Visit Summary: (Declined) Due to this being a telephonic visit, with patients personalized plan was offered to patient but patient Declined AVS at this time   Nurse Notes: The patient states she is doing well and has no concerns or questions at this time.

## 2023-02-06 NOTE — Patient Instructions (Signed)
Melanie Gray , Thank you for taking time to come for your Medicare Wellness Visit. I appreciate your ongoing commitment to your health goals. Please review the following plan we discussed and let me know if I can assist you in the future.   Referrals/Orders/Follow-Ups/Clinician Recommendations: none  This is a list of the screening recommended for you and due dates:  Health Maintenance  Topic Date Due   Eye exam for diabetics  07/23/2018   Yearly kidney health urinalysis for diabetes  08/21/2020   Mammogram  11/22/2020   COVID-19 Vaccine (6 - 2023-24 season) 03/14/2022   Flu Shot  02/12/2023   Hemoglobin A1C  02/27/2023   Complete foot exam   06/17/2023   Yearly kidney function blood test for diabetes  12/15/2023   Medicare Annual Wellness Visit  02/06/2024   Pneumonia Vaccine  Completed   DEXA scan (bone density measurement)  Completed   Zoster (Shingles) Vaccine  Completed   HPV Vaccine  Aged Out   DTaP/Tdap/Td vaccine  Discontinued    Advanced directives: (Copy Requested) Please bring a copy of your health care power of attorney and living will to the office to be added to your chart at your convenience.  Next Medicare Annual Wellness Visit scheduled for next year: Yes 02/12/24  Preventive Care 65 Years and Older, Female Preventive care refers to lifestyle choices and visits with your health care provider that can promote health and wellness. What does preventive care include? A yearly physical exam. This is also called an annual well check. Dental exams once or twice a year. Routine eye exams. Ask your health care provider how often you should have your eyes checked. Personal lifestyle choices, including: Daily care of your teeth and gums. Regular physical activity. Eating a healthy diet. Avoiding tobacco and drug use. Limiting alcohol use. Practicing safe sex. Taking low-dose aspirin every day. Taking vitamin and mineral supplements as recommended by your health care  provider. What happens during an annual well check? The services and screenings done by your health care provider during your annual well check will depend on your age, overall health, lifestyle risk factors, and family history of disease. Counseling  Your health care provider may ask you questions about your: Alcohol use. Tobacco use. Drug use. Emotional well-being. Home and relationship well-being. Sexual activity. Eating habits. History of falls. Memory and ability to understand (cognition). Work and work Astronomer. Reproductive health. Screening  You may have the following tests or measurements: Height, weight, and BMI. Blood pressure. Lipid and cholesterol levels. These may be checked every 5 years, or more frequently if you are over 51 years old. Skin check. Lung cancer screening. You may have this screening every year starting at age 31 if you have a 30-pack-year history of smoking and currently smoke or have quit within the past 15 years. Fecal occult blood test (FOBT) of the stool. You may have this test every year starting at age 29. Flexible sigmoidoscopy or colonoscopy. You may have a sigmoidoscopy every 5 years or a colonoscopy every 10 years starting at age 58. Hepatitis C blood test. Hepatitis B blood test. Sexually transmitted disease (STD) testing. Diabetes screening. This is done by checking your blood sugar (glucose) after you have not eaten for a while (fasting). You may have this done every 1-3 years. Bone density scan. This is done to screen for osteoporosis. You may have this done starting at age 48. Mammogram. This may be done every 1-2 years. Talk to your health care  provider about how often you should have regular mammograms. Talk with your health care provider about your test results, treatment options, and if necessary, the need for more tests. Vaccines  Your health care provider may recommend certain vaccines, such as: Influenza vaccine. This is  recommended every year. Tetanus, diphtheria, and acellular pertussis (Tdap, Td) vaccine. You may need a Td booster every 10 years. Zoster vaccine. You may need this after age 81. Pneumococcal 13-valent conjugate (PCV13) vaccine. One dose is recommended after age 85. Pneumococcal polysaccharide (PPSV23) vaccine. One dose is recommended after age 45. Talk to your health care provider about which screenings and vaccines you need and how often you need them. This information is not intended to replace advice given to you by your health care provider. Make sure you discuss any questions you have with your health care provider. Document Released: 07/27/2015 Document Revised: 03/19/2016 Document Reviewed: 05/01/2015 Elsevier Interactive Patient Education  2017 [employer].  Fall Prevention in the Home Falls can cause injuries. They can happen to people of all ages. There are many things you can do to make your home safe and to help prevent falls. What can I do on the outside of my home? Regularly fix the edges of walkways and driveways and fix any cracks. Remove anything that might make you trip as you walk through a door, such as a raised step or threshold. Trim any bushes or trees on the path to your home. Use bright outdoor lighting. Clear any walking paths of anything that might make someone trip, such as rocks or tools. Regularly check to see if handrails are loose or broken. Make sure that both sides of any steps have handrails. Any raised decks and porches should have guardrails on the edges. Have any leaves, snow, or ice cleared regularly. Use sand or salt on walking paths during winter. Clean up any spills in your garage right away. This includes oil or grease spills. What can I do in the bathroom? Use night lights. Install grab bars by the toilet and in the tub and shower. Do not use towel bars as grab bars. Use non-skid mats or decals in the tub or shower. If you need to sit down in  the shower, use a plastic, non-slip stool. Keep the floor dry. Clean up any water that spills on the floor as soon as it happens. Remove soap buildup in the tub or shower regularly. Attach bath mats securely with double-sided non-slip rug tape. Do not have throw rugs and other things on the floor that can make you trip. What can I do in the bedroom? Use night lights. Make sure that you have a light by your bed that is easy to reach. Do not use any sheets or blankets that are too big for your bed. They should not hang down onto the floor. Have a firm chair that has side arms. You can use this for support while you get dressed. Do not have throw rugs and other things on the floor that can make you trip. What can I do in the kitchen? Clean up any spills right away. Avoid walking on wet floors. Keep items that you use a lot in easy-to-reach places. If you need to reach something above you, use a strong step stool that has a grab bar. Keep electrical cords out of the way. Do not use floor polish or wax that makes floors slippery. If you must use wax, use non-skid floor wax. Do not have throw rugs  and other things on the floor that can make you trip. What can I do with my stairs? Do not leave any items on the stairs. Make sure that there are handrails on both sides of the stairs and use them. Fix handrails that are broken or loose. Make sure that handrails are as long as the stairways. Check any carpeting to make sure that it is firmly attached to the stairs. Fix any carpet that is loose or worn. Avoid having throw rugs at the top or bottom of the stairs. If you do have throw rugs, attach them to the floor with carpet tape. Make sure that you have a light switch at the top of the stairs and the bottom of the stairs. If you do not have them, ask someone to add them for you. What else can I do to help prevent falls? Wear shoes that: Do not have high heels. Have rubber bottoms. Are comfortable  and fit you well. Are closed at the toe. Do not wear sandals. If you use a stepladder: Make sure that it is fully opened. Do not climb a closed stepladder. Make sure that both sides of the stepladder are locked into place. Ask someone to hold it for you, if possible. Clearly mark and make sure that you can see: Any grab bars or handrails. First and last steps. Where the edge of each step is. Use tools that help you move around (mobility aids) if they are needed. These include: Canes. Walkers. Scooters. Crutches. Turn on the lights when you go into a dark area. Replace any light bulbs as soon as they burn out. Set up your furniture so you have a clear path. Avoid moving your furniture around. If any of your floors are uneven, fix them. If there are any pets around you, be aware of where they are. Review your medicines with your doctor. Some medicines can make you feel dizzy. This can increase your chance of falling. Ask your doctor what other things that you can do to help prevent falls. This information is not intended to replace advice given to you by your health care provider. Make sure you discuss any questions you have with your health care provider. Document Released: 04/26/2009 Document Revised: 12/06/2015 Document Reviewed: 08/04/2014 Elsevier Interactive Patient Education  2017 [employer].

## 2023-02-19 ENCOUNTER — Ambulatory Visit: Payer: Medicare Other | Admitting: Podiatry

## 2023-02-24 ENCOUNTER — Ambulatory Visit: Payer: Medicare Other | Admitting: Podiatry

## 2023-02-24 DIAGNOSIS — B351 Tinea unguium: Secondary | ICD-10-CM | POA: Diagnosis not present

## 2023-02-24 DIAGNOSIS — M79675 Pain in left toe(s): Secondary | ICD-10-CM | POA: Diagnosis not present

## 2023-02-24 DIAGNOSIS — E1122 Type 2 diabetes mellitus with diabetic chronic kidney disease: Secondary | ICD-10-CM | POA: Diagnosis not present

## 2023-02-24 DIAGNOSIS — M79674 Pain in right toe(s): Secondary | ICD-10-CM

## 2023-02-24 DIAGNOSIS — N183 Chronic kidney disease, stage 3 unspecified: Secondary | ICD-10-CM

## 2023-02-24 NOTE — Progress Notes (Signed)
This patient returns to my office for at risk foot care.  This patient requires this care by a professional since this patient will be at risk due to having chronic kidney disease and chronic venous insufficiency.  This patient is unable to cut nails herself since the patient cannot reach her nails.These nails are painful walking and wearing shoes.  This patient presents for at risk foot care today.  General Appearance  Alert, conversant and in no acute stress.  Vascular  Dorsalis pedis and posterior tibial  pulses are weakly  palpable  bilaterally.  Capillary return is within normal limits  bilaterally. Temperature is within normal limits  bilaterally.  Neurologic  Senn-Weinstein monofilament wire test within normal limits  bilaterally. Muscle power within normal limits bilaterally.  Nails Thick disfigured discolored nails with subungual debris  from hallux to fifth toes bilaterally. No evidence of bacterial infection or drainage bilaterally.  Orthopedic  No limitations of motion  feet .  No crepitus or effusions noted.  No bony pathology or digital deformities noted.  Skin  normotropic skin with no porokeratosis noted bilaterally.  No signs of infections or ulcers noted.   Clavi 3rd toe left foot.  Onychomycosis  Pain in right toes  Pain in left toes    Consent was obtained for treatment procedures.   Mechanical debridement of nails 1-5  bilaterally performed with a nail nipper.  Filed with dremel without incident.  Debride clavi with dremel tool.   Return office visit  3 months                   Told patient to return for periodic foot care and evaluation due to potential at risk complications.   Nicholes Rough DPM

## 2023-02-25 DIAGNOSIS — I10 Essential (primary) hypertension: Secondary | ICD-10-CM | POA: Diagnosis not present

## 2023-02-25 DIAGNOSIS — R634 Abnormal weight loss: Secondary | ICD-10-CM | POA: Diagnosis not present

## 2023-02-25 DIAGNOSIS — N182 Chronic kidney disease, stage 2 (mild): Secondary | ICD-10-CM | POA: Diagnosis not present

## 2023-02-26 NOTE — Progress Notes (Signed)
Name: Melanie Gray   MRN: [id number]    DOB: 02-11-1941   Date:02/27/2023       Progress Note  Subjective  Chief Complaint  Follow Up  HPI  HTN: since hospital stay only taking norvasc 2.5 mg and bp is at goal, she lost weight. No chest pain or palpitation   Diabetes with CKI stage III on ARB, she sees a nephrologist last GFR stable, she is due for urine micro.  She denies polyphagia, polydipsia or polyuria but has urinary frequency and nocturia. She follows a diabetic diet. HgbA1C has been within normal limits.   Malnutrition: she has lost 8 lbs since admission to [facility] for SBO and partial small bowel resection -120 cms. She states appetite is still poor, eating small portions to try to gain more weight. She is adding protein shakes with meals.   Hyperlipidemia: She is currently taking Atorvastatin and denies any side effects. Last LDL was 75    Major Depression: she had depression on an off throughout her life, really struggled after her husband died and she lost weight, had lack of motivation for years, but she has been doing well now, she states she is an introvert, she is socializing over the phone. Her son has moved in with her early 2022 because she was afraid after her fall in 2021. He takes her to her appointments.    Varicose veins:  she wears compression stocking hoses now and seems to be helping with lower extremity edema. Unchanged    Urge incontinence: she switched to decaf , she continues to have urinary frequency and urgency, going on for years, wearing depends, it affects her ability to go out with family but does not want to take any more medication   OA knee: history of arthroscopic surgery of right knee , she still uses a cane and walks slowly. Right knee has mild effusion intermittently , she was able to get PT after hospital stay and it helped her stay active   Patient Active Problem List   Diagnosis Date Noted   Hypokalemia 12/12/2022   Acute blood loss anemia  12/11/2022   Small bowel ischemia (HCC) 12/11/2022   Malnutrition of moderate degree 12/10/2022   SBO (small bowel obstruction) (HCC) 12/08/2022   S/P laparotomy 12/08/2022   Sinus bradycardia 12/08/2022   Pain due to onychomycosis of toenails of both feet 08/30/2020   Injury of toenail of right foot 08/30/2020   Edema of lower extremity 12/05/2019   Depression, major, recurrent, in remission (HCC) 08/10/2017   Varicose veins of both lower extremities with inflammation 12/01/2016   Chronic venous insufficiency 12/01/2016   Benign hypertension 11/06/2014   Chronic anemia 11/06/2014   Seborrheic dermatitis 11/06/2014   Type 2 diabetes mellitus with renal complication (HCC) 11/06/2014   Dyslipidemia 11/06/2014   Cephalalgia 11/06/2014   Deafness, sensorineural 11/06/2014   Arthritis of knee, degenerative 11/06/2014   Panic attack 11/06/2014   Restless legs syndrome 11/06/2014   Allergic rhinitis 11/06/2014   Spinal stenosis 11/06/2014   Osteopenia 05/09/2009   Chronic kidney disease, stage 3a (HCC) 04/20/2008    Past Surgical History:  Procedure Laterality Date   ABDOMINAL HYSTERECTOMY     BOWEL RESECTION N/A 12/07/2022   Procedure: SMALL BOWEL RESECTION;  Surgeon: Leafy Ro, MD;  Location: [facility] ORS;  Service: General;  Laterality: N/A;   CATARACT EXTRACTION W/PHACO Left 07/16/2017   Procedure: CATARACT EXTRACTION PHACO AND INTRAOCULAR LENS PLACEMENT (IOC);  Surgeon: [name], MD;  Location:  [facility] ORS;  Service: Ophthalmology;  Laterality: Left;  [place] 01:27.0 AP% 21.0 CDE 18.28 Fluid Pack Lot # [id number]   CHOLECYSTECTOMY     HERNIA REPAIR     LAPAROTOMY N/A 12/07/2022   Procedure: EXPLORATORY LAPAROTOMY;  Surgeon: Leafy Ro, MD;  Location: [facility] ORS;  Service: General;  Laterality: N/A;    Family History  Problem Relation Age of Onset   Cancer Mother    Diabetes Father    Cancer Sister    Breast cancer Neg Hx     Social History   Tobacco Use   Smoking  status: Never    Passive exposure: Never   Smokeless tobacco: Never  Substance Use Topics   Alcohol use: No    Alcohol/week: 0.0 standard drinks of alcohol     Current Outpatient Medications:    acetaminophen (TYLENOL) 500 MG tablet, Take 1,000 mg by mouth daily as needed for moderate pain or headache., Disp: , Rfl:    amLODipine (NORVASC) 2.5 MG tablet, Take 1 tablet (2.5 mg total) by mouth daily., Disp: 90 tablet, Rfl: 1   aspirin EC 81 MG tablet, Take 81 mg by mouth daily., Disp: , Rfl:    atorvastatin (LIPITOR) 40 MG tablet, Take 1 tablet (40 mg total) by mouth daily., Disp: 90 tablet, Rfl: 1   cholecalciferol (VITAMIN D3) 25 MCG (1000 UNIT) tablet, Take 1 tablet (1,000 Units total) by mouth daily., Disp: , Rfl:    diclofenac Sodium (VOLTAREN) 1 % GEL, Apply 4 g topically 4 (four) times daily. (Patient taking differently: Apply 4 g topically 4 (four) times daily as needed (pain).), Disp: 300 g, Rfl: 1   feeding supplement (ENSURE ENLIVE / ENSURE PLUS) LIQD, Take 237 mLs by mouth 3 (three) times daily between meals., Disp: 237 mL, Rfl: 12   Glucosamine-Chondroitin (OSTEO BI-FLEX REGULAR STRENGTH PO), Take 1 tablet by mouth daily., Disp: , Rfl:    Multiple Vitamin (MULTI-VITAMINS) TABS, Take 1 tablet by mouth every evening. , Disp: , Rfl:    Polyethyl Glycol-Propyl Glycol (SYSTANE OP), Apply 1 drop to eye daily as needed (dry eyes)., Disp: , Rfl:   Allergies  Allergen Reactions   Sulfa Antibiotics Rash    I personally reviewed active problem list, medication list, allergies, family history, social history, health maintenance with the patient/caregiver today.   ROS  Ten systems reviewed and is negative except as mentioned in HPI    Objective  Vitals:   02/27/23 1118  BP: 126/72  Pulse: 73  Resp: 16  SpO2: 96%  Weight: 130 lb (59 kg)  Height: 5\' 6"  (1.676 m)    Body mass index is 20.98 kg/m.  Physical Exam  Constitutional: Patient appears well-developed and  malnourished  No distress.  HEENT: head atraumatic, normocephalic, pupils equal and reactive to light, neck supple, Cardiovascular: Normal rate, regular rhythm and normal heart sounds.  No murmur heard. No BLE edema. Pulmonary/Chest: Effort normal and breath sounds normal. No respiratory distress. Abdominal: Soft.  There is no tenderness. Psychiatric: Patient has a normal mood and affect. behavior is normal. Judgment and thought content normal.    PHQ2/9:    02/27/2023   11:18 AM 02/06/2023   11:27 AM 12/23/2022    2:26 PM 08/29/2022   10:44 AM 02/26/2022   11:27 AM  Depression screen PHQ 2/9  Decreased Interest 0 0 1 0 0  Down, Depressed, Hopeless 0 0 1 0 0  PHQ - 2 Score 0 0 2 0 0  Altered sleeping  0  0 1 1  Tired, decreased energy 0  1 0 0  Change in appetite 3  0 0 0  Feeling bad or failure about yourself  0  0 1 0  Trouble concentrating 0  0 0 0  Moving slowly or fidgety/restless 0  0 0 0  Suicidal thoughts 0  0 0 0  PHQ-9 Score 3  3 2 1   Difficult doing work/chores  Not difficult at all Not difficult at all      phq 9 is negative   Fall Risk:    02/27/2023   11:18 AM 02/06/2023   11:23 AM 08/29/2022   10:41 AM 02/26/2022   11:27 AM 12/24/2021    1:21 PM  Fall Risk   Falls in the past year? 0 0 0 0 0  Number falls in past yr: 0 0 0 0 0  Injury with Fall? 0 0 0 0 0  Risk for fall due to : Impaired balance/gait No Fall Risks Impaired balance/gait Impaired balance/gait No Fall Risks  Follow up Falls prevention discussed Education provided;Falls prevention discussed Falls prevention discussed Falls prevention discussed Falls prevention discussed      Functional Status Survey: Is the patient deaf or have difficulty hearing?: No Does the patient have difficulty seeing, even when wearing glasses/contacts?: No Does the patient have difficulty concentrating, remembering, or making decisions?: No Does the patient have difficulty walking or climbing stairs?: Yes Does the  patient have difficulty dressing or bathing?: No Does the patient have difficulty doing errands alone such as visiting a doctor's office or shopping?: Yes    Assessment & Plan  1. Type 2 diabetes mellitus with stage 3a chronic kidney disease, without long-term current use of insulin (HCC)  - POCT HgB A1C - Urine Microalbumin w/creat. ratio - amLODipine (NORVASC) 2.5 MG tablet; Take 1 tablet (2.5 mg total) by mouth daily.  Dispense: 90 tablet; Refill: 1  2. Benign hypertension with chronic kidney disease, stage III (HCC)  BP is at goal, off ARB since hospital stay   3. Depression, major, recurrent, in remission (HCC)  Doing well   4. Malnutrition of moderate degree (HCC)  Discussed importance of weight gain, continue protein supplementation   5. Need for immunization against influenza  - Flu Vaccine QUAD High Dose(Fluad)  6. Primary osteoarthritis of both knees   7. History of small bowel obstruction   8. Dyslipidemia  - atorvastatin (LIPITOR) 40 MG tablet; Take 1 tablet (40 mg total) by mouth daily.  Dispense: 90 tablet; Refill: 1  9. Benign hypertension  - atorvastatin (LIPITOR) 40 MG tablet; Take 1 tablet (40 mg total) by mouth daily.  Dispense: 90 tablet; Refill: 1

## 2023-02-27 ENCOUNTER — Ambulatory Visit (INDEPENDENT_AMBULATORY_CARE_PROVIDER_SITE_OTHER): Payer: Medicare Other | Admitting: Family Medicine

## 2023-02-27 ENCOUNTER — Encounter: Payer: Self-pay | Admitting: Family Medicine

## 2023-02-27 VITALS — BP 126/72 | HR 73 | Resp 16 | Ht 66.0 in | Wt 130.0 lb

## 2023-02-27 DIAGNOSIS — Z23 Encounter for immunization: Secondary | ICD-10-CM | POA: Diagnosis not present

## 2023-02-27 DIAGNOSIS — N1831 Chronic kidney disease, stage 3a: Secondary | ICD-10-CM | POA: Diagnosis not present

## 2023-02-27 DIAGNOSIS — E785 Hyperlipidemia, unspecified: Secondary | ICD-10-CM | POA: Diagnosis not present

## 2023-02-27 DIAGNOSIS — I129 Hypertensive chronic kidney disease with stage 1 through stage 4 chronic kidney disease, or unspecified chronic kidney disease: Secondary | ICD-10-CM

## 2023-02-27 DIAGNOSIS — I10 Essential (primary) hypertension: Secondary | ICD-10-CM | POA: Diagnosis not present

## 2023-02-27 DIAGNOSIS — E440 Moderate protein-calorie malnutrition: Secondary | ICD-10-CM

## 2023-02-27 DIAGNOSIS — M170 Bilateral primary osteoarthritis of knee: Secondary | ICD-10-CM

## 2023-02-27 DIAGNOSIS — N183 Chronic kidney disease, stage 3 unspecified: Secondary | ICD-10-CM | POA: Diagnosis not present

## 2023-02-27 DIAGNOSIS — Z1231 Encounter for screening mammogram for malignant neoplasm of breast: Secondary | ICD-10-CM

## 2023-02-27 DIAGNOSIS — E1122 Type 2 diabetes mellitus with diabetic chronic kidney disease: Secondary | ICD-10-CM

## 2023-02-27 DIAGNOSIS — Z8719 Personal history of other diseases of the digestive system: Secondary | ICD-10-CM | POA: Diagnosis not present

## 2023-02-27 DIAGNOSIS — F3340 Major depressive disorder, recurrent, in remission, unspecified: Secondary | ICD-10-CM | POA: Diagnosis not present

## 2023-02-27 LAB — POCT GLYCOSYLATED HEMOGLOBIN (HGB A1C): Hemoglobin A1C: 5.6 % (ref 4.0–5.6)

## 2023-02-27 MED ORDER — AMLODIPINE BESYLATE 2.5 MG PO TABS: 2.5000 mg | ORAL_TABLET | Freq: Every day | ORAL | 1 refills | Status: DC

## 2023-02-27 MED ORDER — ATORVASTATIN CALCIUM 40 MG PO TABS
40.0000 mg | ORAL_TABLET | Freq: Every day | ORAL | 1 refills | Status: DC
Start: 2023-02-27 — End: 2023-07-01

## 2023-06-01 ENCOUNTER — Ambulatory Visit (INDEPENDENT_AMBULATORY_CARE_PROVIDER_SITE_OTHER): Payer: Medicare Other | Admitting: Podiatry

## 2023-06-01 DIAGNOSIS — M79674 Pain in right toe(s): Secondary | ICD-10-CM | POA: Diagnosis not present

## 2023-06-01 DIAGNOSIS — N183 Chronic kidney disease, stage 3 unspecified: Secondary | ICD-10-CM | POA: Diagnosis not present

## 2023-06-01 DIAGNOSIS — M79675 Pain in left toe(s): Secondary | ICD-10-CM

## 2023-06-01 DIAGNOSIS — L84 Corns and callosities: Secondary | ICD-10-CM | POA: Diagnosis not present

## 2023-06-01 DIAGNOSIS — B351 Tinea unguium: Secondary | ICD-10-CM | POA: Diagnosis not present

## 2023-06-01 DIAGNOSIS — E1122 Type 2 diabetes mellitus with diabetic chronic kidney disease: Secondary | ICD-10-CM | POA: Diagnosis not present

## 2023-06-05 ENCOUNTER — Encounter: Payer: Self-pay | Admitting: Podiatry

## 2023-06-05 NOTE — Progress Notes (Signed)
  Subjective:  Patient ID: Melanie Gray, female    DOB: Jul 28, 1940,  MRN: [id number]  Melanie Gray presents to clinic today for preventative diabetic foot care and corn(s) of both feet and painful thick toenails that are difficult to trim. Painful toenails interfere with ambulation. Aggravating factors include wearing enclosed shoe gear. Pain is relieved with periodic professional debridement. Painful corns are aggravated when weightbearing when wearing enclosed shoe gear. Pain is relieved with periodic professional debridement.  New problem(s): None.   PCP is Alba Cory, MD. Last office visit 02/27/2023.  Allergies  Allergen Reactions   Sulfa Antibiotics Rash   Review of Systems: Negative except as noted in the HPI.  Objective: No changes noted in today's physical examination. There were no vitals filed for this visit. Melanie Gray is a pleasant 82 y.o. female WD, WN in NAD. AAO x 3.  Vascular Examination: CFT <3 seconds b/l. DP/PT pulses faintly palpable b/l. Skin temperature gradient warm to warm b/l. No pain with calf compression. No ischemia or gangrene. No cyanosis or clubbing noted b/l. Varicosities present b/l.   Neurological Examination: Sensation grossly intact b/l with 10 gram monofilament. Vibratory sensation intact b/l.   Dermatological Examination: Pedal skin warm and supple b/l.   No open wounds. No interdigital macerations.  Toenails 1-5 b/l thick, discolored, elongated with subungual debris and pain on dorsal palpation.    Hyperkeratotic lesion(s) distal tip of left 3rd toe and R 5th toe.  No erythema, no edema, no drainage, no fluctuance.  Musculoskeletal Examination: Muscle strength 5/5 to all lower extremity muscle groups bilaterally. Hammertoe deformity noted 2-5 b/l.  Radiographs: None  Last A1c:      Latest Ref Rng & Units 02/27/2023   11:20 AM 08/29/2022   10:51 AM  Hemoglobin A1C  Hemoglobin-A1c 4.0 - 5.6 % 5.6  5.8    Assessment/Plan: 1. Pain due to  onychomycosis of toenails of both feet   2. Corns   3. Type 2 diabetes mellitus with stage 3 chronic kidney disease, without long-term current use of insulin, unspecified whether stage 3a or 3b CKD (HCC)     -Patient was evaluated today. All questions/concerns addressed on today's visit. -Continue foot and shoe inspections daily. Monitor blood glucose per PCP/Endocrinologist's recommendations. -Patient to continue soft, supportive shoe gear daily. -Toenails 1-5 b/l were debrided in length and girth with sterile nail nippers and dremel without iatrogenic bleeding.  -Corn(s) L 3rd toe and R 5th toe pared utilizing sterile scalpel blade without complication or incident. Total number debrided=2. -Dispensed toe crest. Apply to L 3rd toe every morning. Remove every evening. -Patient/POA to call should there be question/concern in the interim.   Return in about 3 months (around 09/01/2023).  Freddie Breech, DPM      [facility] LOCATION: [address] [place] [address]                   Office [phone]   [facility] LOCATION: [address] [place], [place] [address] Office [phone]

## 2023-06-19 NOTE — Progress Notes (Signed)
Name: Melanie Gray   MRN: [id number]    DOB: 1940-08-12   Date:07/01/2023       Progress Note  Subjective  Chief Complaint  Chief Complaint  Patient presents with   Medical Management of Chronic Issues    HPI  Discussed the use of AI scribe software for clinical note transcription with the patient, who gave verbal consent to proceed.  History of Present Illness   The patient, with a history of type 2 diabetes, renal complications, and malnutrition, presented for a regular follow-up visit. She reported frequent urination, which she attributed to her high fluid intake, including three daily servings of Ensure for malnutrition management. She denied excessive hunger or thirst.  The patient also reported a recent issue with a painful corn or similar growth under the third toe of her left foot, which significantly impaired her mobility. After seeing a podiatrist, she received a plastic insert for her shoe, which has alleviated the pain and improved her mobility.  The patient has been making dietary changes, attempting to eat three meals a day, which she reported as a new habit since her hospital discharge june 2024 . She has gained four pounds, which is a positive sign given her previous diagnosis of malnutrition.  The patient also expressed concerns about her memory, noting instances of forgetfulness in her daily life. She expressed interest in further evaluation of her memory issues. She also reported feeling cold, particularly in her upper body, after consuming cold drinks.  The patient has a history of depression, which worsened significantly after the death of her spouse. However, she reported no current feelings of depression. She lives with her son due to mobility issues and concerns about falling. She also reported arthritis, which requires her to use a cane for mobility.         Patient Active Problem List   Diagnosis Date Noted   S/P laparotomy 12/08/2022   Sinus bradycardia  12/08/2022   Pain due to onychomycosis of toenails of both feet 08/30/2020   Injury of toenail of right foot 08/30/2020   Depression, major, recurrent, in remission (HCC) 08/10/2017   Varicose veins of both lower extremities with inflammation 12/01/2016   Chronic venous insufficiency 12/01/2016   Benign hypertension 11/06/2014   Chronic anemia 11/06/2014   Seborrheic dermatitis 11/06/2014   Type 2 diabetes mellitus with renal complication (HCC) 11/06/2014   Dyslipidemia 11/06/2014   Cephalalgia 11/06/2014   Deafness, sensorineural 11/06/2014   Arthritis of knee, degenerative 11/06/2014   Panic attack 11/06/2014   Restless legs syndrome 11/06/2014   Allergic rhinitis 11/06/2014   Spinal stenosis 11/06/2014   Osteopenia 05/09/2009   Chronic kidney disease, stage 3a (HCC) 04/20/2008    Past Surgical History:  Procedure Laterality Date   ABDOMINAL HYSTERECTOMY     BOWEL RESECTION N/A 12/07/2022   Procedure: SMALL BOWEL RESECTION;  Surgeon: Leafy Ro, MD;  Location: [facility] OR[facility];  Service: General;  Laterality: N/A;   CATARACT EXTRACTION W/PHACO Left 07/16/2017   Procedure: CATARACT EXTRACTION PHACO AND INTRAOCULAR LENS PLACEMENT (IOC);  Surgeon: [name], MD;  Location: [facility];  Service: Ophthalmology;  Laterality: Left;  [place] 01:27.0 AP% 21.0 CDE 18.28 Fluid Pack Lot # [id number]   CHOLECYSTECTOMY     HERNIA REPAIR     LAPAROTOMY N/A 12/07/2022   Procedure: EXPLORATORY LAPAROTOMY;  Surgeon: Leafy Ro, MD;  Location: [facility];  Service: General;  Laterality: N/A;    Family History  Problem Relation Age of Onset  Cancer Mother    Diabetes Father    Cancer Sister    Breast cancer Neg Hx     Social History   Tobacco Use   Smoking status: Never    Passive exposure: Never   Smokeless tobacco: Never  Substance Use Topics   Alcohol use: No    Alcohol/week: 0.0 standard drinks of alcohol     Current Outpatient Medications:    acetaminophen (TYLENOL) 500 MG  tablet, Take 1,000 mg by mouth daily as needed for moderate pain or headache., Disp: , Rfl:    aspirin EC 81 MG tablet, Take 81 mg by mouth daily., Disp: , Rfl:    cholecalciferol (VITAMIN D3) 25 MCG (1000 UNIT) tablet, Take 1 tablet (1,000 Units total) by mouth daily., Disp: , Rfl:    diclofenac Sodium (VOLTAREN) 1 % GEL, Apply 4 g topically 4 (four) times daily. (Patient taking differently: Apply 4 g topically 4 (four) times daily as needed (pain).), Disp: 300 g, Rfl: 1   feeding supplement (ENSURE ENLIVE / ENSURE PLUS) LIQD, Take 237 mLs by mouth 3 (three) times daily between meals., Disp: 237 mL, Rfl: 12   Glucosamine-Chondroitin (OSTEO BI-FLEX REGULAR STRENGTH PO), Take 1 tablet by mouth daily., Disp: , Rfl:    Multiple Vitamin (MULTI-VITAMINS) TABS, Take 1 tablet by mouth every evening. , Disp: , Rfl:    Polyethyl Glycol-Propyl Glycol (SYSTANE OP), Apply 1 drop to eye daily as needed (dry eyes)., Disp: , Rfl:    amLODipine (NORVASC) 2.5 MG tablet, Take 1 tablet (2.5 mg total) by mouth daily., Disp: 90 tablet, Rfl: 1   atorvastatin (LIPITOR) 40 MG tablet, Take 1 tablet (40 mg total) by mouth daily., Disp: 90 tablet, Rfl: 1  Allergies  Allergen Reactions   Sulfa Antibiotics Rash    I personally reviewed active problem list, medication list, allergies, family history with the patient/caregiver today.   ROS  Ten systems reviewed and is negative except as mentioned in HPI    Objective  Vitals:   07/01/23 1133  BP: 136/80  Pulse: 78  Resp: 18  Temp: 97.7 F (36.5 C)  SpO2: (!) 87%  Weight: 134 lb 4.8 oz (60.9 kg)  Height: 5\' 6"  (1.676 m)    Body mass index is 21.68 kg/m.  Physical Exam  Constitutional: Patient appears well-developed and malnourished with temporal waisting  No distress.  HEENT: head atraumatic, normocephalic, pupils equal and reactive to light, neck supple Cardiovascular: Normal rate, regular rhythm and normal heart sounds.  No murmur heard. No BLE  edema. Pulmonary/Chest: Effort normal and breath sounds normal. No respiratory distress. Abdominal: Soft.  There is no tenderness. Psychiatric: Patient has a normal mood and affect. behavior is normal. Judgment and thought content normal.   Recent Results (from the past 2160 hours)  POCT HgB A1C     Status: Abnormal   Collection Time: 07/01/23 11:49 AM  Result Value Ref Range   Hemoglobin A1C 5.8 (A) 4.0 - 5.6 %   HbA1c POC (<> result, manual entry)     HbA1c, POC (prediabetic range)     HbA1c, POC (controlled diabetic range)      Diabetic Foot Exam: Diabetic Foot Exam - Simple   Simple Foot Form Visual Inspection See comments: Yes Sensation Testing Intact to touch and monofilament testing bilaterally: Yes Pulse Check Posterior Tibialis and Dorsalis pulse intact bilaterally: Yes Comments Corn formation, hammer toe, dry skin      PHQ2/9:    07/01/2023   11:35 AM  02/27/2023   11:18 AM 02/06/2023   11:27 AM 12/23/2022    2:26 PM 08/29/2022   10:44 AM  Depression screen PHQ 2/9  Decreased Interest 0 0 0 1 0  Down, Depressed, Hopeless 0 0 0 1 0  PHQ - 2 Score 0 0 0 2 0  Altered sleeping 0 0  0 1  Tired, decreased energy 0 0  1 0  Change in appetite 1 3  0 0  Feeling bad or failure about yourself  0 0  0 1  Trouble concentrating 0 0  0 0  Moving slowly or fidgety/restless 1 0  0 0  Suicidal thoughts 0 0  0 0  PHQ-9 Score 2 3  3 2   Difficult doing work/chores Not difficult at all  Not difficult at all Not difficult at all     phq 9 is negative   Fall Risk:    07/01/2023   11:35 AM 02/27/2023   11:18 AM 02/06/2023   11:23 AM 08/29/2022   10:41 AM 02/26/2022   11:27 AM  Fall Risk   Falls in the past year? 0 0 0 0 0  Number falls in past yr: 0 0 0 0 0  Injury with Fall? 0 0 0 0 0  Risk for fall due to :  Impaired balance/gait No Fall Risks Impaired balance/gait Impaired balance/gait  Follow up  Falls prevention discussed Education provided;Falls prevention discussed  Falls prevention discussed Falls prevention discussed    Assessment & Plan   Assessment and Plan    Type 2 Diabetes Mellitus Well controlled with diet alone, A1c 5.6 four months ago. Frequent urination due to high fluid intake, not due to hyperglycemia. Peripheral sensation intact on examination, but has callus formation on foot exam -Continue diet control. -Schedule eye exam for diabetic retinopathy screening.  Chronic Kidney Disease stage IIIa Followed by nephrology, next appointment in February. -Continue current management.  Dyslipidemia On Atorvastatin. -Continue Atorvastatin.  Malnutrition - lost weigh rapidly during hospital stay this Summer , weight is still below her baseline of around 140 lbs but trending up now Weight gain of 4 pounds, consuming three Ensure drinks daily. -Continue current dietary plan and Ensure supplementation.  Foot Corn Painful corn on third toe of left foot, managed by podiatrist with a plastic device. -Continue current management by podiatrist.  Memory Loss Patient reports forgetfulness, no formal cognitive assessment performed during this visit. -Schedule cognitive screening at next visit in four months.  Depression  History of major depressive disorder, currently in remission. -Continue current management.  General Health Maintenance -Schedule mammogram for breast cancer screening. -Attempt to obtain RSV and Tetanus vaccines, pending insurance coverage.

## 2023-07-01 ENCOUNTER — Encounter: Payer: Self-pay | Admitting: Family Medicine

## 2023-07-01 ENCOUNTER — Ambulatory Visit: Payer: Medicare Other | Admitting: Family Medicine

## 2023-07-01 VITALS — BP 136/80 | HR 78 | Temp 97.7°F | Resp 18 | Ht 66.0 in | Wt 134.3 lb

## 2023-07-01 DIAGNOSIS — N1831 Chronic kidney disease, stage 3a: Secondary | ICD-10-CM

## 2023-07-01 DIAGNOSIS — E785 Hyperlipidemia, unspecified: Secondary | ICD-10-CM | POA: Diagnosis not present

## 2023-07-01 DIAGNOSIS — I10 Essential (primary) hypertension: Secondary | ICD-10-CM

## 2023-07-01 DIAGNOSIS — M170 Bilateral primary osteoarthritis of knee: Secondary | ICD-10-CM | POA: Diagnosis not present

## 2023-07-01 DIAGNOSIS — N183 Chronic kidney disease, stage 3 unspecified: Secondary | ICD-10-CM

## 2023-07-01 DIAGNOSIS — F3340 Major depressive disorder, recurrent, in remission, unspecified: Secondary | ICD-10-CM | POA: Diagnosis not present

## 2023-07-01 DIAGNOSIS — E1122 Type 2 diabetes mellitus with diabetic chronic kidney disease: Secondary | ICD-10-CM

## 2023-07-01 DIAGNOSIS — Z1231 Encounter for screening mammogram for malignant neoplasm of breast: Secondary | ICD-10-CM | POA: Diagnosis not present

## 2023-07-01 DIAGNOSIS — I129 Hypertensive chronic kidney disease with stage 1 through stage 4 chronic kidney disease, or unspecified chronic kidney disease: Secondary | ICD-10-CM

## 2023-07-01 DIAGNOSIS — Z8719 Personal history of other diseases of the digestive system: Secondary | ICD-10-CM

## 2023-07-01 LAB — POCT GLYCOSYLATED HEMOGLOBIN (HGB A1C): Hemoglobin A1C: 5.8 % — AB (ref 4.0–5.6)

## 2023-07-01 MED ORDER — AMLODIPINE BESYLATE 2.5 MG PO TABS: 2.5000 mg | ORAL_TABLET | Freq: Every day | ORAL | 1 refills | Status: DC

## 2023-07-01 MED ORDER — ATORVASTATIN CALCIUM 40 MG PO TABS
40.0000 mg | ORAL_TABLET | Freq: Every day | ORAL | 1 refills | Status: DC
Start: 2023-07-01 — End: 2023-11-20

## 2023-07-06 DIAGNOSIS — E1122 Type 2 diabetes mellitus with diabetic chronic kidney disease: Secondary | ICD-10-CM | POA: Diagnosis not present

## 2023-07-06 DIAGNOSIS — N1831 Chronic kidney disease, stage 3a: Secondary | ICD-10-CM | POA: Diagnosis not present

## 2023-07-07 LAB — MICROALBUMIN / CREATININE URINE RATIO
Creatinine, Urine: 158 mg/dL (ref 20–275)
Microalb Creat Ratio: 25 mg/g{creat} (ref <30)
Microalb, Ur: 4 mg/dL

## 2023-08-12 DIAGNOSIS — H35371 Puckering of macula, right eye: Secondary | ICD-10-CM | POA: Diagnosis not present

## 2023-08-12 LAB — HM DIABETES EYE EXAM

## 2023-08-17 ENCOUNTER — Encounter: Payer: Self-pay | Admitting: Podiatry

## 2023-08-17 ENCOUNTER — Ambulatory Visit (INDEPENDENT_AMBULATORY_CARE_PROVIDER_SITE_OTHER): Payer: Medicare Other | Admitting: Podiatry

## 2023-08-17 DIAGNOSIS — E1122 Type 2 diabetes mellitus with diabetic chronic kidney disease: Secondary | ICD-10-CM

## 2023-08-17 DIAGNOSIS — M79674 Pain in right toe(s): Secondary | ICD-10-CM | POA: Diagnosis not present

## 2023-08-17 DIAGNOSIS — N183 Chronic kidney disease, stage 3 unspecified: Secondary | ICD-10-CM

## 2023-08-17 DIAGNOSIS — L84 Corns and callosities: Secondary | ICD-10-CM

## 2023-08-17 DIAGNOSIS — M79675 Pain in left toe(s): Secondary | ICD-10-CM | POA: Diagnosis not present

## 2023-08-17 DIAGNOSIS — B351 Tinea unguium: Secondary | ICD-10-CM | POA: Diagnosis not present

## 2023-08-17 NOTE — Patient Instructions (Signed)
   For dry or cracked skin, recommend daily use of Bag Balm Hand and Body Moistuizer which may be purchased at local drug store or on [employer].

## 2023-08-18 NOTE — Progress Notes (Signed)
  Subjective:  Patient ID: Melanie Gray, female    DOB: 1940-08-12,  MRN: [id number]  Tymika Grilli presents to clinic today for at risk foot care. Pt has h/o NIDDM with chronic kidney disease and corn(s) of both feet and painful mycotic toenails that are difficult to trim. Painful toenails interfere with ambulation. Aggravating factors include wearing enclosed shoe gear. Pain is relieved with periodic professional debridement. Painful corns are aggravated when weightbearing when wearing enclosed shoe gear. Pain is relieved with periodic professional debridement.  Chief Complaint  Patient presents with   Nail Problem    "She usually cuts my toenails."   New problem(s): None.   PCP is Alba Cory, MD. Theron Arista 02/27/2023.  Allergies  Allergen Reactions   Sulfa Antibiotics Rash    Review of Systems: Negative except as noted in the HPI.  Objective: There were no vitals filed for this visit. Melanie Gray is a pleasant 83 y.o. female WD, WN in NAD. AAO x 3.  Vascular Examination: CFT <3 seconds b/l. DP/PT pulses faintly palpable b/l. Skin temperature gradient warm to warm b/l. No pain with calf compression. No ischemia or gangrene. No cyanosis or clubbing noted b/l. Varicosities present b/l.   Neurological Examination: Sensation grossly intact b/l with 10 gram monofilament. Vibratory sensation intact b/l.   Dermatological Examination: Pedal skin warm and supple, but dry b/l.  No open wounds. No interdigital macerations.  Toenails 1-5 b/l thick, discolored, elongated with subungual debris and pain on dorsal palpation.    Hyperkeratotic lesion(s) distal tip of left 3rd toe and dorsal PIPJ b/l 5th toes.  No erythema, no edema, no drainage, no fluctuance.  Musculoskeletal Examination: Muscle strength 5/5 to all lower extremity muscle groups bilaterally. Hammertoe deformity noted 2-5 b/l. Utilizes cane for mobility assistance.  Radiographs: None  Assessment/Plan: 1. Pain due to onychomycosis  of toenails of both feet   2. Corns   3. Type 2 diabetes mellitus with stage 3 chronic kidney disease, without long-term current use of insulin, unspecified whether stage 3a or 3b CKD (HCC)     -Consent given for treatment as described below: -Examined patient. -Continue foot and shoe inspections daily. Monitor blood glucose per PCP/Endocrinologist's recommendations. -Continue supportive shoe gear daily. -Mycotic toenails 1-5 bilaterally were debrided in length and girth with sterile nail nippers and dremel without incident. -Corn(s) bilateral 5th toes and L 3rd toe pared utilizing sterile scalpel blade without complication or incident. Total number debrided=3. -For dry skin, recommended daily use of Bag Balm Hand and Body Moistuizer which may be purchased at local drug store or on [employer]. -Continue toe crest to afftected digits daily for protection. -Patient/POA to call should there be question/concern in the interim.   Return in about 3 months (around 11/14/2023).  Freddie Breech, DPM      [place] LOCATION: [address], [place] [address]                   Office [phone]   [facility] LOCATION: [address] [place], [place] [address] Office [phone]

## 2023-09-10 DIAGNOSIS — R600 Localized edema: Secondary | ICD-10-CM | POA: Diagnosis not present

## 2023-09-10 DIAGNOSIS — I129 Hypertensive chronic kidney disease with stage 1 through stage 4 chronic kidney disease, or unspecified chronic kidney disease: Secondary | ICD-10-CM | POA: Diagnosis not present

## 2023-09-10 DIAGNOSIS — N1831 Chronic kidney disease, stage 3a: Secondary | ICD-10-CM | POA: Diagnosis not present

## 2023-09-10 LAB — COMPREHENSIVE METABOLIC PANEL WITH GFR: eGFR: 59

## 2023-11-04 ENCOUNTER — Ambulatory Visit: Payer: Self-pay | Admitting: Family Medicine

## 2023-11-19 ENCOUNTER — Ambulatory Visit (INDEPENDENT_AMBULATORY_CARE_PROVIDER_SITE_OTHER): Payer: Medicare Other | Admitting: Podiatry

## 2023-11-19 ENCOUNTER — Encounter: Payer: Self-pay | Admitting: Podiatry

## 2023-11-19 DIAGNOSIS — M79674 Pain in right toe(s): Secondary | ICD-10-CM | POA: Diagnosis not present

## 2023-11-19 DIAGNOSIS — N183 Chronic kidney disease, stage 3 unspecified: Secondary | ICD-10-CM

## 2023-11-19 DIAGNOSIS — B351 Tinea unguium: Secondary | ICD-10-CM | POA: Diagnosis not present

## 2023-11-19 DIAGNOSIS — L84 Corns and callosities: Secondary | ICD-10-CM

## 2023-11-19 DIAGNOSIS — M79675 Pain in left toe(s): Secondary | ICD-10-CM

## 2023-11-19 DIAGNOSIS — E1122 Type 2 diabetes mellitus with diabetic chronic kidney disease: Secondary | ICD-10-CM

## 2023-11-20 ENCOUNTER — Ambulatory Visit: Admitting: Family Medicine

## 2023-11-20 ENCOUNTER — Encounter: Payer: Self-pay | Admitting: Family Medicine

## 2023-11-20 VITALS — BP 132/74 | HR 74 | Resp 18 | Ht 66.0 in | Wt 134.2 lb

## 2023-11-20 DIAGNOSIS — N183 Chronic kidney disease, stage 3 unspecified: Secondary | ICD-10-CM

## 2023-11-20 DIAGNOSIS — R252 Cramp and spasm: Secondary | ICD-10-CM

## 2023-11-20 DIAGNOSIS — Z8719 Personal history of other diseases of the digestive system: Secondary | ICD-10-CM | POA: Diagnosis not present

## 2023-11-20 DIAGNOSIS — I129 Hypertensive chronic kidney disease with stage 1 through stage 4 chronic kidney disease, or unspecified chronic kidney disease: Secondary | ICD-10-CM | POA: Diagnosis not present

## 2023-11-20 DIAGNOSIS — M170 Bilateral primary osteoarthritis of knee: Secondary | ICD-10-CM | POA: Diagnosis not present

## 2023-11-20 DIAGNOSIS — N2889 Other specified disorders of kidney and ureter: Secondary | ICD-10-CM

## 2023-11-20 DIAGNOSIS — F09 Unspecified mental disorder due to known physiological condition: Secondary | ICD-10-CM | POA: Diagnosis not present

## 2023-11-20 DIAGNOSIS — M8580 Other specified disorders of bone density and structure, unspecified site: Secondary | ICD-10-CM | POA: Diagnosis not present

## 2023-11-20 DIAGNOSIS — N1831 Chronic kidney disease, stage 3a: Secondary | ICD-10-CM | POA: Diagnosis not present

## 2023-11-20 DIAGNOSIS — I10 Essential (primary) hypertension: Secondary | ICD-10-CM | POA: Diagnosis not present

## 2023-11-20 DIAGNOSIS — F3340 Major depressive disorder, recurrent, in remission, unspecified: Secondary | ICD-10-CM | POA: Diagnosis not present

## 2023-11-20 DIAGNOSIS — Z1231 Encounter for screening mammogram for malignant neoplasm of breast: Secondary | ICD-10-CM

## 2023-11-20 DIAGNOSIS — E785 Hyperlipidemia, unspecified: Secondary | ICD-10-CM | POA: Diagnosis not present

## 2023-11-20 DIAGNOSIS — E1122 Type 2 diabetes mellitus with diabetic chronic kidney disease: Secondary | ICD-10-CM | POA: Diagnosis not present

## 2023-11-20 LAB — POCT GLYCOSYLATED HEMOGLOBIN (HGB A1C): Hemoglobin A1C: 5.8 % — AB (ref 4.0–5.6)

## 2023-11-20 MED ORDER — AMLODIPINE BESYLATE 2.5 MG PO TABS: 2.5000 mg | ORAL_TABLET | Freq: Every day | ORAL | 1 refills | Status: DC

## 2023-11-20 MED ORDER — ATORVASTATIN CALCIUM 40 MG PO TABS: 40.0000 mg | ORAL_TABLET | Freq: Every day | ORAL | 1 refills | Status: DC

## 2023-11-20 MED ORDER — BACLOFEN 10 MG PO TABS
10.0000 mg | ORAL_TABLET | Freq: Every day | ORAL | 0 refills | Status: AC | PRN
Start: 2023-11-20 — End: ?

## 2023-11-20 NOTE — Progress Notes (Signed)
 Name: Melanie Gray   MRN: [id number]    DOB: [birth year]   Date:11/20/2023       Progress Note  Subjective  Chief Complaint  Chief Complaint  Patient presents with   Medical Management of Chronic Issues   Discussed the use of AI scribe software for clinical note transcription with the patient, who gave verbal consent to proceed.  History of Present Illness Melanie Gray is an 83 year old female with mild cognitive dysfunction who presents with concerns about memory deficits.  She is experiencing memory deficits, noting that her memory is 'not as good' as it used to be. She describes an incident where she forgot to relay a message from her daughter to her son shortly after a phone conversation. Her Mini-Mental State Examination score is 23, slightly below the normal range of 24. She mentions increased dependence on her family for daily activities, although she is still able to pay her bills. No significant emotional distress currently, despite a history of depression.  She has high blood pressure, diabetes, and chronic kidney disease stage 3A. Her diabetes is diet-controlled, diagnosed in May 2018 with a fasting sugar over 126. Her A1c is 5.8%, indicating well-controlled blood sugar levels. She takes amlodipine  2.5 mg for blood pressure and atorvastatin  40 mg for cholesterol management. Her kidney function is slightly below normal with a GFR of 59, and she has mild anemia related to chronic disease.  She experiences leg cramps  She uses a cane for mobility and applies Voltaren  gel for knee osteoarthritis pain relieve, along with Tylenol . Her legs cramp 'really bad' at times, occurring daily during some weeks. She has not seen a vascular surgeon recently for her varicose veins.  She has a history of osteopenia, with the last bone density test conducted in 2021 showing mild bone loss. She has not had a mammogram since 2021 but performs regular self-exams. She recently updated her eyeglasses and is up to  date with her eye exams for diabetes.    Patient Active Problem List   Diagnosis Date Noted   S/P laparotomy 12/08/2022   Sinus bradycardia 12/08/2022   Pain due to onychomycosis of toenails of both feet 08/30/2020   Injury of toenail of right foot 08/30/2020   Depression, major, recurrent, in remission (HCC) 08/10/2017   Varicose veins of both lower extremities with inflammation 12/01/2016   Chronic venous insufficiency 12/01/2016   Benign hypertension 11/06/2014   Chronic anemia 11/06/2014   Seborrheic dermatitis 11/06/2014   Type 2 diabetes mellitus with renal complication (HCC) 11/06/2014   Dyslipidemia 11/06/2014   Cephalalgia 11/06/2014   Deafness, sensorineural 11/06/2014   Arthritis of knee, degenerative 11/06/2014   Panic attack 11/06/2014   Restless legs syndrome 11/06/2014   Allergic rhinitis 11/06/2014   Spinal stenosis 11/06/2014   Osteopenia 05/09/2009   Chronic kidney disease, stage 3a (HCC) 04/20/2008    Past Surgical History:  Procedure Laterality Date   ABDOMINAL HYSTERECTOMY     BOWEL RESECTION N/A 12/07/2022   Procedure: SMALL BOWEL RESECTION;  Surgeon: Alben Alma, MD;  Location: [facility] ORS;  Service: General;  Laterality: N/A;   CATARACT EXTRACTION W/PHACO Left 07/16/2017   Procedure: CATARACT EXTRACTION PHACO AND INTRAOCULAR LENS PLACEMENT (IOC);  Surgeon: Rosa College, MD;  Location: [facility] OR[facility];  Service: Ophthalmology;  Laterality: Left;  US  01:27.0 AP% 21.0 CDE 18.28 Fluid Pack Lot # [id number]   CHOLECYSTECTOMY     HERNIA REPAIR     LAPAROTOMY N/A 12/07/2022   Procedure: EXPLORATORY  LAPAROTOMY;  Surgeon: Alben Alma, MD;  Location: [facility] OR[facility];  Service: General;  Laterality: N/A;    Family History  Problem Relation Age of Onset   Cancer Mother    Diabetes Father    Cancer Sister    Breast cancer Neg Hx     Social History   Tobacco Use   Smoking status: Never    Passive exposure: Never   Smokeless tobacco: Never  Substance Use Topics    Alcohol use: No    Alcohol/week: 0.0 standard drinks of alcohol     Current Outpatient Medications:    acetaminophen  (TYLENOL ) 500 MG tablet, Take 1,000 mg by mouth daily as needed for moderate pain or headache., Disp: , Rfl:    amLODipine  (NORVASC ) 2.5 MG tablet, Take 1 tablet (2.5 mg total) by mouth daily., Disp: 90 tablet, Rfl: 1   aspirin EC 81 MG tablet, Take 81 mg by mouth daily., Disp: , Rfl:    atorvastatin  (LIPITOR) 40 MG tablet, Take 1 tablet (40 mg total) by mouth daily., Disp: 90 tablet, Rfl: 1   cholecalciferol (VITAMIN D3) 25 MCG (1000 UNIT) tablet, Take 1 tablet (1,000 Units total) by mouth daily., Disp: , Rfl:    diclofenac  Sodium (VOLTAREN ) 1 % GEL, Apply 4 g topically 4 (four) times daily. (Patient taking differently: Apply 4 g topically 4 (four) times daily as needed (pain).), Disp: 300 g, Rfl: 1   feeding supplement (ENSURE ENLIVE / ENSURE PLUS) LIQD, Take 237 mLs by mouth 3 (three) times daily between meals., Disp: 237 mL, Rfl: 12   Glucosamine-Chondroitin (OSTEO BI-FLEX REGULAR STRENGTH PO), Take 1 tablet by mouth daily., Disp: , Rfl:    Multiple Vitamin (MULTI-VITAMINS) TABS, Take 1 tablet by mouth every evening. , Disp: , Rfl:    Polyethyl Glycol-Propyl Glycol (SYSTANE OP), Apply 1 drop to eye daily as needed (dry eyes)., Disp: , Rfl:   Allergies  Allergen Reactions   Sulfa Antibiotics Rash    I personally reviewed active problem list, medication list, allergies, family history with the patient/caregiver today.   ROS  Ten systems reviewed and is negative except as mentioned in HPI    Objective Physical Exam Constitutional: Patient appears well-developed and frail  No distress.  HEENT: head atraumatic, normocephalic, pupils equal and reactive to light, neck supple Cardiovascular: Normal rate, regular rhythm and normal heart sounds.  No murmur heard. No BLE edema. Pulmonary/Chest: Effort normal and breath sounds normal. No respiratory distress. Abdominal:  Soft.  There is no tenderness. Muscular skeletal: slow gait, uses a cane, bilateral knee effusion  Psychiatric: Patient has a normal mood and affect. behavior is normal. Judgment and thought content normal.    Vitals:   11/20/23 1033  BP: 132/74  Pulse: 74  Resp: 18  SpO2: 94%  Weight: 134 lb 3.2 oz (60.9 kg)  Height: 5\' 6"  (1.676 m)    Body mass index is 21.66 kg/m.  Recent Results (from the past 2160 hours)  POCT glycosylated hemoglobin (Hb A1C)     Status: Abnormal   Collection Time: 11/20/23 10:45 AM  Result Value Ref Range   Hemoglobin A1C 5.8 (A) 4.0 - 5.6 %   HbA1c POC (<> result, manual entry)     HbA1c, POC (prediabetic range)     HbA1c, POC (controlled diabetic range)        PHQ2/9:    11/20/2023   10:32 AM 07/01/2023   11:35 AM 02/27/2023   11:18 AM 02/06/2023   11:27 AM 12/23/2022  2:26 PM  Depression screen PHQ 2/9  Decreased Interest 0 0 0 0 1  Down, Depressed, Hopeless 0 0 0 0 1  PHQ - 2 Score 0 0 0 0 2  Altered sleeping 0 0 0  0  Tired, decreased energy 0 0 0  1  Change in appetite 0 1 3  0  Feeling bad or failure about yourself  0 0 0  0  Trouble concentrating 0 0 0  0  Moving slowly or fidgety/restless 0 1 0  0  Suicidal thoughts 0 0 0  0  PHQ-9 Score 0 2 3  3   Difficult doing work/chores Not difficult at all Not difficult at all  Not difficult at all Not difficult at all    phq 9 is negative  Fall Risk:    11/20/2023   10:21 AM 07/01/2023   11:35 AM 02/27/2023   11:18 AM 02/06/2023   11:23 AM 08/29/2022   10:41 AM  Fall Risk   Falls in the past year? 0 0 0 0 0  Number falls in past yr: 0 0 0 0 0  Injury with Fall? 0 0 0 0 0  Risk for fall due to : No Fall Risks  Impaired balance/gait No Fall Risks Impaired balance/gait  Follow up Falls prevention discussed;Education provided;Falls evaluation completed  Falls prevention discussed Education provided;Falls prevention discussed Falls prevention discussed      Assessment & Plan Chronic  kidney disease, stage 3A Chronic kidney disease stage 3A with GFR 59 and mild anemia. Blood pressure control essential for kidney protection. - Monitor kidney function and GFR. - Ensure blood pressure is controlled.  Hypertension Hypertension managed with amlodipine  2.5 mg. Blood pressure control crucial for kidney protection. - Continue amlodipine  2.5 mg daily.  Type 2 diabetes mellitus with CKI stage IIIa, HTN, Onychomycosis  Type 2 diabetes mellitus, diet-controlled, A1c 5.8%. Contributing to chronic kidney disease and dyslipidemia. - Continue diabetic diet. - Monitor blood glucose levels.  Osteoarthritis of knee Severe osteoarthritis of knees causing significant pain and swelling. Managed with Tylenol  and Voltaren  gel. - Continue Tylenol  as needed for pain. - Continue Voltaren  gel for knee pain.  Leg cramps Severe leg cramps possibly related to renal disease. Prefers non-pharmacological management initially. - Try Gatorade Zero and massage for cramps. - Prescribe baclofen as needed for severe cramps, cautioning about drowsiness.  Osteopenia Osteopenia noted on bone density scan in 2021. Bone health monitoring important. - Order bone density scan.  Mild cognitive dysfunction Mild cognitive dysfunction with mini-mental status score 23. Prefers monitoring for progression. - Monitor cognitive function. - Discuss treatment options if symptoms worsen.  Onychomycosis associated with DM Onychomycosis of toenails managed with regular podiatrist visits. - Continue follow-up with podiatrist every 4-6 months.

## 2023-11-23 NOTE — Progress Notes (Signed)
  Subjective:  Patient ID: Melanie Gray, female    DOB: 11-04-1940,  MRN: [id number]  Jonesha Ciervo presents to clinic today for at risk foot care. Pt has h/o NIDDM with chronic kidney disease and corn(s) b/l feet and painful mycotic toenails that are difficult to trim. Painful toenails interfere with ambulation. Aggravating factors include wearing enclosed shoe gear. Pain is relieved with periodic professional debridement. Painful corns are aggravated when weightbearing when wearing enclosed shoe gear. Pain is relieved with periodic professional debridement.  Chief Complaint  Patient presents with   Diabetes    "She works on my nails."   New problem(s): None.   PCP is Arleen Lacer, MD.  Allergies  Allergen Reactions   Sulfa Antibiotics Rash    Review of Systems: Negative except as noted in the HPI.  Objective: No changes noted in today's physical examination. There were no vitals filed for this visit. Lanise Sedano is a pleasant 83 y.o. female WD, WN in NAD. AAO x 3.  Vascular Examination: CFT <3 seconds b/l. DP/PT pulses faintly palpable b/l. Skin temperature gradient warm to warm b/l. No pain with calf compression. No ischemia or gangrene. No cyanosis or clubbing noted b/l.    Neurological Examination: Sensation grossly intact b/l with 10 gram monofilament. Vibratory sensation intact b/l.   Dermatological Examination: Pedal skin warm and supple b/l.   No open wounds. No interdigital macerations.  Toenails 1-5 b/l thick, discolored, elongated with subungual debris and pain on dorsal palpation.    Hyperkeratotic lesion(s) L 5th toe and R 5th toe.  No erythema, no edema, no drainage, no fluctuance.  Musculoskeletal Examination: Muscle strength 5/5 to all lower extremity muscle groups bilaterally. Hammertoe deformity noted 2-5 b/l. Utilizes cane for ambulation assistance.  Radiographs: None Last A1c:      Latest Ref Rng & Units 11/20/2023   10:45 AM 07/01/2023   11:49 AM 02/27/2023    11:20 AM  Hemoglobin A1C  Hemoglobin-A1c 4.0 - 5.6 % 5.8  5.8  5.6    Assessment/Plan: 1. Pain due to onychomycosis of toenails of both feet   2. Corns   3. Type 2 diabetes mellitus with stage 3 chronic kidney disease, without long-term current use of insulin , unspecified whether stage 3a or 3b CKD (HCC)     Consent given for treatment. Patient examined. All patient's and/or POA's questions/concerns addressed on today's visit. Toenails 1-5 debrided in length and girth without incident. Corn(s) bilateral 5th toes pared with sharp debridement without incident. Continue foot and shoe inspections daily. Monitor blood glucose per PCP/Endocrinologist's recommendations.Continue soft, supportive shoe gear daily. Report any pedal injuries to medical professional. Call office if there are any questions/concerns. -Patient/POA to call should there be question/concern in the interim.   Return in about 3 months (around 02/19/2024).  Luella Sager, DPM      [place] LOCATION: [address] [place] [address]                   Office [phone]   [facility] LOCATION: [address] [place], [place] [address] Office [phone]

## 2024-02-22 ENCOUNTER — Encounter: Payer: Self-pay | Admitting: Podiatry

## 2024-02-22 ENCOUNTER — Ambulatory Visit (INDEPENDENT_AMBULATORY_CARE_PROVIDER_SITE_OTHER): Admitting: Podiatry

## 2024-02-22 DIAGNOSIS — L84 Corns and callosities: Secondary | ICD-10-CM | POA: Diagnosis not present

## 2024-02-22 DIAGNOSIS — M79675 Pain in left toe(s): Secondary | ICD-10-CM

## 2024-02-22 DIAGNOSIS — B351 Tinea unguium: Secondary | ICD-10-CM | POA: Diagnosis not present

## 2024-02-22 DIAGNOSIS — E1122 Type 2 diabetes mellitus with diabetic chronic kidney disease: Secondary | ICD-10-CM

## 2024-02-22 DIAGNOSIS — M79674 Pain in right toe(s): Secondary | ICD-10-CM

## 2024-02-22 DIAGNOSIS — N183 Chronic kidney disease, stage 3 unspecified: Secondary | ICD-10-CM

## 2024-02-26 NOTE — Progress Notes (Signed)
  Subjective:  Patient ID: Melanie Gray, female    DOB: 1940-11-14,  MRN: [id number]  Melanie Gray presents to clinic today for at risk foot care. Pt has h/o NIDDM with chronic kidney disease and corn(s) of both feet and painful thick toenails that are difficult to trim. Painful toenails interfere with ambulation. Aggravating factors include wearing enclosed shoe gear. Pain is relieved with periodic professional debridement. Painful corns are aggravated when weightbearing when wearing enclosed shoe gear. Pain is relieved with periodic professional debridement.   New problem(s): None.   PCP is Glenard Mire, MD. Melanie Gray 11/20/2023.  Allergies  Allergen Reactions   Sulfa Antibiotics Rash    Review of Systems: Negative except as noted in the HPI.  Objective: No changes noted in today's physical examination. There were no vitals filed for this visit. Melanie Gray is a pleasant 83 y.o. female thin build in NAD. AAO x 3.  Vascular Examination: CFT <3 seconds b/l. DP/PT pulses faintly palpable b/l. Digital hair diminished.  Skin temperature gradient warm to warm b/l. No pain with calf compression. No ischemia or gangrene. No cyanosis or clubbing noted b/l. No edema noted b/l LE.   Neurological Examination: Sensation grossly intact b/l with 10 gram monofilament. Vibratory sensation intact b/l.   Dermatological Examination: Pedal skin warm and supple b/l.   No open wounds. No interdigital macerations.  Toenails 1-5 b/l thick, discolored, elongated with subungual debris and pain on dorsal palpation.    Hyperkeratotic lesion(s) dorsal PIPJ of bilateral 5th toes.  No erythema, no edema, no drainage, no fluctuance.  Musculoskeletal Examination: Muscle strength 5/5 to all lower extremity muscle groups bilaterally. No pain, crepitus or joint limitation noted with ROM b/l LE. Hammertoe deformity noted 2-5 b/l.Melanie Gray Patient ambulates with cane assistance.  Radiographs: None  Last A1c:      Latest Ref Rng &  Units 11/20/2023   10:45 AM 07/01/2023   11:49 AM 02/27/2023   11:20 AM  Hemoglobin A1C  Hemoglobin-A1c 4.0 - 5.6 % 5.8  5.8  5.6     Assessment/Plan: 1. Pain due to onychomycosis of toenails of both feet   2. Corns   3. Type 2 diabetes mellitus with stage 3 chronic kidney disease, without long-term current use of insulin , unspecified whether stage 3a or 3b CKD (HCC)    Patient was evaluated and treated. All patient's and/or POA's questions/concerns addressed on today's visit. Mycotic toenails 1-5 debrided in length and girth without incident. Corn(s) dorsal PIPJ of bilateral 5th toes pared with sharp debridement without incident. Continue daily foot inspections and monitor blood glucose per PCP/Endocrinologist's recommendations. Continue soft, supportive shoe gear daily. Report any pedal injuries to medical professional. Call office if there are any questions/concerns.  Return in about 3 months (around 05/24/2024).  Melanie Gray, DPM      [place] LOCATION: [address], [place] [address]                   Office [phone]   [facility] LOCATION: [address] [place], [place] [address] Office [phone]

## 2024-05-23 ENCOUNTER — Ambulatory Visit (INDEPENDENT_AMBULATORY_CARE_PROVIDER_SITE_OTHER): Admitting: Podiatry

## 2024-05-23 DIAGNOSIS — Z91198 Patient's noncompliance with other medical treatment and regimen for other reason: Secondary | ICD-10-CM

## 2024-05-23 NOTE — Progress Notes (Signed)
 1. Failure to attend appointment with reason given    Appt rescheduled by patient.

## 2024-05-25 ENCOUNTER — Ambulatory Visit: Admitting: Family Medicine

## 2024-07-12 ENCOUNTER — Ambulatory Visit: Admitting: Family Medicine

## 2024-07-12 ENCOUNTER — Encounter: Payer: Self-pay | Admitting: Family Medicine

## 2024-07-12 ENCOUNTER — Ambulatory Visit (INDEPENDENT_AMBULATORY_CARE_PROVIDER_SITE_OTHER): Admitting: Family Medicine

## 2024-07-12 VITALS — BP 144/84 | HR 79 | Resp 16 | Ht 66.0 in | Wt 130.8 lb

## 2024-07-12 DIAGNOSIS — Z1231 Encounter for screening mammogram for malignant neoplasm of breast: Secondary | ICD-10-CM

## 2024-07-12 DIAGNOSIS — I129 Hypertensive chronic kidney disease with stage 1 through stage 4 chronic kidney disease, or unspecified chronic kidney disease: Secondary | ICD-10-CM | POA: Diagnosis not present

## 2024-07-12 DIAGNOSIS — F09 Unspecified mental disorder due to known physiological condition: Secondary | ICD-10-CM | POA: Diagnosis not present

## 2024-07-12 DIAGNOSIS — N1831 Chronic kidney disease, stage 3a: Secondary | ICD-10-CM

## 2024-07-12 DIAGNOSIS — M25512 Pain in left shoulder: Secondary | ICD-10-CM | POA: Diagnosis not present

## 2024-07-12 DIAGNOSIS — G8929 Other chronic pain: Secondary | ICD-10-CM

## 2024-07-12 DIAGNOSIS — E785 Hyperlipidemia, unspecified: Secondary | ICD-10-CM

## 2024-07-12 DIAGNOSIS — E1169 Type 2 diabetes mellitus with other specified complication: Secondary | ICD-10-CM | POA: Diagnosis not present

## 2024-07-12 DIAGNOSIS — Z23 Encounter for immunization: Secondary | ICD-10-CM | POA: Diagnosis not present

## 2024-07-12 DIAGNOSIS — N183 Chronic kidney disease, stage 3 unspecified: Secondary | ICD-10-CM

## 2024-07-12 DIAGNOSIS — E1122 Type 2 diabetes mellitus with diabetic chronic kidney disease: Secondary | ICD-10-CM | POA: Diagnosis not present

## 2024-07-12 DIAGNOSIS — M170 Bilateral primary osteoarthritis of knee: Secondary | ICD-10-CM

## 2024-07-12 DIAGNOSIS — M8580 Other specified disorders of bone density and structure, unspecified site: Secondary | ICD-10-CM | POA: Diagnosis not present

## 2024-07-12 DIAGNOSIS — Z780 Asymptomatic menopausal state: Secondary | ICD-10-CM

## 2024-07-12 LAB — POCT GLYCOSYLATED HEMOGLOBIN (HGB A1C): Hemoglobin A1C: 5.6 % (ref 4.0–5.6)

## 2024-07-12 LAB — HEPATIC FUNCTION PANEL
AG Ratio: 1.5 (calc) (ref 1.0–2.5)
ALT: 15 U/L (ref 6–29)
AST: 30 U/L (ref 10–35)
Albumin: 4.4 g/dL (ref 3.6–5.1)
Alkaline phosphatase (APISO): 86 U/L (ref 37–153)
Bilirubin, Direct: 0.2 mg/dL (ref 0.0–0.2)
Globulin: 2.9 g/dL (ref 1.9–3.7)
Indirect Bilirubin: 0.9 mg/dL (ref 0.2–1.2)
Total Bilirubin: 1.1 mg/dL (ref 0.2–1.2)
Total Protein: 7.3 g/dL (ref 6.1–8.1)

## 2024-07-12 LAB — LIPID PANEL
Cholesterol: 161 mg/dL
HDL: 89 mg/dL
LDL Cholesterol (Calc): 61 mg/dL
Non-HDL Cholesterol (Calc): 72 mg/dL
Total CHOL/HDL Ratio: 1.8 (calc)
Triglycerides: 39 mg/dL

## 2024-07-12 MED ORDER — AMLODIPINE BESYLATE 2.5 MG PO TABS: 2.5000 mg | ORAL_TABLET | Freq: Every day | ORAL | 1 refills | Status: AC

## 2024-07-12 MED ORDER — ATORVASTATIN CALCIUM 40 MG PO TABS: 40.0000 mg | ORAL_TABLET | Freq: Every day | ORAL | 1 refills | Status: AC
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
## 2024-07-15 ENCOUNTER — Ambulatory Visit: Payer: Self-pay | Admitting: Family Medicine

## 2024-07-20 ENCOUNTER — Telehealth: Payer: Self-pay

## 2024-07-20 NOTE — Telephone Encounter (Signed)
 Reena pt daughter informed, she states they are completely aware of [facility] been trying to contact them.

## 2024-07-20 NOTE — Telephone Encounter (Signed)
 Copied from CRM [id number]. Topic: General - Other >> Jul 19, 2024  3:51 PM Kevelyn M wrote: Reason for CRM: Hillary with [facility] calling because the patient will not pick up the phone. Wanted to see if a nurse can call her and check on her and let her know that [facility] is trying help.  Call back #[phone]

## 2024-07-25 ENCOUNTER — Ambulatory Visit (INDEPENDENT_AMBULATORY_CARE_PROVIDER_SITE_OTHER): Admitting: Podiatry

## 2024-07-25 ENCOUNTER — Ambulatory Visit: Admitting: Podiatry

## 2024-07-25 ENCOUNTER — Encounter: Payer: Self-pay | Admitting: Podiatry

## 2024-07-25 DIAGNOSIS — N1831 Chronic kidney disease, stage 3a: Secondary | ICD-10-CM

## 2024-07-25 DIAGNOSIS — B351 Tinea unguium: Secondary | ICD-10-CM

## 2024-07-25 DIAGNOSIS — Z91198 Patient's noncompliance with other medical treatment and regimen for other reason: Secondary | ICD-10-CM

## 2024-07-25 DIAGNOSIS — L84 Corns and callosities: Secondary | ICD-10-CM

## 2024-07-25 NOTE — Progress Notes (Signed)
 1. Failure to attend appointment with reason given    Patient rescheduled to later time slot today.

## 2024-08-01 ENCOUNTER — Encounter: Payer: Self-pay | Admitting: Podiatry

## 2024-08-01 NOTE — Progress Notes (Signed)
"  °  Subjective:  Patient ID: Melanie Gray, female    DOB: May 29, 1941,  MRN: [id number]  Melanie Gray presents to clinic today for for annual diabetic foot examination, at risk foot care. Pt has h/o NIDDM with chronic kidney disease, and corn(s) b/l lower extremities and painful mycotic toenails that are difficult to trim. Painful toenails interfere with ambulation. Aggravating factors include wearing enclosed shoe gear. Pain is relieved with periodic professional debridement. Painful corns are aggravated when weightbearing when wearing enclosed shoe gear. Pain is relieved with periodic professional debridement.  Chief Complaint  Patient presents with   Nail Problem    She saw Dr. Glenard in Dec. She denies being diabetic   New problem(s): None.   PCP is Glenard Mire, MD.  Allergies[1]  Review of Systems: Negative except as noted in the HPI.  Objective: No changes noted in today's physical examination. There were no vitals filed for this visit. Melanie Gray is a pleasant 84 y.o. female WD, WN in NAD. AAO x 3.   Diabetic foot exam was performed with the following findings:   Vascular Examination: CFT <3 seconds b/l. DP/PT pulses faintly palpable b/l. Pedal edema absent. Skin temperature gradient warm to warm b/l. Digital hair absent. No pain with calf compression. No ischemia or gangrene. No cyanosis or clubbing noted b/l.    Neurological Examination: Sensation grossly intact b/l with 10 gram monofilament. Vibratory sensation intact b/l.   Dermatological Examination: Pedal skin warm and supple b/l.   No open wounds. No interdigital macerations.  Toenails 1-5 b/l thick, discolored, elongated with subungual debris and pain on dorsal palpation.    Hyperkeratotic lesion(s) dorsal PIPJ bilateral 5th digits.  No erythema, no edema, no drainage, no fluctuance.  Musculoskeletal Examination: Muscle strength 5/5 to all lower extremity muscle groups bilaterally. Hammertoe(s) 2-5 b/l.SABRA No pain,  crepitus or joint limitation noted with ROM b/l LE.  Patient ambulates with cane assistance.  Radiographs: None   Assessment/Plan: 1. Pain due to onychomycosis of toenails of both feet   2. Corns   3. Type 2 diabetes mellitus with stage 3a chronic kidney disease, without long-term current use of insulin  [facility])   Consent given for treatment. Patient examined. All patient's and/or POA's questions/concerns addressed on today's visit. Toenails 1-5 b/l debrided in length and girth without incident. Corn(s) dorsal PIPJ bilateral 5th digits pared with sharp debridement without incident. Continue foot and shoe inspections daily. Monitor blood glucose per PCP/Endocrinologist's recommendations.Continue soft, supportive shoe gear daily. Report any pedal injuries to medical professional. Call office if there are any questions/concerns. Return in about 3 months (around 10/23/2024).  Melanie Gray, DPM      [place] LOCATION: [address] [place] [address]                   Office [phone]   [facility] LOCATION: [address] [place], [place] [address] Office [phone]     [1]  Allergies Allergen Reactions   Sulfa Antibiotics Rash   "

## 2024-08-04 ENCOUNTER — Ambulatory Visit: Payer: Self-pay

## 2024-08-04 DIAGNOSIS — Z0000 Encounter for general adult medical examination without abnormal findings: Secondary | ICD-10-CM | POA: Diagnosis not present

## 2024-08-04 NOTE — Progress Notes (Signed)
 "  Chief Complaint  Patient presents with   Medicare Wellness     Subjective:   Melanie Gray is a 84 y.o. female who presents for a Medicare Annual Wellness Visit.  Visit info / Clinical Intake: Medicare Wellness Visit Type:: Subsequent Annual Wellness Visit Persons participating in visit and providing information:: patient Medicare Wellness Visit Mode:: Telephone If telephone:: video declined Since this visit was completed virtually, some vitals may be partially provided or unavailable. Missing vitals are due to the limitations of the virtual format.: Unable to obtain vitals - no equipment If Telephone or Video please confirm:: I connected with patient using audio/video enable telemedicine. I verified patient identity with two identifiers, discussed telehealth limitations, and patient agreed to proceed. Patient Location:: HOME Provider Location:: OFFICE Interpreter Needed?: No Pre-visit prep was completed: yes AWV questionnaire completed by patient prior to visit?: no Living arrangements:: with family/others Patient's Overall Health Status Rating: (!) fair Typical amount of pain: some Does pain affect daily life?: no Are you currently prescribed opioids?: no  Dietary Habits and Nutritional Risks How many meals a day?: 2 Eats fruit and vegetables daily?: (!) no Most meals are obtained by: preparing own meals In the last 2 weeks, have you had any of the following?: none Diabetic:: no  Functional Status Activities of Daily Living (to include ambulation/medication): Independent Ambulation: Independent Medication Administration: Independent Home Management (perform basic housework or laundry): Independent Manage your own finances?: yes Primary transportation is: family / friends Concerns about vision?: no *vision screening is required for WTM* (RX GLASSES-  EYE) Concerns about hearing?: no  Fall Screening Falls in the past year?: 0 Number of falls in past year: 0 Was  there an injury with Fall?: 0 Fall Risk Category Calculator: 0 Patient Fall Risk Level: Low Fall Risk  Fall Risk Patient at Risk for Falls Due to: History of fall(s) Fall risk Follow up: Falls evaluation completed; Falls prevention discussed  Home and Transportation Safety: All rugs have non-skid backing?: yes All stairs or steps have railings?: yes Grab bars in the bathtub or shower?: yes Have non-skid surface in bathtub or shower?: yes Good home lighting?: yes Regular seat belt use?: yes Hospital stays in the last year:: (!) yes How many hospital stays:: 1 Reason: INTESTINAL BLOCKAGE  Cognitive Assessment Difficulty concentrating, remembering, or making decisions? : yes (MEMORY) Will 6CIT or Mini Cog be Completed: yes What year is it?: 0 points What month is it?: 0 points Give patient an address phrase to remember (5 components): [address] [place],  About what time is it?: 3 points Count backwards from 20 to 1: 0 points Say the months of the year in reverse: 4 points Repeat the address phrase from earlier: 10 points 6 CIT Score: 17 points  Advance Directives (For Healthcare) Does Patient Have a Medical Advance Directive?: No Would patient like information on creating a medical advance directive?: No - Patient declined  Reviewed/Updated  Reviewed/Updated: Reviewed All (Medical, Surgical, Family, Medications, Allergies, Care Teams, Patient Goals)    Allergies (verified) Sulfa antibiotics   Current Medications (verified) Outpatient Encounter Medications as of 08/04/2024  Medication Sig   acetaminophen  (TYLENOL ) 500 MG tablet Take 1,000 mg by mouth daily as needed for moderate pain or headache.   amLODipine  (NORVASC ) 2.5 MG tablet Take 1 tablet (2.5 mg total) by mouth daily.   aspirin EC 81 MG tablet Take 81 mg by mouth daily.   atorvastatin  (LIPITOR) 40 MG tablet Take 1 tablet (40 mg total)  by mouth daily.   cholecalciferol (VITAMIN D3) 25 MCG (1000 UNIT)  tablet Take 1 tablet (1,000 Units total) by mouth daily.   diclofenac  Sodium (VOLTAREN ) 1 % GEL Apply 4 g topically 4 (four) times daily.   feeding supplement (ENSURE ENLIVE / ENSURE PLUS) LIQD Take 237 mLs by mouth 3 (three) times daily between meals.   Glucosamine-Chondroitin (OSTEO BI-FLEX REGULAR STRENGTH PO) Take 1 tablet by mouth daily.   Multiple Vitamin (MULTI-VITAMINS) TABS Take 1 tablet by mouth every evening.    Polyethyl Glycol-Propyl Glycol (SYSTANE OP) Apply 1 drop to eye daily as needed (dry eyes).   baclofen  (LIORESAL ) 10 MG tablet Take 1 tablet (10 mg total) by mouth daily as needed for muscle spasms. (Patient not taking: Reported on 08/04/2024)   No facility-administered encounter medications on file as of 08/04/2024.    History: Past Medical History:  Diagnosis Date   Chronic kidney disease    RENAL INSUFF   Complication of anesthesia    HA , N/V   Diabetes mellitus without complication (HCC)    Hyperlipidemia    Hypertension    Knee instability    RIGHT KNEE WEAKNESS   PONV (postoperative nausea and vomiting)    Vertigo    Past Surgical History:  Procedure Laterality Date   ABDOMINAL HYSTERECTOMY     BOWEL RESECTION N/A 12/07/2022   Procedure: SMALL BOWEL RESECTION;  Surgeon: Jordis Laneta FALCON, MD;  Location: [facility] OR[facility];  Service: General;  Laterality: N/A;   CATARACT EXTRACTION W/PHACO Left 07/16/2017   Procedure: CATARACT EXTRACTION PHACO AND INTRAOCULAR LENS PLACEMENT (IOC);  Surgeon: Myrna Adine Anes, MD;  Location: [facility];  Service: Ophthalmology;  Laterality: Left;  US  01:27.0 AP% 21.0 CDE 18.28 Fluid Pack Lot # [id number]   CHOLECYSTECTOMY     HERNIA REPAIR     LAPAROTOMY N/A 12/07/2022   Procedure: EXPLORATORY LAPAROTOMY;  Surgeon: Jordis Laneta FALCON, MD;  Location: [facility];  Service: General;  Laterality: N/A;   Family History  Problem Relation Age of Onset   Cancer Mother    Diabetes Father    Cancer Sister    Breast cancer Neg Hx    Social History    Occupational History    Employer: RETIRED  Tobacco Use   Smoking status: Never    Passive exposure: Never   Smokeless tobacco: Never  Vaping Use   Vaping status: Never Used  Substance and Sexual Activity   Alcohol use: No    Alcohol/week: 0.0 standard drinks of alcohol   Drug use: No   Sexual activity: Not Currently   Tobacco Counseling Counseling given: Not Answered  SDOH Screenings   Food Insecurity: No Food Insecurity (08/04/2024)  Housing: Unknown (08/04/2024)  Transportation Needs: No Transportation Needs (08/04/2024)  Utilities: Not At Risk (08/04/2024)  Alcohol Screen: Low Risk (02/06/2023)  Depression (PHQ2-9): Low Risk (08/04/2024)  Financial Resource Strain: Low Risk (02/06/2023)  Physical Activity: Inactive (08/04/2024)  Social Connections: Socially Isolated (08/04/2024)  Stress: No Stress Concern Present (08/04/2024)  Tobacco Use: Low Risk (08/04/2024)  Health Literacy: Adequate Health Literacy (08/04/2024)   See flowsheets for full screening details  Depression Screen PHQ 2 & 9 Depression Scale- Over the past 2 weeks, how often have you been bothered by any of the following problems? Little interest or pleasure in doing things: 0 Feeling down, depressed, or hopeless (PHQ Adolescent also includes...irritable): 0 PHQ-2 Total Score: 0 Trouble falling or staying asleep, or sleeping too much: 0 Feeling tired or having little energy:  0 Poor appetite or overeating (PHQ Adolescent also includes...weight loss): 0 Feeling bad about yourself - or that you are a failure or have let yourself or your family down: 0 Trouble concentrating on things, such as reading the newspaper or watching television (PHQ Adolescent also includes...like school work): 0 Moving or speaking so slowly that other people could have noticed. Or the opposite - being so fidgety or restless that you have been moving around a lot more than usual: 0 Thoughts that you would be better off dead, or of hurting  yourself in some way: 0 PHQ-9 Total Score: 0 If you checked off any problems, how difficult have these problems made it for you to do your work, take care of things at home, or get along with other people?: Not difficult at all  Depression Treatment Depression Interventions/Treatment : [id number] Score <4 Follow-up Not Indicated     Goals Addressed             This Visit's Progress    DIET - EAT MORE FRUITS AND VEGETABLES               Objective:    There were no vitals filed for this visit. There is no height or weight on file to calculate BMI.  Hearing/Vision screen No results found. Immunizations and Health Maintenance Health Maintenance  Topic Date Due   OPHTHALMOLOGY EXAM  07/23/2018   Mammogram  11/22/2020   Medicare Annual Wellness (AWV)  02/06/2024   COVID-19 Vaccine (6 - 2025-26 season) 03/14/2024   Diabetic kidney evaluation - Urine ACR  07/05/2024   Diabetic kidney evaluation - eGFR measurement  09/09/2024   HEMOGLOBIN A1C  01/10/2025   FOOT EXAM  07/25/2025   Pneumococcal Vaccine: 50+ Years  Completed   Influenza Vaccine  Completed   Bone Density Scan  Completed   Zoster Vaccines- Shingrix  Completed   Meningococcal B Vaccine  Aged Out   DTaP/Tdap/Td  Discontinued        Assessment/Plan:  This is a routine wellness examination for [facility].  Patient Care Team: Sowles, Krichna, MD as PCP - General (Family Medicine) Myrna Adine Anes, MD as Consulting Physician (Ophthalmology) Dennise Capri, MD (Nephrology) Loreda Hacker, DPM as Consulting Physician (Podiatry)  I have personally reviewed and noted the following in the patients chart:   Medical and social history Use of alcohol, tobacco or illicit drugs  Current medications and supplements including opioid prescriptions. Functional ability and status Nutritional status Physical activity Advanced directives List of other physicians Hospitalizations, surgeries, and ER visits in previous 12  months Vitals Screenings to include cognitive, depression, and falls Referrals and appointments  No orders of the defined types were placed in this encounter.  In addition, I have reviewed and discussed with patient certain preventive protocols, quality metrics, and best practice recommendations. A written personalized care plan for preventive services as well as general preventive health recommendations were provided to patient.   Jhonnie GORMAN Das, LPN   [id number]   Return in about 1 year (around 08/04/2025).  After Visit Summary: (MyChart) Due to this being a telephonic visit, the after visit summary with patients personalized plan was offered to patient via MyChart   Nurse Notes: UTD ON SHOTS EXCEPT TDAP; APPT FOR MAMMOGRAM 1/29; AGED OUT OF BDS, COLONOSCOPY  No voiced or noted concerns at this time  "

## 2024-08-04 NOTE — Patient Instructions (Addendum)
 [name],  Thank you for taking the time for your Medicare Wellness Visit. I appreciate your continued commitment to your health goals. Please review the care plan we discussed, and feel free to reach out if I can assist you further.  Please note that Annual Wellness Visits do not include a physical exam. Some assessments may be limited, especially if the visit was conducted virtually. If needed, we may recommend an in-person follow-up with your provider.  Ongoing Care Seeing your primary care provider every 3 to 6 months helps us  monitor your health and provide consistent, personalized care. APPT. 01/11/25 @ 10:20 AM W/ DR.SOWLES  Referrals If a referral was made during today's visit and you haven't received any updates within two weeks, please contact the referred provider directly to check on the status.  Recommended Screenings:  Health Maintenance  Topic Date Due   Eye exam for diabetics  07/23/2018   Breast Cancer Screening  11/22/2020   Medicare Annual Wellness Visit  02/06/2024   COVID-19 Vaccine (6 - 2025-26 season) 03/14/2024   Kidney health urinalysis for diabetes  07/05/2024   Yearly kidney function blood test for diabetes  09/09/2024   Hemoglobin A1C  01/10/2025   Complete foot exam   07/25/2025   Pneumococcal Vaccine for age over 89  Completed   Flu Shot  Completed   Osteoporosis screening with Bone Density Scan  Completed   Zoster (Shingles) Vaccine  Completed   Meningitis B Vaccine  Aged Out   DTaP/Tdap/Td vaccine  Discontinued    Vision: Annual vision screenings are recommended for early detection of glaucoma, cataracts, and diabetic retinopathy. These exams can also reveal signs of chronic conditions such as diabetes and high blood pressure.  Dental: Annual dental screenings help detect early signs of oral cancer, gum disease, and other conditions linked to overall health, including heart disease and diabetes.  Please see the attached documents for additional  preventive care recommendations.   NEXT AWV 08/10/25 @ 10:50 AM IN PERSON

## 2024-08-11 ENCOUNTER — Other Ambulatory Visit

## 2024-08-11 ENCOUNTER — Encounter

## 2024-08-19 ENCOUNTER — Encounter

## 2024-08-19 ENCOUNTER — Other Ambulatory Visit

## 2024-10-27 ENCOUNTER — Ambulatory Visit: Admitting: Podiatry

## 2025-01-11 ENCOUNTER — Ambulatory Visit: Admitting: Family Medicine

## 2025-08-10 ENCOUNTER — Ambulatory Visit
